# Patient Record
Sex: Female | Born: 1985
Health system: Southern US, Community
[De-identification: ages and names within clinical notes are randomized; demographics above are authoritative.]

## PROBLEM LIST (undated history)

## (undated) DIAGNOSIS — R000 Tachycardia, unspecified: Secondary | ICD-10-CM

## (undated) DIAGNOSIS — K219 Gastro-esophageal reflux disease without esophagitis: Secondary | ICD-10-CM

## (undated) DIAGNOSIS — L309 Dermatitis, unspecified: Secondary | ICD-10-CM

## (undated) DIAGNOSIS — D649 Anemia, unspecified: Secondary | ICD-10-CM

## (undated) DIAGNOSIS — I471 Supraventricular tachycardia, unspecified: Secondary | ICD-10-CM

## (undated) DIAGNOSIS — F32A Depression, unspecified: Secondary | ICD-10-CM

## (undated) DIAGNOSIS — N390 Urinary tract infection, site not specified: Secondary | ICD-10-CM

## (undated) DIAGNOSIS — K760 Fatty (change of) liver, not elsewhere classified: Secondary | ICD-10-CM

## (undated) DIAGNOSIS — O210 Mild hyperemesis gravidarum: Secondary | ICD-10-CM

## (undated) DIAGNOSIS — R5383 Other fatigue: Secondary | ICD-10-CM

## (undated) DIAGNOSIS — Z8781 Personal history of (healed) traumatic fracture: Secondary | ICD-10-CM

## (undated) DIAGNOSIS — F419 Anxiety disorder, unspecified: Secondary | ICD-10-CM

## (undated) DIAGNOSIS — F329 Major depressive disorder, single episode, unspecified: Secondary | ICD-10-CM

## (undated) HISTORY — PX: VEIN SURGERY: SHX48

## (undated) HISTORY — DX: Anxiety disorder, unspecified: F41.9

## (undated) HISTORY — PX: WISDOM TOOTH EXTRACTION: SHX21

## (undated) HISTORY — DX: Personal history of (healed) traumatic fracture: Z87.81

## (undated) HISTORY — PX: CHOLECYSTECTOMY: SHX55

## (undated) HISTORY — DX: Dermatitis, unspecified: L30.9

## (undated) HISTORY — DX: Mild hyperemesis gravidarum: O21.0

---

## 2006-07-19 ENCOUNTER — Ambulatory Visit (HOSPITAL_COMMUNITY): Admission: RE | Admit: 2006-07-19 | Discharge: 2006-07-19 | Payer: Self-pay | Admitting: Gynecology

## 2006-12-09 ENCOUNTER — Ambulatory Visit (HOSPITAL_COMMUNITY): Admission: RE | Admit: 2006-12-09 | Discharge: 2006-12-09 | Payer: Self-pay | Admitting: Family Medicine

## 2006-12-24 ENCOUNTER — Ambulatory Visit: Payer: Self-pay | Admitting: Obstetrics and Gynecology

## 2006-12-27 ENCOUNTER — Ambulatory Visit: Payer: Self-pay | Admitting: Physician Assistant

## 2006-12-27 ENCOUNTER — Inpatient Hospital Stay (HOSPITAL_COMMUNITY): Admission: AD | Admit: 2006-12-27 | Discharge: 2006-12-29 | Payer: Self-pay | Admitting: Obstetrics & Gynecology

## 2007-06-30 ENCOUNTER — Emergency Department (HOSPITAL_COMMUNITY): Admission: EM | Admit: 2007-06-30 | Discharge: 2007-06-30 | Payer: Self-pay | Admitting: Emergency Medicine

## 2007-07-29 ENCOUNTER — Emergency Department (HOSPITAL_COMMUNITY): Admission: EM | Admit: 2007-07-29 | Discharge: 2007-07-29 | Payer: Self-pay | Admitting: Emergency Medicine

## 2008-08-24 ENCOUNTER — Emergency Department (HOSPITAL_BASED_OUTPATIENT_CLINIC_OR_DEPARTMENT_OTHER): Admission: EM | Admit: 2008-08-24 | Discharge: 2008-08-24 | Payer: Self-pay | Admitting: Emergency Medicine

## 2010-06-25 ENCOUNTER — Inpatient Hospital Stay (INDEPENDENT_AMBULATORY_CARE_PROVIDER_SITE_OTHER)
Admission: RE | Admit: 2010-06-25 | Discharge: 2010-06-25 | Disposition: A | Discharge status: Home / Self Care | Payer: Medicaid Other | Source: Ambulatory Visit | Attending: Family Medicine | Admitting: Family Medicine

## 2010-06-25 DIAGNOSIS — B2790 Infectious mononucleosis, unspecified without complication: Secondary | ICD-10-CM

## 2010-08-13 LAB — GLUCOSE, CAPILLARY: Glucose-Capillary: 89 mg/dL (ref 70–99)

## 2010-09-08 ENCOUNTER — Other Ambulatory Visit: Payer: Self-pay | Admitting: Family Medicine

## 2010-09-08 ENCOUNTER — Ambulatory Visit
Admission: RE | Admit: 2010-09-08 | Discharge: 2010-09-08 | Disposition: A | Discharge status: Home / Self Care | Payer: Medicaid Other | Source: Ambulatory Visit | Attending: Family Medicine | Admitting: Family Medicine

## 2010-09-08 DIAGNOSIS — R52 Pain, unspecified: Secondary | ICD-10-CM

## 2010-10-03 DIAGNOSIS — Z8781 Personal history of (healed) traumatic fracture: Secondary | ICD-10-CM

## 2010-10-03 HISTORY — DX: Personal history of (healed) traumatic fracture: Z87.81

## 2011-01-23 LAB — BASIC METABOLIC PANEL
CO2: 27
Calcium: 10
Glucose, Bld: 88
Potassium: 4.5
Sodium: 140

## 2011-01-23 LAB — CBC
HCT: 40.9
Hemoglobin: 13.9
MCHC: 34
RDW: 13.2

## 2011-01-23 LAB — DIFFERENTIAL
Basophils Absolute: 0.1
Basophils Relative: 1
Eosinophils Absolute: 0.3
Eosinophils Relative: 4
Monocytes Absolute: 0.7

## 2011-01-26 LAB — D-DIMER, QUANTITATIVE

## 2011-02-06 LAB — ABO/RH

## 2011-02-06 LAB — GC/CHLAMYDIA PROBE AMP, GENITAL
Chlamydia: NEGATIVE
Gonorrhea: NEGATIVE

## 2011-02-06 LAB — HIV ANTIBODY (ROUTINE TESTING W REFLEX): HIV: NONREACTIVE

## 2011-02-13 LAB — RH IMMUNE GLOB WKUP(>/=20WKS)(NOT WOMEN'S HOSP): Fetal Screen: NEGATIVE

## 2011-02-13 LAB — CBC
MCHC: 34.1
Platelets: 250
RDW: 14.4 — ABNORMAL HIGH
RDW: 15 — ABNORMAL HIGH

## 2011-02-13 LAB — RPR: RPR Ser Ql: NONREACTIVE

## 2011-02-13 LAB — CCBB MATERNAL DONOR DRAW

## 2011-02-13 LAB — RAPID HIV SCREEN (WH-MAU): Rapid HIV Screen: NONREACTIVE

## 2011-05-05 NOTE — L&D Delivery Note (Signed)
Delivery Note At 2:45 PM a viable female was delivered via Vaginal, Spontaneous Delivery (Presentation: Right Occiput Anterior).  APGARs and weight pending.   Placenta status: Intact, Spontaneous.  Cord:  with the following complications: None.  Anesthesia: Epidural  Episiotomy: None Lacerations: 1st degree Suture Repair: 3.0 vicryl rapide Est. Blood Loss (mL): 400  Mom to postpartum.  Baby to stay with mom.  Gunnar Hereford D 08/31/2011, 3:05 PM

## 2011-08-10 LAB — STREP B DNA PROBE: GBS: NEGATIVE

## 2011-08-20 ENCOUNTER — Encounter (HOSPITAL_COMMUNITY): Payer: Self-pay | Admitting: *Deleted

## 2011-08-20 ENCOUNTER — Telehealth (HOSPITAL_COMMUNITY): Payer: Self-pay | Admitting: *Deleted

## 2011-08-20 NOTE — Telephone Encounter (Signed)
Preadmission screen  

## 2011-08-22 ENCOUNTER — Inpatient Hospital Stay (HOSPITAL_COMMUNITY)
Admission: AD | Admit: 2011-08-22 | Discharge: 2011-08-22 | Disposition: A | Discharge status: Home / Self Care | Payer: Self-pay | Source: Ambulatory Visit | Attending: Obstetrics and Gynecology | Admitting: Obstetrics and Gynecology

## 2011-08-22 ENCOUNTER — Encounter (HOSPITAL_COMMUNITY): Payer: Self-pay

## 2011-08-22 DIAGNOSIS — O479 False labor, unspecified: Secondary | ICD-10-CM | POA: Insufficient documentation

## 2011-08-22 NOTE — Progress Notes (Signed)
Dr. Senaida Ores notified of patient arrival for labor eval. Pt was seen in the office and cervical exam was 1.5cm. Pts cervix is unchanged 1.5 cm, 50, -3. Orders received to dc patient home.

## 2011-08-22 NOTE — MAU Note (Signed)
Irregular contractions since today, regular since 4:00 every 5 minutes, thinks lost mucus plug, no vaginal bleeding no leaking of fluid.  38 weeks. G2P1

## 2011-08-31 ENCOUNTER — Encounter (HOSPITAL_COMMUNITY): Payer: Self-pay

## 2011-08-31 ENCOUNTER — Inpatient Hospital Stay (HOSPITAL_COMMUNITY)
Admission: RE | Admit: 2011-08-31 | Discharge: 2011-09-02 | DRG: 775 | Disposition: A | Discharge status: Home / Self Care | Payer: Managed Care, Other (non HMO) | Source: Ambulatory Visit | Attending: Obstetrics and Gynecology | Admitting: Obstetrics and Gynecology

## 2011-08-31 ENCOUNTER — Encounter (HOSPITAL_COMMUNITY): Payer: Self-pay | Admitting: Anesthesiology

## 2011-08-31 ENCOUNTER — Inpatient Hospital Stay (HOSPITAL_COMMUNITY): Payer: Managed Care, Other (non HMO) | Admitting: Anesthesiology

## 2011-08-31 VITALS — BP 110/69 | HR 87 | Temp 98.2°F | Resp 18 | Ht 67.0 in | Wt 208.0 lb

## 2011-08-31 DIAGNOSIS — Z3490 Encounter for supervision of normal pregnancy, unspecified, unspecified trimester: Secondary | ICD-10-CM | POA: Diagnosis present

## 2011-08-31 DIAGNOSIS — F411 Generalized anxiety disorder: Secondary | ICD-10-CM | POA: Diagnosis present

## 2011-08-31 DIAGNOSIS — O99344 Other mental disorders complicating childbirth: Secondary | ICD-10-CM | POA: Diagnosis present

## 2011-08-31 LAB — CBC
HCT: 29.9 % — ABNORMAL LOW (ref 36.0–46.0)
Hemoglobin: 9.3 g/dL — ABNORMAL LOW (ref 12.0–15.0)
MCH: 26.6 pg (ref 26.0–34.0)
MCHC: 31.1 g/dL (ref 30.0–36.0)
MCV: 85.4 fL (ref 78.0–100.0)

## 2011-08-31 MED ORDER — WITCH HAZEL-GLYCERIN EX PADS: 1.0000 "application " | MEDICATED_PAD | CUTANEOUS | Status: DC | PRN

## 2011-08-31 MED ORDER — OXYTOCIN 20 UNITS IN LACTATED RINGERS INFUSION - SIMPLE: 125.0000 mL/h | INTRAVENOUS | Status: DC | PRN

## 2011-08-31 MED ORDER — ONDANSETRON HCL 4 MG/2ML IJ SOLN: 4.0000 mg | Freq: Four times a day (QID) | INTRAMUSCULAR | Status: DC | PRN

## 2011-08-31 MED ORDER — LACTATED RINGERS IV SOLN
INTRAVENOUS | Status: DC
  Administered 2011-08-31: 09:00:00 via INTRAVENOUS

## 2011-08-31 MED ORDER — TERBUTALINE SULFATE 1 MG/ML IJ SOLN: 0.2500 mg | Freq: Once | INTRAMUSCULAR | Status: DC | PRN

## 2011-08-31 MED ORDER — METHYLERGONOVINE MALEATE 0.2 MG PO TABS: 0.2000 mg | ORAL_TABLET | ORAL | Status: DC | PRN

## 2011-08-31 MED ORDER — FENTANYL 2.5 MCG/ML BUPIVACAINE 1/10 % EPIDURAL INFUSION (WH - ANES)
INTRAMUSCULAR | Status: DC | PRN
  Administered 2011-08-31: 14 mL/h via EPIDURAL

## 2011-08-31 MED ORDER — ONDANSETRON HCL 4 MG PO TABS: 4.0000 mg | ORAL_TABLET | ORAL | Status: DC | PRN

## 2011-08-31 MED ORDER — DIPHENHYDRAMINE HCL 25 MG PO CAPS: 25.0000 mg | ORAL_CAPSULE | Freq: Four times a day (QID) | ORAL | Status: DC | PRN

## 2011-08-31 MED ORDER — ONDANSETRON HCL 4 MG/2ML IJ SOLN: 4.0000 mg | INTRAMUSCULAR | Status: DC | PRN

## 2011-08-31 MED ORDER — PRENATAL MULTIVITAMIN CH
1.0000 | ORAL_TABLET | Freq: Every day | ORAL | Status: DC
  Administered 2011-09-01 – 2011-09-02 (×2): 1 via ORAL
  Filled 2011-08-31 (×2): qty 1

## 2011-08-31 MED ORDER — OXYTOCIN 20 UNITS IN LACTATED RINGERS INFUSION - SIMPLE
1.0000 m[IU]/min | INTRAVENOUS | Status: DC
  Administered 2011-08-31: 6 m[IU]/min via INTRAVENOUS
  Filled 2011-08-31: qty 1000

## 2011-08-31 MED ORDER — OXYTOCIN 20 UNITS IN LACTATED RINGERS INFUSION - SIMPLE: 125.0000 mL/h | Freq: Once | INTRAVENOUS | Status: DC

## 2011-08-31 MED ORDER — PHENYLEPHRINE 40 MCG/ML (10ML) SYRINGE FOR IV PUSH (FOR BLOOD PRESSURE SUPPORT)
80.0000 ug | PREFILLED_SYRINGE | INTRAVENOUS | Status: DC | PRN
  Filled 2011-08-31: qty 5

## 2011-08-31 MED ORDER — TETANUS-DIPHTH-ACELL PERTUSSIS 5-2.5-18.5 LF-MCG/0.5 IM SUSP: 0.5000 mL | Freq: Once | INTRAMUSCULAR | Status: DC

## 2011-08-31 MED ORDER — MEASLES, MUMPS & RUBELLA VAC ~~LOC~~ INJ: 0.5000 mL | INJECTION | Freq: Once | SUBCUTANEOUS | Status: DC

## 2011-08-31 MED ORDER — PHENYLEPHRINE 40 MCG/ML (10ML) SYRINGE FOR IV PUSH (FOR BLOOD PRESSURE SUPPORT): 80.0000 ug | PREFILLED_SYRINGE | INTRAVENOUS | Status: DC | PRN

## 2011-08-31 MED ORDER — BENZOCAINE-MENTHOL 20-0.5 % EX AERO
1.0000 "application " | INHALATION_SPRAY | CUTANEOUS | Status: DC | PRN
  Administered 2011-08-31 – 2011-09-01 (×2): 1 via TOPICAL
  Filled 2011-08-31 (×2): qty 56

## 2011-08-31 MED ORDER — EPHEDRINE 5 MG/ML INJ: 10.0000 mg | INTRAVENOUS | Status: DC | PRN

## 2011-08-31 MED ORDER — SIMETHICONE 80 MG PO CHEW: 80.0000 mg | CHEWABLE_TABLET | ORAL | Status: DC | PRN

## 2011-08-31 MED ORDER — LACTATED RINGERS IV SOLN: 500.0000 mL | INTRAVENOUS | Status: DC | PRN

## 2011-08-31 MED ORDER — LANOLIN HYDROUS EX OINT: TOPICAL_OINTMENT | CUTANEOUS | Status: DC | PRN

## 2011-08-31 MED ORDER — IBUPROFEN 600 MG PO TABS
600.0000 mg | ORAL_TABLET | Freq: Four times a day (QID) | ORAL | Status: DC
  Administered 2011-08-31 – 2011-09-02 (×6): 600 mg via ORAL
  Filled 2011-08-31 (×7): qty 1

## 2011-08-31 MED ORDER — LIDOCAINE HCL (PF) 1 % IJ SOLN
INTRAMUSCULAR | Status: DC | PRN
  Administered 2011-08-31 (×2): 4 mL

## 2011-08-31 MED ORDER — DIBUCAINE 1 % RE OINT: 1.0000 "application " | TOPICAL_OINTMENT | RECTAL | Status: DC | PRN

## 2011-08-31 MED ORDER — EPHEDRINE 5 MG/ML INJ
10.0000 mg | INTRAVENOUS | Status: DC | PRN
  Filled 2011-08-31: qty 4

## 2011-08-31 MED ORDER — OXYTOCIN BOLUS FROM INFUSION
500.0000 mL | Freq: Once | INTRAVENOUS | Status: DC
  Filled 2011-08-31: qty 500

## 2011-08-31 MED ORDER — CITRIC ACID-SODIUM CITRATE 334-500 MG/5ML PO SOLN: 30.0000 mL | ORAL | Status: DC | PRN

## 2011-08-31 MED ORDER — SERTRALINE HCL 100 MG PO TABS
100.0000 mg | ORAL_TABLET | Freq: Every day | ORAL | Status: DC
  Administered 2011-08-31: 100 mg via ORAL
  Filled 2011-08-31 (×2): qty 1

## 2011-08-31 MED ORDER — IBUPROFEN 600 MG PO TABS
600.0000 mg | ORAL_TABLET | Freq: Four times a day (QID) | ORAL | Status: DC | PRN
  Administered 2011-08-31: 600 mg via ORAL
  Filled 2011-08-31: qty 1

## 2011-08-31 MED ORDER — OXYCODONE-ACETAMINOPHEN 5-325 MG PO TABS
1.0000 | ORAL_TABLET | ORAL | Status: DC | PRN
  Administered 2011-08-31 – 2011-09-01 (×4): 1 via ORAL
  Administered 2011-09-02: 2 via ORAL
  Filled 2011-08-31 (×4): qty 1
  Filled 2011-08-31: qty 2

## 2011-08-31 MED ORDER — FENTANYL 2.5 MCG/ML BUPIVACAINE 1/10 % EPIDURAL INFUSION (WH - ANES)
14.0000 mL/h | INTRAMUSCULAR | Status: DC
  Administered 2011-08-31: 14 mL/h via EPIDURAL
  Filled 2011-08-31 (×2): qty 60

## 2011-08-31 MED ORDER — ACETAMINOPHEN 325 MG PO TABS: 650.0000 mg | ORAL_TABLET | ORAL | Status: DC | PRN

## 2011-08-31 MED ORDER — ZOLPIDEM TARTRATE 5 MG PO TABS
5.0000 mg | ORAL_TABLET | Freq: Every evening | ORAL | Status: DC | PRN
Start: 2011-08-31 — End: 2011-09-02

## 2011-08-31 MED ORDER — DIPHENHYDRAMINE HCL 50 MG/ML IJ SOLN: 12.5000 mg | INTRAMUSCULAR | Status: DC | PRN

## 2011-08-31 MED ORDER — SERTRALINE HCL 100 MG PO TABS
100.0000 mg | ORAL_TABLET | Freq: Every day | ORAL | Status: DC
  Administered 2011-09-01 – 2011-09-02 (×2): 100 mg via ORAL
  Filled 2011-08-31 (×3): qty 1

## 2011-08-31 MED ORDER — METHYLERGONOVINE MALEATE 0.2 MG/ML IJ SOLN: 0.2000 mg | INTRAMUSCULAR | Status: DC | PRN

## 2011-08-31 MED ORDER — OXYCODONE-ACETAMINOPHEN 5-325 MG PO TABS: 1.0000 | ORAL_TABLET | ORAL | Status: DC | PRN

## 2011-08-31 MED ORDER — MAGNESIUM HYDROXIDE 400 MG/5ML PO SUSP: 30.0000 mL | ORAL | Status: DC | PRN

## 2011-08-31 MED ORDER — LIDOCAINE HCL (PF) 1 % IJ SOLN: 30.0000 mL | INTRAMUSCULAR | Status: DC | PRN

## 2011-08-31 MED ORDER — LACTATED RINGERS IV SOLN: 500.0000 mL | Freq: Once | INTRAVENOUS | Status: DC

## 2011-08-31 MED ORDER — SENNOSIDES-DOCUSATE SODIUM 8.6-50 MG PO TABS
2.0000 | ORAL_TABLET | Freq: Every day | ORAL | Status: DC
  Administered 2011-08-31 – 2011-09-01 (×2): 2 via ORAL

## 2011-08-31 NOTE — Anesthesia Procedure Notes (Signed)
Epidural Patient location during procedure: OB Start time: 08/31/2011 10:12 AM  Staffing Anesthesiologist: Kamdyn Covel A. Performed by: anesthesiologist   Preanesthetic Checklist Completed: patient identified, site marked, surgical consent, pre-op evaluation, timeout performed, IV checked, risks and benefits discussed and monitors and equipment checked  Epidural Patient position: sitting Prep: site prepped and draped and DuraPrep Patient monitoring: continuous pulse ox and blood pressure Approach: midline Injection technique: LOR air  Needle:  Needle type: Tuohy  Needle gauge: 17 G Needle length: 9 cm Needle insertion depth: 5 cm cm Catheter type: closed end flexible Catheter size: 19 Gauge Catheter at skin depth: 10 cm Test dose: negative and Other  Assessment Events: blood not aspirated, injection not painful, no injection resistance, negative IV test and no paresthesia  Additional Notes Patient identified. Risks and benefits discussed including failed block, incomplete  Pain control, post dural puncture headache, nerve damage, paralysis, blood pressure Changes, nausea, vomiting, reactions to medications-both toxic and allergic and post Partum back pain. All questions were answered. Patient expressed understanding and wished to proceed. Sterile technique was used throughout procedure. Epidural site was Dressed with sterile barrier dressing. No paresthesias, signs of intravascular injection Or signs of intrathecal spread were encountered.  Patient was more comfortable after the epidural was dosed. Please see RN's note for documentation of vital signs and FHR which are stable.

## 2011-08-31 NOTE — Progress Notes (Signed)
Comfortable with epidural Afeb, VSS FHT- Cat I, ctx q 2-3 min VE- 6/80/-1, vtx Continue pitocin, anticipate SVD
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
## 2011-08-31 NOTE — Anesthesia Preprocedure Evaluation (Signed)

## 2011-09-01 NOTE — Progress Notes (Signed)
PPD #1 No problems Afeb, VSS Fundus firm, NT at U-0 Continue routine postpartum care, wants to go home later today if baby can go

## 2011-09-01 NOTE — Progress Notes (Signed)
UR chart review completed.  

## 2011-09-02 MED ORDER — IBUPROFEN 600 MG PO TABS: 600.0000 mg | ORAL_TABLET | Freq: Four times a day (QID) | ORAL | Status: AC

## 2011-09-02 MED ORDER — OXYCODONE-ACETAMINOPHEN 5-325 MG PO TABS: 1.0000 | ORAL_TABLET | ORAL | Status: AC | PRN

## 2011-09-02 NOTE — Discharge Summary (Signed)
Obstetric Discharge Summary Reason for Admission: induction of labor Prenatal Procedures: none Intrapartum Procedures: spontaneous vaginal delivery Postpartum Procedures: none Complications-Operative and Postpartum: 1st degree perineal laceration Hemoglobin  Date Value Range Status  08/31/2011 9.3* 12.0-15.0 (g/dL) Final     HCT  Date Value Range Status  08/31/2011 29.9* 36.0-46.0 (%) Final    Physical Exam:  General: alert Lochia: appropriate Uterine Fundus: firm  Discharge Diagnoses: Term Pregnancy-delivered  Discharge Information: Date: 09/02/2011 Activity: pelvic rest Diet: routine Medications: Ibuprofen and Percocet Condition: stable Instructions: refer to practice specific booklet Discharge to: home Follow-up Information    Follow up with Reeder Brisby D, MD. Schedule an appointment as soon as possible for a visit in 6 weeks.   Contact information:   [address] [place] [address] [phone]          Newborn Data: Live born female  Birth Weight: 7 lb 2 oz (3232 g) APGAR: 9, 9  Home with mother.  Raylynne Cubbage D 09/02/2011, 8:50 AM

## 2011-09-02 NOTE — Discharge Instructions (Signed)
As per discharge pamphlet °

## 2011-09-02 NOTE — Anesthesia Postprocedure Evaluation (Signed)
  Anesthesia Post-op Note  Patient: Christina [name]  Procedure(s) Performed: * Lumbar Epidural for L&D*  Patient Location: Mother/Baby  Anesthesia Type: Epidural  Level of Consciousness: awake, alert  and oriented  Airway and Oxygen Therapy: Patient Spontanous Breathing  Post-op Pain: none  Post-op Assessment: Post-op Vital signs reviewed, Patient's Cardiovascular Status Stable, Respiratory Function Stable, Patent Airway, No signs of Nausea or vomiting, Adequate PO intake, Pain level controlled, No headache, No backache, No residual numbness and No residual motor weakness  Post-op Vital Signs: Reviewed and stable  Complications: No apparent anesthesia complications

## 2011-09-02 NOTE — Progress Notes (Signed)
PPD #2 No problems Afeb, VSS Fundus firm, NT at U-1 D/c home 
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
## 2012-06-06 ENCOUNTER — Encounter (HOSPITAL_COMMUNITY): Payer: Self-pay | Admitting: Pharmacist

## 2012-06-14 ENCOUNTER — Encounter (HOSPITAL_COMMUNITY): Payer: Self-pay

## 2012-06-14 ENCOUNTER — Encounter (HOSPITAL_COMMUNITY)
Admission: RE | Admit: 2012-06-14 | Discharge: 2012-06-14 | Disposition: A | Discharge status: Home / Self Care | Payer: BC Managed Care – PPO | Source: Ambulatory Visit | Attending: Obstetrics and Gynecology | Admitting: Obstetrics and Gynecology

## 2012-06-14 HISTORY — DX: Gastro-esophageal reflux disease without esophagitis: K21.9

## 2012-06-14 LAB — CBC
HCT: 40.4 % (ref 36.0–46.0)
MCHC: 32.4 g/dL (ref 30.0–36.0)
MCV: 91 fL (ref 78.0–100.0)
RDW: 13.2 % (ref 11.5–15.5)

## 2012-06-14 NOTE — Patient Instructions (Addendum)
20 Christina [name]  06/14/2012   Your procedure is scheduled on:  06/20/12  Enter through the Main Entrance of [facility] at 6 AM.  Pick up the phone at the desk and dial [phone].   Call this number if you have problems the morning of surgery: [phone]   Remember:   Do not eat food:After Midnight.  Do not drink clear liquids: After Midnight.  Take these medicines the morning of surgery with A SIP OF WATER: NA   Do not wear jewelry, make-up or nail polish.  Do not wear lotions, powders, or perfumes. You may wear deodorant.  Do not shave 48 hours prior to surgery.  Do not bring valuables to the hospital.  Contacts, dentures or bridgework may not be worn into surgery.  Leave suitcase in the car. After surgery it may be brought to your room.  For patients admitted to the hospital, checkout time is 11:00 AM the day of discharge.   Patients discharged the day of surgery will not be allowed to drive home.  Name and phone number of your driver: husband   Christina [name]  Special Instructions: Shower using CHG 2 nights before surgery and the night before surgery.  If you shower the day of surgery use CHG.  Use special wash - you have one bottle of CHG for all showers.  You should use approximately 1/3 of the bottle for each shower.   Please read over the following fact sheets that you were given: MRSA Information

## 2012-06-14 NOTE — Pre-Procedure Instructions (Signed)
Patient has temporary crown on lower right.

## 2012-06-20 ENCOUNTER — Ambulatory Visit (HOSPITAL_COMMUNITY)
Admission: RE | Admit: 2012-06-20 | Discharge: 2012-06-20 | Disposition: A | Discharge status: Home / Self Care | Payer: BC Managed Care – PPO | Source: Ambulatory Visit | Attending: Obstetrics and Gynecology | Admitting: Obstetrics and Gynecology

## 2012-06-20 ENCOUNTER — Ambulatory Visit (HOSPITAL_COMMUNITY): Payer: BC Managed Care – PPO | Admitting: Anesthesiology

## 2012-06-20 ENCOUNTER — Encounter (HOSPITAL_COMMUNITY): Payer: Self-pay | Admitting: Anesthesiology

## 2012-06-20 ENCOUNTER — Encounter (HOSPITAL_COMMUNITY)
Admission: RE | Disposition: A | Discharge status: Home / Self Care | Payer: Self-pay | Source: Ambulatory Visit | Attending: Obstetrics and Gynecology

## 2012-06-20 DIAGNOSIS — N83209 Unspecified ovarian cyst, unspecified side: Secondary | ICD-10-CM | POA: Insufficient documentation

## 2012-06-20 DIAGNOSIS — N949 Unspecified condition associated with female genital organs and menstrual cycle: Secondary | ICD-10-CM | POA: Insufficient documentation

## 2012-06-20 HISTORY — PX: LAPAROSCOPY: SHX197

## 2012-06-20 LAB — HCG, SERUM, QUALITATIVE: Preg, Serum: NEGATIVE

## 2012-06-20 SURGERY — LAPAROSCOPY OPERATIVE
Anesthesia: General | Site: Abdomen | Laterality: Left | Wound class: Clean Contaminated

## 2012-06-20 MED ORDER — GLYCOPYRROLATE 0.2 MG/ML IJ SOLN
INTRAMUSCULAR | Status: AC
  Filled 2012-06-20: qty 4

## 2012-06-20 MED ORDER — FENTANYL CITRATE 0.05 MG/ML IJ SOLN
INTRAMUSCULAR | Status: AC
  Filled 2012-06-20: qty 2

## 2012-06-20 MED ORDER — BUPIVACAINE HCL (PF) 0.25 % IJ SOLN
INTRAMUSCULAR | Status: AC
  Filled 2012-06-20: qty 30

## 2012-06-20 MED ORDER — FENTANYL CITRATE 0.05 MG/ML IJ SOLN
INTRAMUSCULAR | Status: DC | PRN
  Administered 2012-06-20: 100 ug via INTRAVENOUS
  Administered 2012-06-20 (×2): 50 ug via INTRAVENOUS

## 2012-06-20 MED ORDER — LIDOCAINE HCL (CARDIAC) 20 MG/ML IV SOLN
INTRAVENOUS | Status: AC
  Filled 2012-06-20: qty 5

## 2012-06-20 MED ORDER — PROPOFOL 10 MG/ML IV EMUL
INTRAVENOUS | Status: AC
  Filled 2012-06-20: qty 20

## 2012-06-20 MED ORDER — GLYCOPYRROLATE 0.2 MG/ML IJ SOLN
INTRAMUSCULAR | Status: DC | PRN
  Administered 2012-06-20: .8 mg via INTRAVENOUS

## 2012-06-20 MED ORDER — PHENYLEPHRINE HCL 10 MG/ML IJ SOLN
INTRAMUSCULAR | Status: DC | PRN
  Administered 2012-06-20: 80 ug via INTRAVENOUS

## 2012-06-20 MED ORDER — MIDAZOLAM HCL 2 MG/2ML IJ SOLN
INTRAMUSCULAR | Status: AC
  Filled 2012-06-20: qty 2

## 2012-06-20 MED ORDER — NEOSTIGMINE METHYLSULFATE 1 MG/ML IJ SOLN
INTRAMUSCULAR | Status: DC | PRN
  Administered 2012-06-20: 4 mg via INTRAVENOUS

## 2012-06-20 MED ORDER — ROCURONIUM BROMIDE 100 MG/10ML IV SOLN
INTRAVENOUS | Status: DC | PRN
  Administered 2012-06-20: 10 mg via INTRAVENOUS
  Administered 2012-06-20: 40 mg via INTRAVENOUS

## 2012-06-20 MED ORDER — FENTANYL CITRATE 0.05 MG/ML IJ SOLN
25.0000 ug | INTRAMUSCULAR | Status: DC | PRN
  Administered 2012-06-20 (×3): 50 ug via INTRAVENOUS

## 2012-06-20 MED ORDER — LACTATED RINGERS IV SOLN
INTRAVENOUS | Status: DC
  Administered 2012-06-20: 07:00:00 via INTRAVENOUS

## 2012-06-20 MED ORDER — PHENYLEPHRINE 40 MCG/ML (10ML) SYRINGE FOR IV PUSH (FOR BLOOD PRESSURE SUPPORT)
PREFILLED_SYRINGE | INTRAVENOUS | Status: AC
  Filled 2012-06-20: qty 5

## 2012-06-20 MED ORDER — OXYCODONE-ACETAMINOPHEN 5-325 MG PO TABS: 1.0000 | ORAL_TABLET | ORAL | Status: DC | PRN

## 2012-06-20 MED ORDER — ONDANSETRON HCL 4 MG/2ML IJ SOLN
INTRAMUSCULAR | Status: DC | PRN
  Administered 2012-06-20: 4 mg via INTRAVENOUS

## 2012-06-20 MED ORDER — ONDANSETRON HCL 4 MG/2ML IJ SOLN
INTRAMUSCULAR | Status: AC
  Filled 2012-06-20: qty 2

## 2012-06-20 MED ORDER — BUPIVACAINE HCL (PF) 0.25 % IJ SOLN
INTRAMUSCULAR | Status: DC | PRN
  Administered 2012-06-20: 10 mL

## 2012-06-20 MED ORDER — INDIGOTINDISULFONATE SODIUM 8 MG/ML IJ SOLN
INTRAMUSCULAR | Status: AC
  Filled 2012-06-20: qty 5

## 2012-06-20 MED ORDER — FENTANYL CITRATE 0.05 MG/ML IJ SOLN
INTRAMUSCULAR | Status: AC
  Filled 2012-06-20: qty 5

## 2012-06-20 MED ORDER — DEXAMETHASONE SODIUM PHOSPHATE 10 MG/ML IJ SOLN
INTRAMUSCULAR | Status: AC
  Filled 2012-06-20: qty 1

## 2012-06-20 MED ORDER — DEXAMETHASONE SODIUM PHOSPHATE 4 MG/ML IJ SOLN
INTRAMUSCULAR | Status: DC | PRN
  Administered 2012-06-20: 10 mg via INTRAVENOUS

## 2012-06-20 MED ORDER — NEOSTIGMINE METHYLSULFATE 1 MG/ML IJ SOLN
INTRAMUSCULAR | Status: AC
  Filled 2012-06-20: qty 1

## 2012-06-20 MED ORDER — KETOROLAC TROMETHAMINE 30 MG/ML IJ SOLN
INTRAMUSCULAR | Status: AC
Start: 2012-06-20 — End: 2012-06-20
  Filled 2012-06-20: qty 1

## 2012-06-20 MED ORDER — LIDOCAINE HCL (CARDIAC) 20 MG/ML IV SOLN
INTRAVENOUS | Status: DC | PRN
  Administered 2012-06-20: 80 mg via INTRAVENOUS

## 2012-06-20 MED ORDER — PROPOFOL 10 MG/ML IV BOLUS
INTRAVENOUS | Status: DC | PRN
  Administered 2012-06-20: 150 mg via INTRAVENOUS

## 2012-06-20 MED ORDER — MIDAZOLAM HCL 5 MG/5ML IJ SOLN
INTRAMUSCULAR | Status: DC | PRN
  Administered 2012-06-20: 2 mg via INTRAVENOUS

## 2012-06-20 MED ORDER — LACTATED RINGERS IV SOLN
INTRAVENOUS | Status: DC
  Administered 2012-06-20 (×3): via INTRAVENOUS

## 2012-06-20 MED ORDER — KETOROLAC TROMETHAMINE 30 MG/ML IJ SOLN
INTRAMUSCULAR | Status: DC | PRN
  Administered 2012-06-20: 30 mg via INTRAVENOUS

## 2012-06-20 SURGICAL SUPPLY — 31 items
ADH SKN CLS APL DERMABOND .7 (GAUZE/BANDAGES/DRESSINGS) ×1
BAG SPEC RTRVL LRG 6X4 10 (ENDOMECHANICALS)
CABLE HIGH FREQUENCY MONO STRZ (ELECTRODE) ×1 IMPLANT
CATH ROBINSON RED A/P 16FR (CATHETERS) IMPLANT
CHLORAPREP W/TINT 26ML (MISCELLANEOUS) ×2 IMPLANT
CLOTH BEACON ORANGE TIMEOUT ST (SAFETY) ×2 IMPLANT
DECANTER SPIKE VIAL GLASS SM (MISCELLANEOUS) ×2 IMPLANT
DERMABOND ADVANCED (GAUZE/BANDAGES/DRESSINGS) ×1
DERMABOND ADVANCED .7 DNX12 (GAUZE/BANDAGES/DRESSINGS) ×1 IMPLANT
GLOVE BIO SURGEON STRL SZ8 (GLOVE) ×1 IMPLANT
GLOVE ORTHO TXT STRL SZ7.5 (GLOVE) ×1 IMPLANT
GOWN PREVENTION PLUS LG XLONG (DISPOSABLE) ×6 IMPLANT
NDL EPID 17G 5 ECHO TUOHY (NEEDLE) IMPLANT
NEEDLE EPID 17G 5 ECHO TUOHY (NEEDLE) IMPLANT
NEEDLE INSUFFLATION 120MM (ENDOMECHANICALS) ×2 IMPLANT
NS IRRIG 1000ML POUR BTL (IV SOLUTION) ×1 IMPLANT
PACK LAPAROSCOPY BASIN (CUSTOM PROCEDURE TRAY) ×2 IMPLANT
POUCH SPECIMEN RETRIEVAL 10MM (ENDOMECHANICALS) IMPLANT
PROTECTOR NERVE ULNAR (MISCELLANEOUS) ×2 IMPLANT
SCALPEL HARMONIC ACE (MISCELLANEOUS) IMPLANT
SET IRRIG TUBING LAPAROSCOPIC (IRRIGATION / IRRIGATOR) IMPLANT
SLEEVE Z-THREAD 5X100MM (TROCAR) ×1 IMPLANT
SOLUTION ELECTROLUBE (MISCELLANEOUS) IMPLANT
SUT VICRYL 0 UR6 27IN ABS (SUTURE) ×3 IMPLANT
SUT VICRYL 4-0 PS2 18IN ABS (SUTURE) ×2 IMPLANT
TOWEL OR 17X24 6PK STRL BLUE (TOWEL DISPOSABLE) ×4 IMPLANT
TRAY FOLEY CATH 14FR (SET/KITS/TRAYS/PACK) ×1 IMPLANT
TROCAR XCEL NON-BLD 11X100MML (ENDOMECHANICALS) IMPLANT
TROCAR XCEL NON-BLD 5MMX100MML (ENDOMECHANICALS) ×2 IMPLANT
WARMER LAPAROSCOPE (MISCELLANEOUS) ×2 IMPLANT
WATER STERILE IRR 1000ML POUR (IV SOLUTION) ×2 IMPLANT

## 2012-06-20 NOTE — Op Note (Signed)
Preoperative diagnosis: Pelvic pain and left ovarian cyst Postoperative diagnosis: Same Procedure: Open laparoscopy with removal of portion of the left ovarian cyst wall Surgeon: Lavina Hamman M.D. Anesthesia: Gen. Endotracheal tube Findings: She had a normal abdomen and pelvis. Uterus and right tube and ovary were normal. Left ovary was enlarged with what appeared to be a simple cyst. The cyst drained clear yellow fluid and the inner portion of the cyst looked normal. She had no other obvious source for pelvic pain. Estimated blood loss: Minimal Specimens: Portion of left ovarian cyst wall Complications: None  Procedure in detail:  The patient was taken to the operating room and placed in the dorsosupine position. General anesthesia was induced and left arm was tucked her side. Legs were placed in mobile stirrups. Abdomen, perineum and vagina were then prepped and draped in the usual sterile fashion, bladder drained with a latex free catheter, Hulka tenaculum applied to the cervix for uterine manipulation once her pregnancy test was found to be negative. Her IUD appeared to be in normal position. Infraumbilical skin was then infiltrated with quarter percent Marcaine and a 3 cm horizontal incision was made. The fascia was grasped and elevated and entered sharply with scissors. Peritoneal cavity was entered bluntly. A pursestring suture of 0 Vicryl was placed around the fascia and the Hassan cannula was inserted and secured. CO2 was insufflated. Inspection with the laparoscope revealed good placement. Inspection of the pelvis revealed the above-mentioned findings. Using monopolar scissors an incision was made into the left ovarian cyst wall. The cyst draining clear yellow fluid. Using the scissors a 2 cm portion of the ovarian cyst wall was removed to prevent the cyst from reforming. This was hemostatic and again no other source for her pain was identified. A 5 mm port had been placed on the left side  under direct visualization to aid in manipulation. This trocar was removed at the end of the case also under direct visualization. The piece of the ovarian cyst was removed through the umbilical trocar as it was removed. All gas allowed to deflate from the abdomen as the umbilical trocar was removed. The previously placed pursestring suture was tied. This achieved good fascial closure. Skin incisions were closed with interrupted subcuticular sutures of 4-0 Vicryl followed by Dermabond. At the end of the case the Hulka tenaculum was removed and some bleeding was controlled with pressure. The Foley catheter was also removed. She was awakened and taken to the recovery room in stable condition after tolerating the procedure well. Counts were correct and she had PAS hose on throughout the procedure.

## 2012-06-20 NOTE — Transfer of Care (Signed)
Immediate Anesthesia Transfer of Care Note  Patient: Christina [name]  Procedure(s) Performed: Procedure(s) with comments: LAPAROSCOPY OPERATIVE (Left) - OPEN LAPAROSCOPY, removal of portion of left ovarian cyst wall  Patient Location: PACU  Anesthesia Type:General  Level of Consciousness: awake, alert  and oriented  Airway & Oxygen Therapy: Patient Spontanous Breathing and Patient connected to nasal cannula oxygen  Post-op Assessment: Report given to PACU RN and Post -op Vital signs reviewed and stable  Post vital signs: Reviewed and stable  Complications: No apparent anesthesia complications 

## 2012-06-20 NOTE — H&P (Signed)
Christina [name] is an 27 y.o. female. She has been followed for a 4 cm left ovarian cyst which has remained stable in size over the past year or so.  However, she is having increasing pelvic pain, L>R, and wishes to have laparoscopy to evaluate her pain and remove this cyst.  Pertinent Gynecological History: Last pap: normal Date: 02-2011 OB History: G2, P2002 SVD at term x 2, no complications   Menstrual History: No LMP recorded.    Past Medical History  Diagnosis Date  . History of hip fracture 10/2010    still has pain, right hip  . Anxiety   . Eczema   . Hyperemesis arising during pregnancy   . Vaginitis and vulvovaginitis   . GERD (gastroesophageal reflux disease)     tums when needed    Past Surgical History  Procedure Laterality Date  . Wisdom tooth extraction      Family History  Problem Relation Age of Onset  . Alcohol abuse Mother   . Anxiety disorder Mother   . Asthma Mother   . Mental illness Mother     bipolar  . Depression Mother   . Hypothyroidism Mother   . Ovarian cysts Mother   . Hypertension Father   . Endometriosis Sister   . Ovarian cysts Sister   . Alcohol abuse Brother   . Crohn's disease Brother   . Alcohol abuse Maternal Aunt   . Cancer Maternal Aunt     breast  . Hypothyroidism Maternal Aunt   . Miscarriages / Stillbirths Maternal Aunt     stillbirth  . Alcohol abuse Maternal Uncle   . Asthma Maternal Grandmother   . Mental illness Maternal Grandmother     bipolar  . Asthma Maternal Grandfather   . Diabetes Maternal Grandfather   . Hypertension Maternal Grandfather   . Asthma Paternal Grandmother   . Hypertension Paternal Grandmother   . Asthma Paternal Grandfather   . Diabetes Paternal Grandfather   . Heart disease Paternal Grandfather   . Hypertension Paternal Grandfather     Social History:  reports that she has never smoked. She does not have any smokeless tobacco history on file. She reports that she does not drink alcohol  or use illicit drugs.  Allergies:  Allergies  Allergen Reactions  . Latex Other (See Comments)    Itchy skin in elementary school    Prescriptions prior to admission  Medication Sig Dispense Refill  . acetaminophen (TYLENOL) 325 MG tablet Take 650 mg by mouth every 6 (six) hours as needed. For headache/pain      . busPIRone (BUSPAR) 5 MG tablet Take 7.5 mg by mouth 2 (two) times daily.      Bertram Gala Glycol-Propyl Glycol (SYSTANE OP) Apply 1-2 drops to eye as needed. For dry eyes      . Prenatal Vit-Fe Fumarate-FA (PRENATAL MULTIVITAMIN) TABS Take 1 tablet by mouth at bedtime.      . sertraline (ZOLOFT) 100 MG tablet Take 200 mg by mouth daily.         Review of Systems  Respiratory: Negative.   Cardiovascular: Negative.   Gastrointestinal: Negative.   Genitourinary: Negative.     Blood pressure 121/71, pulse 93, temperature 98.2 F (36.8 C), temperature source Oral, resp. rate 18, SpO2 98.00%, currently breastfeeding. Physical Exam  Constitutional: She appears well-developed and well-nourished.  Neck: Neck supple. No thyromegaly present.  Cardiovascular: Normal rate, regular rhythm and normal heart sounds.   No murmur heard. Respiratory: Breath sounds  normal. She is in respiratory distress. She has no wheezes.  GI: Soft. She exhibits no distension and no mass. There is no tenderness.  Genitourinary: Vagina normal.  Uterus midplanar, nontender, normal size IUD strings at 2-3 cm Mild bilateral adnexal tenderness, 4 cm cyst palpable on left    No results found for this or any previous visit (from the past 24 hour(s)).  No results found.  Assessment/Plan: Persistent 4 cm left ovarian cyst with increasing pelvic pain.  Medical and surgical options have been discussed, including continued observation, she wishes to proceed with laparoscopy.  The procedure, risks, alternatives and chances of relieving her pain have all been discussed.  Will proceed with open laparoscopy with  probable left ovarian cystectomy and evaluation/treatment for pain.  Christina [name] 06/20/2012, 7:02 AM

## 2012-06-20 NOTE — Anesthesia Procedure Notes (Signed)
Procedure Name: Intubation Date/Time: 06/20/2012 7:37 AM Performed by: Louanna Vanliew, Jannet Askew Pre-anesthesia Checklist: Patient identified, Patient being monitored, Emergency Drugs available, Timeout performed and Suction available Patient Re-evaluated:Patient Re-evaluated prior to inductionOxygen Delivery Method: Circle system utilized Preoxygenation: Pre-oxygenation with 100% oxygen Intubation Type: IV induction Ventilation: Mask ventilation without difficulty Laryngoscope Size: Mac and 3 Grade View: Grade I Tube type: Oral Tube size: 7.0 mm Number of attempts: 1 Secured at: 21 cm Dental Injury: Teeth and Oropharynx as per pre-operative assessment

## 2012-06-20 NOTE — Anesthesia Postprocedure Evaluation (Signed)
  Anesthesia Post Note  Patient: Christina [name]  Procedure(s) Performed: Procedure(s) (LRB): LAPAROSCOPY OPERATIVE (Left)  Anesthesia type: GA  Patient location: PACU  Post pain: Pain level controlled  Post assessment: Post-op Vital signs reviewed  Last Vitals:  Filed Vitals:   06/20/12 0930  BP: 95/79  Pulse: 82  Temp: 36.7 C  Resp: 12    Post vital signs: Reviewed  Level of consciousness: sedated  Complications: No apparent anesthesia complications

## 2012-06-20 NOTE — Anesthesia Preprocedure Evaluation (Addendum)
Anesthesia Evaluation  Patient identified by MRN, date of birth, ID band Patient awake    Reviewed: Allergy & Precautions, H&P , Patient's Chart, lab work & pertinent test results, reviewed documented beta blocker date and time   Airway Mallampati: II TM Distance: >3 FB Neck ROM: full    Dental no notable dental hx.    Pulmonary  breath sounds clear to auscultation  Pulmonary exam normal       Cardiovascular Rhythm:regular Rate:Normal     Neuro/Psych    GI/Hepatic GERD-  Controlled,  Endo/Other    Renal/GU      Musculoskeletal   Abdominal   Peds  Hematology   Anesthesia Other Findings   Reproductive/Obstetrics                          Anesthesia Physical Anesthesia Plan  ASA: II  Anesthesia Plan: General   Post-op Pain Management:    Induction: Intravenous  Airway Management Planned: Oral ETT  Additional Equipment:   Intra-op Plan:   Post-operative Plan:   Informed Consent: I have reviewed the patients History and Physical, chart, labs and discussed the procedure including the risks, benefits and alternatives for the proposed anesthesia with the patient or authorized representative who has indicated his/her understanding and acceptance.   Dental Advisory Given and Dental advisory given  Plan Discussed with: CRNA and Surgeon  Anesthesia Plan Comments: (  Discussed  general anesthesia, including possible nausea, instrumentation of airway, sore throat,pulmonary aspiration, etc. I asked if the were any outstanding questions, or  concerns before we proceeded. )        Anesthesia Quick Evaluation

## 2012-06-20 NOTE — Transfer of Care (Signed)
Immediate Anesthesia Transfer of Care Note  Patient: Christina [name]  Procedure(s) Performed: Procedure(s) with comments: LAPAROSCOPY OPERATIVE (Left) - OPEN LAPAROSCOPY, removal of portion of left ovarian cyst wall  Patient Location: PACU  Anesthesia Type:General  Level of Consciousness: awake, alert  and oriented  Airway & Oxygen Therapy: Patient Spontanous Breathing and Patient connected to nasal cannula oxygen  Post-op Assessment: Report given to PACU RN and Post -op Vital signs reviewed and stable  Post vital signs: Reviewed and stable  Complications: No apparent anesthesia complications

## 2012-06-20 NOTE — Interval H&P Note (Signed)
History and Physical Interval Note:  06/20/2012 7:13 AM  Christina [name]  has presented today for surgery, with the diagnosis of L-ovarian cyst [id number]  The various methods of treatment have been discussed with the patient and family. After consideration of risks, benefits and other options for treatment, the patient has consented to  Procedure(s) with comments: LAPAROSCOPY OPERATIVE (N/A) - OPEN LAPAROSCOPY, POSSIBLE L-OVARIAN CYSTECTOMY as a surgical intervention .  The patient's history has been reviewed, patient examined, no change in status, stable for surgery.  I have reviewed the patient's chart and labs.  Questions were answered to the patient's satisfaction.     Teresa Lemmerman D

## 2012-06-21 ENCOUNTER — Encounter (HOSPITAL_COMMUNITY): Payer: Self-pay | Admitting: Obstetrics and Gynecology

## 2012-06-21 MED FILL — Heparin Sodium (Porcine) Inj 5000 Unit/ML: INTRAMUSCULAR | Qty: 1 | Status: AC

## 2012-08-12 ENCOUNTER — Ambulatory Visit: Payer: BC Managed Care – PPO | Admitting: Dietician

## 2012-08-31 ENCOUNTER — Encounter: Payer: BC Managed Care – PPO | Attending: Family Medicine | Admitting: *Deleted

## 2012-08-31 ENCOUNTER — Encounter: Payer: Self-pay | Admitting: *Deleted

## 2012-08-31 VITALS — Ht 67.0 in | Wt 210.0 lb

## 2012-08-31 DIAGNOSIS — E669 Obesity, unspecified: Secondary | ICD-10-CM | POA: Insufficient documentation

## 2012-08-31 DIAGNOSIS — Z713 Dietary counseling and surveillance: Secondary | ICD-10-CM | POA: Insufficient documentation

## 2012-08-31 NOTE — Patient Instructions (Addendum)
Aim to always eat at a table: either at the restaurant or at hone.  Do not eat while driving, riding, walking, standing, or in the bed. Aim to make meals last 20 minutes. Take smaller bites, chew your food, taste it and enjoy.  Evaluate fullness as you eat and aim to stop eating at pleasantly full, not over-full.  Do not feel like you have to clean your plate.  Save the rest.  Set a timer for 20 minutes    Try to stay hydrated.  Aim to drink water, flavored water, fruit water throughout the day.  So that way, if you have a "hunger sensations" you know that it actually is hunger.  Evaluate hunger sensations throughout the day.  Ask yourself if you're hungry before you reach for something  Try B vitamin.  Drink plenty of water.  Add fruits and vegetables wherever possible for energy.   Ask pediatrician about referral for [facility]

## 2012-08-31 NOTE — Progress Notes (Signed)
Medical Nutrition Therapy:  Appt start time: 1000 end time:  1100.  Assessment:  Primary concerns today: Jaziah is here for nutrition counseling for obesity.  She can't keep up with her 2  Young children.  She's tried Karie Mainland but not other diet pills.  Her son has multiple food allergies so the foods served at home are limited.  She struggles with portions and admits to overeating frequently.  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  Just stopped nursing and isn't as hungry as before Prior to pregnancy weighed 185 and delivered weighing 210.  Has not lost any weight since delivery.  Used to eat out a lot.  Doesn't use a lot of oil, butter, etc.  Loves sweets.  [place]uda requires 350 calorie snack prior to consumption.  Growing up was an average size. Moved out at age 20 and eating habits changed and gained a lot of weight.  Son is allergic to Eggs, fish, peanuts  MEDICATIONS: see list   DIETARY INTAKE:  Usual eating pattern includes 3 meals and 1-2 snacks per day.  24-hr recall:  B ( AM): usually cooks hot breakfast: 4 eggs, with ham, bacon, or sausage with cheese to split with husband.  But might have large bowl cereal with skim milk  Snk ( AM): maybe yogurt  L ( PM): leftovers or sandwich with chips and sugar-free koolaid or herbal tea with sugar Snk ( PM): yogurt or cheese or chips.  Maybe leftovers.  Maybe fast food D ( PM): spaghetti with meatballs.  Loves [place] food. Around 6-630 pm and goes to bed around 8:30-9 Snk ( PM): CIB and cheese immediately before bed Beverages: water with sugar-free mixer or herbal tea.  Sometimes juice, milk  Usual physical activity: none outside of ADLs.  Has exercise equipment at home, but doesn't use it  Estimated energy needs: 1600 calories 180 g carbohydrates 120 g protein 44 g fat  Progress Towards Goal(s):  In progress.   Nutritional Diagnosis:  [place]-3.3 Overweight/obesity As related to limited physical activity combined with limited  adherance to internal hunger and fullness cues.  As evidenced by BMI >30.    Intervention:  Nutrition counseling provided.  Encouraged patient to reject traditional diet mentality of "good" vs "bad" foods.  There are no good and bad foods, but rather food is fuel that we needs for our bodies.  When we don't get enough fuel, our bodies suffer the metabolic consequences.  Encouraged patient to eat whatever foods will satisfy them, regardless of their nutritional value.  We will discuss nutritional values of foods at a subsequent appointment.  Encouraged patient to honor their body's internal hunger and fullness cues.  Throughout the day, check in mentally and rate hunger.  Try not to eat when ravenous, but instead when slightly hungry.  Then choose food(s) that will be satisfying regardless of nutritional content.  Sit down to enjoy those foods.  Minimize distractions: turn off tv, put away books, work, Programmer, applications.  Make the meal last at least 20 minutes in order to give time to experience and register satiety.  Stop eating when full regardless of how much food is left on the plate.  Get more if still hungry.  The key is to honor fullness so throughout the meal, rate fullness factor and stop when comfortably full, but not stuffed.  Reminded patient that they can have any food they want, whenever they want, and however much they want.  Eventually the novelty  will wear out and each food will be equal in terms of its emotional appeal.  This will be a learning process and some days more food will be eaten, some days less.  The key is to honor hunger and fullness without any feelings of guilt.  Pay attention to what the internal cues are, rather than any external factors.   Monitoring/Evaluation:  Dietary intake, exercise, and body weight in 4-6 week(s).

## 2012-10-12 ENCOUNTER — Ambulatory Visit: Payer: BC Managed Care – PPO | Admitting: *Deleted

## 2013-04-20 ENCOUNTER — Encounter (HOSPITAL_COMMUNITY): Payer: Self-pay | Admitting: Emergency Medicine

## 2013-04-20 ENCOUNTER — Emergency Department (HOSPITAL_COMMUNITY)
Admission: EM | Admit: 2013-04-20 | Discharge: 2013-04-20 | Disposition: A | Discharge status: Home / Self Care | Payer: BC Managed Care – PPO | Attending: Emergency Medicine | Admitting: Emergency Medicine

## 2013-04-20 DIAGNOSIS — Z872 Personal history of diseases of the skin and subcutaneous tissue: Secondary | ICD-10-CM | POA: Insufficient documentation

## 2013-04-20 DIAGNOSIS — Z79899 Other long term (current) drug therapy: Secondary | ICD-10-CM | POA: Insufficient documentation

## 2013-04-20 DIAGNOSIS — Z3202 Encounter for pregnancy test, result negative: Secondary | ICD-10-CM | POA: Insufficient documentation

## 2013-04-20 DIAGNOSIS — R110 Nausea: Secondary | ICD-10-CM | POA: Insufficient documentation

## 2013-04-20 DIAGNOSIS — Z9889 Other specified postprocedural states: Secondary | ICD-10-CM | POA: Insufficient documentation

## 2013-04-20 DIAGNOSIS — Z8781 Personal history of (healed) traumatic fracture: Secondary | ICD-10-CM | POA: Insufficient documentation

## 2013-04-20 DIAGNOSIS — Z8742 Personal history of other diseases of the female genital tract: Secondary | ICD-10-CM | POA: Insufficient documentation

## 2013-04-20 DIAGNOSIS — Z91040 Latex allergy status: Secondary | ICD-10-CM | POA: Insufficient documentation

## 2013-04-20 DIAGNOSIS — N3090 Cystitis, unspecified without hematuria: Secondary | ICD-10-CM | POA: Insufficient documentation

## 2013-04-20 DIAGNOSIS — F411 Generalized anxiety disorder: Secondary | ICD-10-CM | POA: Insufficient documentation

## 2013-04-20 LAB — URINE MICROSCOPIC-ADD ON

## 2013-04-20 LAB — COMPREHENSIVE METABOLIC PANEL
ALT: 28 U/L (ref 0–35)
AST: 22 U/L (ref 0–37)
Alkaline Phosphatase: 71 U/L (ref 39–117)
BUN: 15 mg/dL (ref 6–23)
CO2: 26 mEq/L (ref 19–32)
Calcium: 10.2 mg/dL (ref 8.4–10.5)
Chloride: 98 mEq/L (ref 96–112)
GFR calc Af Amer: >90 mL/min (ref >90)
GFR calc non Af Amer: >90 mL/min (ref >90)
Glucose, Bld: 95 mg/dL (ref 70–99)
Sodium: 135 mEq/L (ref 135–145)
Total Bilirubin: 0.2 mg/dL — ABNORMAL LOW (ref 0.3–1.2)

## 2013-04-20 LAB — CBC WITH DIFFERENTIAL/PLATELET
Basophils Absolute: 0 10*3/uL (ref 0.0–0.1)
Eosinophils Relative: 1 % (ref 0–5)
HCT: 39.6 % (ref 36.0–46.0)
Hemoglobin: 13.3 g/dL (ref 12.0–15.0)
Lymphocytes Relative: 12 % (ref 12–46)
Lymphs Abs: 1.5 10*3/uL (ref 0.7–4.0)
MCH: 30.5 pg (ref 26.0–34.0)
MCV: 90.8 fL (ref 78.0–100.0)
Monocytes Relative: 8 % (ref 3–12)
Neutro Abs: 10.2 10*3/uL — ABNORMAL HIGH (ref 1.7–7.7)
Platelets: 314 10*3/uL (ref 150–400)
RBC: 4.36 MIL/uL (ref 3.87–5.11)
WBC: 12.9 10*3/uL — ABNORMAL HIGH (ref 4.0–10.5)

## 2013-04-20 LAB — URINALYSIS, ROUTINE W REFLEX MICROSCOPIC
Bilirubin Urine: NEGATIVE
Ketones, ur: NEGATIVE mg/dL
Nitrite: POSITIVE — AB
Protein, ur: >300 mg/dL — AB
Specific Gravity, Urine: 1.019 (ref 1.005–1.030)
Urobilinogen, UA: 0.2 mg/dL (ref 0.0–1.0)

## 2013-04-20 LAB — POCT PREGNANCY, URINE: Preg Test, Ur: NEGATIVE

## 2013-04-20 MED ORDER — HYDROCODONE-ACETAMINOPHEN 5-325 MG PO TABS
1.0000 | ORAL_TABLET | Freq: Once | ORAL | Status: AC
  Administered 2013-04-20: 1 via ORAL
  Filled 2013-04-20: qty 1

## 2013-04-20 MED ORDER — PHENAZOPYRIDINE HCL 200 MG PO TABS: 200.0000 mg | ORAL_TABLET | Freq: Three times a day (TID) | ORAL | Status: DC

## 2013-04-20 MED ORDER — ONDANSETRON 4 MG PO TBDP: 4.0000 mg | ORAL_TABLET | Freq: Three times a day (TID) | ORAL | Status: DC | PRN

## 2013-04-20 MED ORDER — HYDROCODONE-ACETAMINOPHEN 5-325 MG PO TABS: ORAL_TABLET | ORAL | Status: DC

## 2013-04-20 MED ORDER — NITROFURANTOIN MONOHYD MACRO 100 MG PO CAPS
100.0000 mg | ORAL_CAPSULE | Freq: Once | ORAL | Status: AC
  Administered 2013-04-20: 100 mg via ORAL
  Filled 2013-04-20: qty 1

## 2013-04-20 MED ORDER — ONDANSETRON 4 MG PO TBDP
4.0000 mg | ORAL_TABLET | Freq: Once | ORAL | Status: AC
  Administered 2013-04-20: 4 mg via ORAL
  Filled 2013-04-20: qty 1

## 2013-04-20 MED ORDER — NITROFURANTOIN MONOHYD MACRO 100 MG PO CAPS: 100.0000 mg | ORAL_CAPSULE | Freq: Two times a day (BID) | ORAL | Status: DC

## 2013-04-20 NOTE — ED Provider Notes (Signed)
CSN: [id number]     Arrival date & time 04/20/13  0347 History   First MD Initiated Contact with Patient 04/20/13 0600     Chief Complaint  Patient presents with  . Abdominal Pain   (Consider location/radiation/quality/duration/timing/severity/associated sxs/prior Treatment) HPI Comments: Patient presents with two-day history of dysuria, right lower abdominal pain, nausea. Patient describes the pain as constant however it is worse feels like "tearing" when she urinates. She noticed blood in urine this morning. She denies vaginal bleeding or discharge. She denies fever, vomiting, or back pain. History of exploratory laparotomy for left ovarian cyst, otherwise no surgeries. Patient has been taking over-the-counter medications without relief of pain. The onset of this condition was acute. The course is constant. Alleviating factors: none.  The history is provided by the patient.    Past Medical History  Diagnosis Date  . History of hip fracture 10/2010    still has pain, right hip  . Anxiety   . Eczema   . Hyperemesis arising during pregnancy   . Vaginitis and vulvovaginitis    Past Surgical History  Procedure Laterality Date  . Wisdom tooth extraction    . Laparoscopy Left 06/20/2012    Procedure: LAPAROSCOPY OPERATIVE;  Surgeon: Lavina Hamman, MD;  Location: [facility];  Service: Gynecology;  Laterality: Left;  OPEN LAPAROSCOPY, removal of portion of left ovarian cyst wall   Family History  Problem Relation Age of Onset  . Alcohol abuse Mother   . Anxiety disorder Mother   . Asthma Mother   . Mental illness Mother     bipolar  . Depression Mother   . Hypothyroidism Mother   . Ovarian cysts Mother   . Hypertension Father   . Endometriosis Sister   . Ovarian cysts Sister   . Alcohol abuse Brother   . Crohn's disease Brother   . Alcohol abuse Maternal Aunt   . Cancer Maternal Aunt     breast  . Hypothyroidism Maternal Aunt   . Miscarriages / Stillbirths Maternal Aunt    stillbirth  . Alcohol abuse Maternal Uncle   . Asthma Maternal Grandmother   . Mental illness Maternal Grandmother     bipolar  . Asthma Maternal Grandfather   . Diabetes Maternal Grandfather   . Hypertension Maternal Grandfather   . Asthma Paternal Grandmother   . Hypertension Paternal Grandmother   . Asthma Paternal Grandfather   . Diabetes Paternal Grandfather   . Heart disease Paternal Grandfather   . Hypertension Paternal Grandfather    History  Substance Use Topics  . Smoking status: Never Smoker   . Smokeless tobacco: Not on file  . Alcohol Use: No   OB History   Grav Para Term Preterm Abortions TAB SAB Ect Mult Living   2 2 2  0 0 0 0 0 0 2     Review of Systems  Constitutional: Negative for fever.  HENT: Negative for rhinorrhea and sore throat.   Eyes: Negative for redness.  Respiratory: Negative for cough.   Cardiovascular: Negative for chest pain.  Gastrointestinal: Positive for nausea and abdominal pain. Negative for vomiting and diarrhea.  Genitourinary: Positive for dysuria and hematuria. Negative for frequency, flank pain, vaginal bleeding and vaginal discharge.  Musculoskeletal: Negative for myalgias.  Skin: Negative for rash.  Neurological: Negative for headaches.    Allergies  Latex  Home Medications   Current Outpatient Rx  Name  Route  Sig  Dispense  Refill  . busPIRone (BUSPAR) 10 MG tablet   Oral  Take 10 mg by mouth 3 (three) times daily.         Marland Kitchen lurasidone (LATUDA) 80 MG TABS tablet   Oral   Take 80 mg by mouth daily with breakfast.         . Multiple Vitamin (MULTIVITAMIN WITH MINERALS) TABS tablet   Oral   Take 1 tablet by mouth daily.         . Vortioxetine HBr (BRINTELLIX) 10 MG TABS   Oral   Take 1 tablet by mouth daily.          BP 122/51  Pulse 102  Temp(Src) 98.8 F (37.1 C) (Oral)  Resp 18  SpO2 98%  LMP 04/12/2013 Physical Exam  Nursing note and vitals reviewed. Constitutional: She appears  well-developed and well-nourished.  HENT:  Head: Normocephalic and atraumatic.  Eyes: Conjunctivae are normal. Right eye exhibits no discharge. Left eye exhibits no discharge.  Neck: Normal range of motion. Neck supple.  Cardiovascular: Normal rate, regular rhythm and normal heart sounds.   Pulmonary/Chest: Effort normal and breath sounds normal.  Abdominal: Soft. There is tenderness (Pain across lower abd, worse over suprapubic area, less LLQ/RLQ (McBurney's)) in the suprapubic area. There is no rebound, no guarding and no CVA tenderness.  Neurological: She is alert.  Skin: Skin is warm and dry.  Psychiatric: She has a normal mood and affect.    ED Course  Procedures (including critical care time) Labs Review Labs Reviewed  CBC WITH DIFFERENTIAL - Abnormal; Notable for the following:    WBC 12.9 (*)    Neutrophils Relative % 79 (*)    Neutro Abs 10.2 (*)    All other components within normal limits  COMPREHENSIVE METABOLIC PANEL - Abnormal; Notable for the following:    Total Bilirubin 0.2 (*)    All other components within normal limits  URINALYSIS, ROUTINE W REFLEX MICROSCOPIC - Abnormal; Notable for the following:    APPearance CLOUDY (*)    Hgb urine dipstick LARGE (*)    Protein, ur >300 (*)    Nitrite POSITIVE (*)    Leukocytes, UA LARGE (*)    All other components within normal limits  URINE MICROSCOPIC-ADD ON - Abnormal; Notable for the following:    Bacteria, UA MANY (*)    All other components within normal limits  URINE CULTURE  POCT PREGNANCY, URINE   Imaging Review No results found.  EKG Interpretation   None      6:11 AM Patient seen and examined. Work-up initiated. Medications ordered. Informed of results. Abx, pain medicine, nausea medicine ordered. Pt requests discharge.   Vital signs reviewed and are as follows: Filed Vitals:   04/20/13 0355  BP: 122/51  Pulse: 102  Temp: 98.8 F (37.1 C)  Resp: 18   We discussed that an intra-abdominal  etiology such as appendicitis cannot be entirely ruled out. However, exam and labs strongly suggest uncomplicated UTI. Pt understands need to return with worsening.   The patient was urged to return to the Emergency Department immediately with worsening of current symptoms, worsening/changing abdominal pain, persistent vomiting, blood noted in stools, fever, or any other concerns. The patient verbalized understanding.   Patient counseled on use of narcotic pain medications. Counseled not to combine these medications with others containing tylenol. Urged not to drink alcohol, drive, or perform any other activities that requires focus while taking these medications. The patient verbalizes understanding and agrees with the plan.  6:53 AM Questions answers and instructions given. Patient continues  to do well. Will d/c.    MDM   1. Cystitis    Patient with labs and exam clinically suggestive of cystitis. No pyelo symptoms such as fever, vomiting, flank pain. Appendicitis cannot be completely ruled out but patient is not very tender in RLQ, does not have rebound or guarding and does not have any signs of peritonitis. Regardless she is counseled on return with progression or worsening of pain, fever. She appears well at time of discharge. Exam unchanged during ED stay.     Renne Crigler, PA-C 04/20/13 [phone]

## 2013-04-20 NOTE — ED Notes (Signed)
Unable to urinate in triage 

## 2013-04-20 NOTE — ED Notes (Signed)
Pt c/o RLQ abdominal pain that radiates to suprapubic and groin. Pt states it feels like a tearing sensation when she voids, also nausea. Denies vomiting.

## 2013-04-20 NOTE — ED Provider Notes (Signed)
  Medical screening examination/treatment/procedure(s) were performed by non-physician practitioner and as supervising physician I was immediately available for consultation/collaboration.  EKG Interpretation   None          Gerhard Munch, MD 04/20/13 [phone]

## 2013-04-20 NOTE — ED Notes (Signed)
Pt given d/c instructions and verbalized understanding. NAD at this time. VS are stable.  

## 2013-04-22 LAB — URINE CULTURE: Colony Count: >=100000

## 2014-03-05 ENCOUNTER — Encounter (HOSPITAL_COMMUNITY): Payer: Self-pay | Admitting: Emergency Medicine

## 2014-10-20 ENCOUNTER — Encounter (HOSPITAL_COMMUNITY): Payer: Self-pay | Admitting: Emergency Medicine

## 2014-10-20 ENCOUNTER — Emergency Department (HOSPITAL_COMMUNITY)
Admission: EM | Admit: 2014-10-20 | Discharge: 2014-10-21 | Disposition: A | Discharge status: Home / Self Care | Payer: BLUE CROSS/BLUE SHIELD | Attending: Emergency Medicine | Admitting: Emergency Medicine

## 2014-10-20 ENCOUNTER — Emergency Department (HOSPITAL_COMMUNITY): Payer: BLUE CROSS/BLUE SHIELD

## 2014-10-20 DIAGNOSIS — R05 Cough: Secondary | ICD-10-CM | POA: Insufficient documentation

## 2014-10-20 DIAGNOSIS — R000 Tachycardia, unspecified: Secondary | ICD-10-CM | POA: Diagnosis not present

## 2014-10-20 DIAGNOSIS — Z91040 Latex allergy status: Secondary | ICD-10-CM | POA: Diagnosis not present

## 2014-10-20 DIAGNOSIS — Z8742 Personal history of other diseases of the female genital tract: Secondary | ICD-10-CM | POA: Diagnosis not present

## 2014-10-20 DIAGNOSIS — R61 Generalized hyperhidrosis: Secondary | ICD-10-CM | POA: Insufficient documentation

## 2014-10-20 DIAGNOSIS — M79661 Pain in right lower leg: Secondary | ICD-10-CM | POA: Insufficient documentation

## 2014-10-20 DIAGNOSIS — Z872 Personal history of diseases of the skin and subcutaneous tissue: Secondary | ICD-10-CM | POA: Insufficient documentation

## 2014-10-20 DIAGNOSIS — R0602 Shortness of breath: Secondary | ICD-10-CM | POA: Insufficient documentation

## 2014-10-20 DIAGNOSIS — J36 Peritonsillar abscess: Secondary | ICD-10-CM | POA: Diagnosis not present

## 2014-10-20 DIAGNOSIS — Z79899 Other long term (current) drug therapy: Secondary | ICD-10-CM | POA: Diagnosis not present

## 2014-10-20 DIAGNOSIS — F419 Anxiety disorder, unspecified: Secondary | ICD-10-CM | POA: Insufficient documentation

## 2014-10-20 DIAGNOSIS — M79662 Pain in left lower leg: Secondary | ICD-10-CM | POA: Insufficient documentation

## 2014-10-20 DIAGNOSIS — Z8781 Personal history of (healed) traumatic fracture: Secondary | ICD-10-CM | POA: Diagnosis not present

## 2014-10-20 DIAGNOSIS — R002 Palpitations: Secondary | ICD-10-CM | POA: Insufficient documentation

## 2014-10-20 DIAGNOSIS — R079 Chest pain, unspecified: Secondary | ICD-10-CM | POA: Diagnosis not present

## 2014-10-20 LAB — BASIC METABOLIC PANEL
Anion gap: 10 (ref 5–15)
BUN: 10 mg/dL (ref 6–20)
CO2: 22 mmol/L (ref 22–32)
Calcium: 9.1 mg/dL (ref 8.9–10.3)
Chloride: 103 mmol/L (ref 101–111)
Creatinine, Ser: 0.63 mg/dL (ref 0.44–1.00)
GFR calc Af Amer: >60 mL/min (ref >60)
Glucose, Bld: 112 mg/dL — ABNORMAL HIGH (ref 65–99)
POTASSIUM: 3.6 mmol/L (ref 3.5–5.1)
SODIUM: 135 mmol/L (ref 135–145)

## 2014-10-20 LAB — I-STAT TROPONIN, ED: Troponin i, poc: 0 ng/mL (ref 0.00–0.08)

## 2014-10-20 LAB — CBC
HCT: 39.5 % (ref 36.0–46.0)
HEMOGLOBIN: 13.3 g/dL (ref 12.0–15.0)
MCH: 30.9 pg (ref 26.0–34.0)
MCHC: 33.7 g/dL (ref 30.0–36.0)
MCV: 91.6 fL (ref 78.0–100.0)
PLATELETS: 279 10*3/uL (ref 150–400)
RBC: 4.31 MIL/uL (ref 3.87–5.11)
RDW: 12.1 % (ref 11.5–15.5)
WBC: 12.9 10*3/uL — ABNORMAL HIGH (ref 4.0–10.5)

## 2014-10-20 MED ORDER — SODIUM CHLORIDE 0.9 % IV BOLUS (SEPSIS)
1000.0000 mL | Freq: Once | INTRAVENOUS | Status: AC
Start: 2014-10-20 — End: 2014-10-21
  Administered 2014-10-20: 1000 mL via INTRAVENOUS

## 2014-10-20 MED ORDER — LORAZEPAM 2 MG/ML IJ SOLN
1.0000 mg | Freq: Once | INTRAMUSCULAR | Status: AC
  Administered 2014-10-20: 1 mg via INTRAVENOUS
  Filled 2014-10-20: qty 1

## 2014-10-20 NOTE — ED Provider Notes (Signed)
CSN: [id number]     Arrival date & time 10/20/14  2305 History  This chart was scribed for Christina Rhine, MD by Freida Busman, ED Scribe. This patient was seen in room [facility] and the patient's care was started 11:17 PM.    Chief Complaint  Patient presents with  . Chest Pain    Patient is a 29 y.o. female presenting with chest pain. The history is provided by the patient. No language interpreter was used.  Chest Pain Pain location:  Substernal area Pain radiates to:  Does not radiate Pain radiates to the back: no   Pain severity:  Mild Timing:  Constant Progression:  Unchanged Chronicity:  New Context: at rest   Context: no drug use   Relieved by:  None tried Ineffective treatments:  None tried Associated symptoms: cough, diaphoresis, palpitations and shortness of breath   Associated symptoms: no abdominal pain, no fever and no syncope   Cough:    Cough characteristics:  Dry   Timing:  Sporadic Shortness of breath:    Severity:  Moderate   Timing:  Intermittent    HPI Comments:  Christina [name] is a 29 y.o. female with a PMHx of anxiety who presents to the Emergency Department complaining of intermittent episodes of SOB for ~3 weeks. She notes this evening she was watching TV  when her breathing became more erratic and she began to experience chest pressure and palpitations. At this time she rates her pain a 2/10.  She also reports associated diaphoresis and intermittent dry cough. She denies fever, vomiting, abdominal pain, back pain and BLE edema. She notes mild pain to her calves secondary to walking around [place] an amusement park ~6 days ago. Pt has an IUD in place which she has had for ~3 years. She denies recent long periods of immobilization, smoking and illicit drug use. No alleviating factors noted. Pt notes her last dose of xanax was about 1600 this afternoon.    Past Medical History  Diagnosis Date  . History of hip fracture 10/2010    still has pain, right hip   . Anxiety   . Eczema   . Hyperemesis arising during pregnancy   . Vaginitis and vulvovaginitis    Past Surgical History  Procedure Laterality Date  . Wisdom tooth extraction    . Laparoscopy Left 06/20/2012    Procedure: LAPAROSCOPY OPERATIVE;  Surgeon: Lavina Hamman, MD;  Location: [facility];  Service: Gynecology;  Laterality: Left;  OPEN LAPAROSCOPY, removal of portion of left ovarian cyst wall   Family History  Problem Relation Age of Onset  . Alcohol abuse Mother   . Anxiety disorder Mother   . Asthma Mother   . Mental illness Mother     bipolar  . Depression Mother   . Hypothyroidism Mother   . Ovarian cysts Mother   . Hypertension Father   . Endometriosis Sister   . Ovarian cysts Sister   . Alcohol abuse Brother   . Crohn's disease Brother   . Alcohol abuse Maternal Aunt   . Cancer Maternal Aunt     breast  . Hypothyroidism Maternal Aunt   . Miscarriages / Stillbirths Maternal Aunt     stillbirth  . Alcohol abuse Maternal Uncle   . Asthma Maternal Grandmother   . Mental illness Maternal Grandmother     bipolar  . Asthma Maternal Grandfather   . Diabetes Maternal Grandfather   . Hypertension Maternal Grandfather   . Asthma Paternal Grandmother   .  Hypertension Paternal Grandmother   . Asthma Paternal Grandfather   . Diabetes Paternal Grandfather   . Heart disease Paternal Grandfather   . Hypertension Paternal Grandfather    History  Substance Use Topics  . Smoking status: Never Smoker   . Smokeless tobacco: Not on file  . Alcohol Use: No   OB History    Gravida Para Term Preterm AB TAB SAB Ectopic Multiple Living   0 0 0 0 0 0 2     Review of Systems  Constitutional: Positive for diaphoresis. Negative for fever.  Respiratory: Positive for cough and shortness of breath.   Cardiovascular: Positive for chest pain and palpitations. Negative for syncope.  Gastrointestinal: Negative for abdominal pain.  Musculoskeletal: Positive for myalgias (Calf  pain).  All other systems reviewed and are negative.     Allergies  Latex  Home Medications   Prior to Admission medications   Medication Sig Start Date End Date Taking? Authorizing Provider  busPIRone (BUSPAR) 10 MG tablet Take 10 mg by mouth 3 (three) times daily.    Historical Provider, MD  HYDROcodone-acetaminophen (NORCO/VICODIN) 5-325 MG per tablet Take 1-2 tablets every 6 hours as needed for severe pain 04/20/13   Renne Crigler, PA-C  lurasidone (LATUDA) 80 MG TABS tablet Take 80 mg by mouth daily with breakfast.    Historical Provider, MD  Multiple Vitamin (MULTIVITAMIN WITH MINERALS) TABS tablet Take 1 tablet by mouth daily.    Historical Provider, MD  nitrofurantoin, macrocrystal-monohydrate, (MACROBID) 100 MG capsule Take 1 capsule (100 mg total) by mouth 2 (two) times daily. 04/20/13   Renne Crigler, PA-C  ondansetron (ZOFRAN ODT) 4 MG disintegrating tablet Take 1 tablet (4 mg total) by mouth every 8 (eight) hours as needed for nausea or vomiting. 04/20/13   Renne Crigler, PA-C  phenazopyridine (PYRIDIUM) 200 MG tablet Take 1 tablet (200 mg total) by mouth 3 (three) times daily. 04/20/13   Renne Crigler, PA-C  Vortioxetine HBr (BRINTELLIX) 10 MG TABS Take 1 tablet by mouth daily.    Historical Provider, MD   BP 136/83 mmHg  Pulse 123  Temp(Src) 99.5 F (37.5 C)  Resp 21  Ht  (1.702 m)  Wt 180 lb (81.647 kg)  BMI 28.19 kg/m2  SpO2 100% Physical Exam   CONSTITUTIONAL: Well developed/well nourished HEAD: Normocephalic/atraumatic EYES: EOMI/PERRL ENMT: Mucous membranes moist NECK: supple no meningeal signs SPINE/BACK:entire spine nontender CV: Tachycardic S1/S2 noted, no murmurs/rubs/gallops noted LUNGS: Lungs are clear to auscultation bilaterally, no apparent distress ABDOMEN: soft, nontender, no rebound or guarding, bowel sounds noted throughout abdomen GU:no cva tenderness NEURO: Pt is awake/alert/appropriate, moves all extremitiesx4.  No facial droop.    EXTREMITIES: pulses normal/equal, full ROM. N calf tenderness or edema  SKIN: warm, color normal PSYCH: mildly anxious   ED Course  Procedures   DIAGNOSTIC STUDIES:  Oxygen Saturation is 98% on RA, normal by my interpretation.    COORDINATION OF CARE:  11:25 PM Will order IV fluids, CXR and lab work Discussed treatment plan with pt at bedside and pt agreed to plan. 1:37 AM HR improving Workup negative thus far  Pt improved HR improved from 115-120 after IV fluids and ativan She ambulated without difficulty No dysrhythmia on EKG No hypoxia No evidence of PE I feel she is safe for d/c home Referred to cardiology Pt is agreeable with plan  Labs Review Labs Reviewed  CBC - Abnormal; Notable for the following:    WBC 12.9 (*)  All other components within normal limits  BASIC METABOLIC PANEL - Abnormal; Notable for the following:    Glucose, Bld 112 (*)    All other components within normal limits  BRAIN NATRIURETIC PEPTIDE  D-DIMER, QUANTITATIVE (NOT AT [facility])  Rosezena Sensor, ED    Imaging Review Dg Chest Port 1 View  10/20/2014   CLINICAL DATA:  29 year old female with severe shortness of Breath chest pain and palpitations. Initial encounter.  EXAM: PORTABLE CHEST - 1 VIEW  COMPARISON:  07/29/2007.  FINDINGS: Portable AP semi upright view at 2321 hrs. Normal lung volumes. Normal cardiac size and mediastinal contours. Visualized tracheal air column is within normal limits. Allowing for portable technique, the lungs are clear. No pneumothorax.  IMPRESSION: Negative, no acute cardiopulmonary abnormality.   Electronically Signed   By: Odessa Fleming M.D.   On: 10/20/2014 23:39     EKG Interpretation   Date/Time:  Sunday October 21 2014 01:02:30 EDT Ventricular Rate:  128 PR Interval:  126 QRS Duration: 84 QT Interval:  310 QTC Calculation: 452 R Axis:   67 Text Interpretation:  Sinus tachycardia Confirmed by Bebe Shaggy  MD, Ashwini Jago  [phone]) on 10/21/2014 1:09:04 AM      Medications  LORazepam (ATIVAN) injection 1 mg (1 mg Intravenous Given 10/20/14 2330)  sodium chloride 0.9 % bolus 1,000 mL (0 mLs Intravenous Stopped 10/21/14 0003)  sodium chloride 0.9 % bolus 1,000 mL (0 mLs Intravenous Stopped 10/21/14 0114)  LORazepam (ATIVAN) injection 1 mg (1 mg Intravenous Given 10/21/14 0111)  sodium chloride 0.9 % bolus 1,000 mL (0 mLs Intravenous Stopped 10/21/14 0323)  ibuprofen (ADVIL,MOTRIN) tablet 600 mg (600 mg Oral Given 10/21/14 0206)    MDM   Final diagnoses:  Chest pain, unspecified chest pain type  Palpitations    Nursing notes including past medical history and social history reviewed and considered in documentation xrays/imaging reviewed by myself and considered during evaluation Labs/vital reviewed myself and considered during evaluation    I personally performed the services described in this documentation, which was scribed in my presence. The recorded information has been reviewed and is accurate.      Christina Rhine, MD 10/21/14 [phone]

## 2014-10-20 NOTE — ED Notes (Signed)
Pt sts that for the past 3 weeks she has had sob and when she would take a deep breath it would make her cough. Tonight cp and sob became more severe and she felt like she couldn't catch her breath. Pt sts her heart is racing and feeling hot and flushed.

## 2014-10-21 ENCOUNTER — Emergency Department (HOSPITAL_COMMUNITY)
Admission: EM | Admit: 2014-10-21 | Discharge: 2014-10-21 | Disposition: A | Discharge status: Home / Self Care | Payer: BLUE CROSS/BLUE SHIELD | Attending: Emergency Medicine | Admitting: Emergency Medicine

## 2014-10-21 ENCOUNTER — Encounter (HOSPITAL_COMMUNITY): Payer: Self-pay | Admitting: *Deleted

## 2014-10-21 DIAGNOSIS — Z8781 Personal history of (healed) traumatic fracture: Secondary | ICD-10-CM | POA: Insufficient documentation

## 2014-10-21 DIAGNOSIS — R61 Generalized hyperhidrosis: Secondary | ICD-10-CM | POA: Insufficient documentation

## 2014-10-21 DIAGNOSIS — Z872 Personal history of diseases of the skin and subcutaneous tissue: Secondary | ICD-10-CM | POA: Diagnosis not present

## 2014-10-21 DIAGNOSIS — R079 Chest pain, unspecified: Secondary | ICD-10-CM | POA: Diagnosis present

## 2014-10-21 DIAGNOSIS — Z8742 Personal history of other diseases of the female genital tract: Secondary | ICD-10-CM | POA: Diagnosis not present

## 2014-10-21 DIAGNOSIS — Z91040 Latex allergy status: Secondary | ICD-10-CM | POA: Diagnosis not present

## 2014-10-21 DIAGNOSIS — Z3202 Encounter for pregnancy test, result negative: Secondary | ICD-10-CM | POA: Insufficient documentation

## 2014-10-21 DIAGNOSIS — F419 Anxiety disorder, unspecified: Secondary | ICD-10-CM | POA: Insufficient documentation

## 2014-10-21 DIAGNOSIS — R5383 Other fatigue: Secondary | ICD-10-CM | POA: Diagnosis not present

## 2014-10-21 DIAGNOSIS — Z79899 Other long term (current) drug therapy: Secondary | ICD-10-CM | POA: Diagnosis not present

## 2014-10-21 DIAGNOSIS — J36 Peritonsillar abscess: Secondary | ICD-10-CM | POA: Insufficient documentation

## 2014-10-21 LAB — URINALYSIS, ROUTINE W REFLEX MICROSCOPIC
Bilirubin Urine: NEGATIVE
Glucose, UA: NEGATIVE mg/dL
Hgb urine dipstick: NEGATIVE
Ketones, ur: NEGATIVE mg/dL
Leukocytes, UA: NEGATIVE
Nitrite: NEGATIVE
Protein, ur: NEGATIVE mg/dL
Specific Gravity, Urine: 1.008 (ref 1.005–1.030)
Urobilinogen, UA: 0.2 mg/dL (ref 0.0–1.0)
pH: 7 (ref 5.0–8.0)

## 2014-10-21 LAB — RAPID URINE DRUG SCREEN, HOSP PERFORMED
Amphetamines: POSITIVE — AB
Barbiturates: NOT DETECTED
Benzodiazepines: POSITIVE — AB
Cocaine: NOT DETECTED
Opiates: NOT DETECTED
Tetrahydrocannabinol: NOT DETECTED

## 2014-10-21 LAB — HCG, QUANTITATIVE, PREGNANCY: hCG, Beta Chain, Quant, S: <1 m[IU]/mL (ref <5)

## 2014-10-21 LAB — RAPID STREP SCREEN (MED CTR MEBANE ONLY): Streptococcus, Group A Screen (Direct): NEGATIVE

## 2014-10-21 LAB — BRAIN NATRIURETIC PEPTIDE: B Natriuretic Peptide: 14 pg/mL (ref 0.0–100.0)

## 2014-10-21 LAB — D-DIMER, QUANTITATIVE: D-Dimer, Quant: <0.27 ug/mL-FEU (ref 0.00–0.48)

## 2014-10-21 LAB — I-STAT CG4 LACTIC ACID, ED: Lactic Acid, Venous: 0.51 mmol/L (ref 0.5–2.0)

## 2014-10-21 MED ORDER — SODIUM CHLORIDE 0.9 % IV BOLUS (SEPSIS)
1000.0000 mL | Freq: Once | INTRAVENOUS | Status: AC
  Administered 2014-10-21: 1000 mL via INTRAVENOUS

## 2014-10-21 MED ORDER — LORAZEPAM 1 MG PO TABS
1.0000 mg | ORAL_TABLET | Freq: Once | ORAL | Status: AC
  Administered 2014-10-21: 1 mg via ORAL
  Filled 2014-10-21: qty 1

## 2014-10-21 MED ORDER — IBUPROFEN 200 MG PO TABS
600.0000 mg | ORAL_TABLET | Freq: Once | ORAL | Status: AC
  Administered 2014-10-21: 600 mg via ORAL
  Filled 2014-10-21 (×2): qty 1

## 2014-10-21 MED ORDER — CLINDAMYCIN PHOSPHATE 600 MG/50ML IV SOLN
600.0000 mg | Freq: Once | INTRAVENOUS | Status: AC
  Administered 2014-10-21: 600 mg via INTRAVENOUS
  Filled 2014-10-21: qty 50

## 2014-10-21 MED ORDER — ACETAMINOPHEN 325 MG PO TABS: 650.0000 mg | ORAL_TABLET | Freq: Once | ORAL | Status: DC

## 2014-10-21 MED ORDER — ACETAMINOPHEN 325 MG PO TABS
650.0000 mg | ORAL_TABLET | Freq: Once | ORAL | Status: AC
  Administered 2014-10-21: 650 mg via ORAL
  Filled 2014-10-21: qty 2

## 2014-10-21 MED ORDER — LORAZEPAM 2 MG/ML IJ SOLN
1.0000 mg | Freq: Once | INTRAMUSCULAR | Status: AC
  Administered 2014-10-21: 1 mg via INTRAVENOUS
  Filled 2014-10-21: qty 1

## 2014-10-21 MED ORDER — CLINDAMYCIN HCL 150 MG PO CAPS: 300.0000 mg | ORAL_CAPSULE | Freq: Three times a day (TID) | ORAL | Status: DC

## 2014-10-21 NOTE — Discharge Instructions (Signed)

## 2014-10-21 NOTE — Discharge Instructions (Signed)
Please use antibiotics as directed, follow-up with ENT specialist first thing tomorrow morning. Please return to emergency room immediately if new or worsening signs or symptoms present.

## 2014-10-21 NOTE — ED Provider Notes (Signed)
Patient presented to the ER with generalized body aches and fever. Patient was in the ED yesterday with complaints of tightness in her chest. Her cardiac workup was negative. Today she developed a fever to 102 at home and has had generalized body aches, cough, sore throat.  Face to face Exam: HEENT - PERRLA, right sided tonsillar enlargement with slight shift to the right concerning for small peritonsillar abscess, tonsillar exudate present Lungs - CTAB Heart - RRR, no M/R/G Abd - S/NT/ND Neuro - alert, oriented x3  Plan: Discussed with ENT. Patient likely has a small peritonsillar abscess, not amenable to drainage at this time. Will initiate IV antibiotic, follow up in office tomorrow for recheck. Patient is tachycardic which is likely secondary to fever, infection, pain. She does not appear to be septic, other vital signs have been stable.  Gilda Crease, MD 10/21/14 [phone]

## 2014-10-21 NOTE — ED Notes (Signed)
The pt has had body aches allergie symptoms chest heaviness with sl temp  And a racing pounding heart since yesterday    No distress.  lmp none iud

## 2014-10-21 NOTE — ED Notes (Signed)
This RN was alerted by a coworker that the pt was experiencing SOB. This RN went into the room to find the pt in no acute distress, but describing a worsening tightness in her chest.

## 2014-10-21 NOTE — ED Provider Notes (Signed)
CSN: [id number]     Arrival date & time 10/21/14  1512 History   First MD Initiated Contact with Patient 10/21/14 1549     Chief Complaint  Patient presents with  . Generalized Body Aches    HPI   29 year old female presents today with chief complaints of chest tightness, fatigue, body aches. Patient reports that she was seen in the ED last night for complaints of chest pain in her substernal area. Patient reports she's had intermittent shortness of breath for approximately 3 weeks, when yesterday she had acute episode of chest pressure and heart palpitations. She noted at that time she had diaphoresis and anxiety. Patient reports a significant past medical history of anxiety, denies history of cardiac or pulmonary problems, does not smoke, drink, use illicit drugs. No history of oral contraceptives use, prolonged immobilization, recent pregnancy, surgery, trauma, leg swelling, or any other concerning signs or symptoms for pulmonary embolism or cardiac dysfunction. Patient reports that she was feeling slightly better when she left the ED last night, but was instructed to return immediately if new or worsening signs or symptoms present. Patient reports that this morning she got up she was feeling fatigued checked her temperature with a fever of 102; has not tried antipyretics at home. She denies any change in her chest tightness or pain today, denies any vomiting, reports some nausea, denies abdominal pain, diarrhea. Patient notes a significant past medical history of allergies for which she takes Claritin daily, notes that she's been outside last 2 days exposed to grass, experiencing rhinorrhea, and a dry cough, and minor sore throat, no neck pain or stiffness. She denies any abnormal skin manifestations, changes in urine color clarity characteristics, or abnormal bowel movements. No one around here experiencing similar symptoms. Patient does note last month she had ear infections and required multiple  course of antibiotics, minor discomfort in her right ear today.     Past Medical History  Diagnosis Date  . History of hip fracture 10/2010    still has pain, right hip  . Anxiety   . Eczema   . Hyperemesis arising during pregnancy   . Vaginitis and vulvovaginitis    Past Surgical History  Procedure Laterality Date  . Wisdom tooth extraction    . Laparoscopy Left 06/20/2012    Procedure: LAPAROSCOPY OPERATIVE;  Surgeon: Lavina Hamman, MD;  Location: [facility];  Service: Gynecology;  Laterality: Left;  OPEN LAPAROSCOPY, removal of portion of left ovarian cyst wall   Family History  Problem Relation Age of Onset  . Alcohol abuse Mother   . Anxiety disorder Mother   . Asthma Mother   . Mental illness Mother     bipolar  . Depression Mother   . Hypothyroidism Mother   . Ovarian cysts Mother   . Hypertension Father   . Endometriosis Sister   . Ovarian cysts Sister   . Alcohol abuse Brother   . Crohn's disease Brother   . Alcohol abuse Maternal Aunt   . Cancer Maternal Aunt     breast  . Hypothyroidism Maternal Aunt   . Miscarriages / Stillbirths Maternal Aunt     stillbirth  . Alcohol abuse Maternal Uncle   . Asthma Maternal Grandmother   . Mental illness Maternal Grandmother     bipolar  . Asthma Maternal Grandfather   . Diabetes Maternal Grandfather   . Hypertension Maternal Grandfather   . Asthma Paternal Grandmother   . Hypertension Paternal Grandmother   . Asthma Paternal Grandfather   .  Diabetes Paternal Grandfather   . Heart disease Paternal Grandfather   . Hypertension Paternal Grandfather    History  Substance Use Topics  . Smoking status: Never Smoker   . Smokeless tobacco: Not on file  . Alcohol Use: No   OB History    Gravida Para Term Preterm AB TAB SAB Ectopic Multiple Living   0 0 0 0 0 0 2     Review of Systems  All other systems reviewed and are negative.   Allergies  Sulfa antibiotics and Latex  Home Medications   Prior to  Admission medications   Medication Sig Start Date End Date Taking? Authorizing Provider  ALPRAZolam (XANAX XR) 1 MG 24 hr tablet Take 1 mg by mouth daily at 12 noon. At noon   Yes Historical Provider, MD  ALPRAZolam Prudy Feeler) 0.5 MG tablet Take 0.5 mg by mouth daily. At 4 pm   Yes Historical Provider, MD  amphetamine-dextroamphetamine (ADDERALL) 20 MG tablet Take 20 mg by mouth 3 (three) times daily.   Yes Historical Provider, MD  diphenhydrAMINE (BENADRYL) 25 MG tablet Take 25 mg by mouth at bedtime as needed for allergies.   Yes Historical Provider, MD  loratadine (CLARITIN) 10 MG tablet Take 10 mg by mouth daily as needed for allergies.   Yes Historical Provider, MD  Multiple Vitamin (MULTIVITAMIN WITH MINERALS) TABS tablet Take 1 tablet by mouth daily.   Yes Historical Provider, MD  clindamycin (CLEOCIN) 150 MG capsule Take 2 capsules (300 mg total) by mouth 3 (three) times daily. 10/21/14   Roxanne Orner, PA-C   BP 107/62 mmHg  Pulse 115  Temp(Src) 100.6 F (38.1 C) (Oral)  Resp 23  SpO2 100% Physical Exam  Constitutional: She is oriented to person, place, and time. She appears well-developed and well-nourished.  HENT:  Head: Normocephalic and atraumatic.  Right Ear: External ear normal.  Left Ear: External ear normal.  Mouth/Throat: Oropharyngeal exudate and tonsillar abscesses present. No posterior oropharyngeal edema.  Right tympanic membrane opaque, signs of dried blood. Bilateral enlarged tonsils with asymmetry, greater on the right does not cross midline   Eyes: Pupils are equal, round, and reactive to light.  Neck: Normal range of motion. Neck supple. No JVD present. No tracheal deviation present. No thyromegaly present.  Anterior cervical node tenderness, tenderness to palpation of neck soft tissue inferior to right angle of the jaw. Full active ROM neck.   Cardiovascular: Normal rate, regular rhythm, normal heart sounds and intact distal pulses.  Exam reveals no gallop and no  friction rub.   No murmur heard. Pulmonary/Chest: Effort normal and breath sounds normal. No stridor. No respiratory distress. She has no wheezes. She has no rales. She exhibits no tenderness.  Musculoskeletal: Normal range of motion.  Lymphadenopathy:    She has no cervical adenopathy.  Neurological: She is alert and oriented to person, place, and time. Coordination normal.  Skin: Skin is warm and dry.  Psychiatric: She has a normal mood and affect. Her behavior is normal. Judgment and thought content normal.  Nursing note and vitals reviewed.   ED Course  Procedures (including critical care time) Labs Review Labs Reviewed  URINE RAPID DRUG SCREEN, HOSP PERFORMED - Abnormal; Notable for the following:    Benzodiazepines POSITIVE (*)    Amphetamines POSITIVE (*)    All other components within normal limits  RAPID STREP SCREEN (NOT AT [facility])  CULTURE, GROUP A STREP  HCG, QUANTITATIVE, PREGNANCY  URINALYSIS, ROUTINE W REFLEX MICROSCOPIC (  NOT AT [facility])  I-STAT CG4 LACTIC ACID, ED    Imaging Review Dg Chest Port 1 View  10/20/2014   CLINICAL DATA:  29 year old female with severe shortness of Breath chest pain and palpitations. Initial encounter.  EXAM: PORTABLE CHEST - 1 VIEW  COMPARISON:  07/29/2007.  FINDINGS: Portable AP semi upright view at 2321 hrs. Normal lung volumes. Normal cardiac size and mediastinal contours. Visualized tracheal air column is within normal limits. Allowing for portable technique, the lungs are clear. No pneumothorax.  IMPRESSION: Negative, no acute cardiopulmonary abnormality.   Electronically Signed   By: Odessa Fleming M.D.   On: 10/20/2014 23:39     EKG Interpretation   Date/Time:  Sunday October 21 2014 15:16:55 EDT Ventricular Rate:  137 PR Interval:  130 QRS Duration: 74 QT Interval:  294 QTC Calculation: 443 R Axis:   67 Text Interpretation:  Sinus tachycardia Nonspecific ST and T wave  abnormality Abnormal ECG Confirmed by POLLINA  MD, CHRISTOPHER  [phone]) on  10/21/2014 4:35:10 PM      MDM   Final diagnoses:  Peritonsillar abscess    Labs: Rapid strep, urine drug screen, urinalysis, hCG- up at strep negative, urinalysis, hCG, d-dimer, troponin, CBC significant for leukocytosis at 12  Imaging: None indicated  Consults: ENT Dr. Jearld Fenton  Therapeutics: Clindamycin 600 mg  Assessment: PTA right  Plan:  Patient presents with peritonsillar abscess on the right, tachycardia, fever. Patient was given Tylenol here in the ED which resolved the fever, she was given 2 L of normal saline which did not improve her tachycardia. Tachycardia likely results from infection. Patient does not appear toxic,  is not at high risk with Qsofa. Dr. Jearld Fenton instructed patient to follow-up first thing tomorrow morning for an office drainage, oral antibiotics. Patient was counseled. Tonsillar abscess, and at her request discharged home with strict return precautions, instructed to use Tylenol or ibuprofen as needed for the fever, oral antibiotics, and follow-up first thing tomorrow morning with ENT. Patient verbalized understanding and agreement for today's plan, assured her follow-up evaluation and had no further questions or concerns at the time of discharge.       Eyvonne Mechanic, PA-C 10/22/14 0016  Gilda Crease, MD 10/22/14 Perlie Mayo

## 2014-10-21 NOTE — ED Notes (Signed)
C/o fatigue and weakness also

## 2014-10-21 NOTE — ED Notes (Signed)
Pt ambulated to restroom with steady gait, NAD 

## 2014-10-23 LAB — CULTURE, GROUP A STREP

## 2014-11-20 ENCOUNTER — Encounter: Payer: BLUE CROSS/BLUE SHIELD | Admitting: Cardiovascular Disease

## 2014-11-28 ENCOUNTER — Encounter: Payer: BLUE CROSS/BLUE SHIELD | Admitting: Cardiology

## 2014-12-11 ENCOUNTER — Emergency Department (HOSPITAL_COMMUNITY)
Admission: EM | Admit: 2014-12-11 | Discharge: 2014-12-11 | Disposition: A | Discharge status: Home / Self Care | Payer: BLUE CROSS/BLUE SHIELD | Attending: Emergency Medicine | Admitting: Emergency Medicine

## 2014-12-11 ENCOUNTER — Encounter (HOSPITAL_COMMUNITY): Payer: Self-pay | Admitting: Emergency Medicine

## 2014-12-11 DIAGNOSIS — Z8619 Personal history of other infectious and parasitic diseases: Secondary | ICD-10-CM | POA: Diagnosis not present

## 2014-12-11 DIAGNOSIS — F419 Anxiety disorder, unspecified: Secondary | ICD-10-CM | POA: Diagnosis not present

## 2014-12-11 DIAGNOSIS — Z91040 Latex allergy status: Secondary | ICD-10-CM | POA: Diagnosis not present

## 2014-12-11 DIAGNOSIS — Z79899 Other long term (current) drug therapy: Secondary | ICD-10-CM | POA: Diagnosis not present

## 2014-12-11 DIAGNOSIS — Z792 Long term (current) use of antibiotics: Secondary | ICD-10-CM | POA: Diagnosis not present

## 2014-12-11 DIAGNOSIS — N390 Urinary tract infection, site not specified: Secondary | ICD-10-CM

## 2014-12-11 DIAGNOSIS — Z8781 Personal history of (healed) traumatic fracture: Secondary | ICD-10-CM | POA: Insufficient documentation

## 2014-12-11 DIAGNOSIS — Z3202 Encounter for pregnancy test, result negative: Secondary | ICD-10-CM | POA: Diagnosis not present

## 2014-12-11 DIAGNOSIS — Z872 Personal history of diseases of the skin and subcutaneous tissue: Secondary | ICD-10-CM | POA: Insufficient documentation

## 2014-12-11 DIAGNOSIS — R300 Dysuria: Secondary | ICD-10-CM | POA: Diagnosis present

## 2014-12-11 HISTORY — DX: Urinary tract infection, site not specified: N39.0

## 2014-12-11 LAB — URINE MICROSCOPIC-ADD ON

## 2014-12-11 LAB — URINALYSIS, ROUTINE W REFLEX MICROSCOPIC
BILIRUBIN URINE: NEGATIVE
GLUCOSE, UA: NEGATIVE mg/dL
Ketones, ur: NEGATIVE mg/dL
NITRITE: POSITIVE — AB
PH: 5.5 (ref 5.0–8.0)
Protein, ur: NEGATIVE mg/dL
Specific Gravity, Urine: 1.027 (ref 1.005–1.030)
Urobilinogen, UA: 0.2 mg/dL (ref 0.0–1.0)

## 2014-12-11 LAB — POC URINE PREG, ED: Preg Test, Ur: NEGATIVE

## 2014-12-11 MED ORDER — PHENAZOPYRIDINE HCL 200 MG PO TABS: 200.0000 mg | ORAL_TABLET | Freq: Three times a day (TID) | ORAL | Status: DC

## 2014-12-11 MED ORDER — PHENAZOPYRIDINE HCL 100 MG PO TABS
200.0000 mg | ORAL_TABLET | Freq: Once | ORAL | Status: AC
  Administered 2014-12-11: 200 mg via ORAL
  Filled 2014-12-11: qty 2

## 2014-12-11 MED ORDER — CEPHALEXIN 500 MG PO CAPS: 500.0000 mg | ORAL_CAPSULE | Freq: Four times a day (QID) | ORAL | Status: DC

## 2014-12-11 MED ORDER — CEPHALEXIN 250 MG PO CAPS
500.0000 mg | ORAL_CAPSULE | Freq: Once | ORAL | Status: AC
  Administered 2014-12-11: 500 mg via ORAL
  Filled 2014-12-11: qty 2

## 2014-12-11 NOTE — ED Notes (Signed)
Pt. reports dysuria with concentrated malodorous urine onset 2 days ago . Denies fever or chills.

## 2014-12-11 NOTE — Discharge Instructions (Signed)
We are starting you on antibiotics and medication for bladder spasm. We have sent the urine for culture. If we need to change your medication someone will call you.

## 2014-12-11 NOTE — ED Provider Notes (Signed)
CSN: [id number]     Arrival date & time 12/11/14  2204 History  This chart was scribed for non-physician practitioner, Kerrie Buffalo, NP working with Bethann Berkshire, MD by Gwenyth Ober, ED scribe. This patient was seen in room TR10C/TR10C and the patient's care was started at 11:08 PM   Chief Complaint  Patient presents with  . Urinary Tract Infection   Patient is a 29 y.o. female presenting with dysuria. The history is provided by the patient. No language interpreter was used.  Dysuria Pain quality:  Sharp Pain severity:  Moderate Onset quality:  Gradual Duration:  2 days Timing:  Constant Progression:  Unchanged Chronicity:  New Recent urinary tract infections: no   Relieved by:  None tried Worsened by:  Nothing tried Ineffective treatments:  None tried Urinary symptoms: discolored urine, foul-smelling urine and frequent urination   Urinary symptoms: no hematuria   Associated symptoms: no abdominal pain and no vaginal discharge     HPI Comments: Christina [name] is a 28 y.o. female who presents to the Emergency Department complaining of intermittent, moderate dysuria that started 2 days ago. She states urinary frequency and dark, malodorous urine as associated symptoms. Pt has a history of UTI and states that current symptoms are consistent with prior episodes. She has 1 sexual partner and has been with them for a long time. Pt does not suspect STI. She denies vaginal discharge, hematuria and abdominal pain as associated symptoms.  Past Medical History  Diagnosis Date  . History of hip fracture 10/2010    still has pain, right hip  . Anxiety   . Eczema   . Hyperemesis arising during pregnancy   . Vaginitis and vulvovaginitis   . UTI (lower urinary tract infection)    Past Surgical History  Procedure Laterality Date  . Wisdom tooth extraction    . Laparoscopy Left 06/20/2012    Procedure: LAPAROSCOPY OPERATIVE;  Surgeon: Lavina Hamman, MD;  Location: [facility];  Service:  Gynecology;  Laterality: Left;  OPEN LAPAROSCOPY, removal of portion of left ovarian cyst wall   Family History  Problem Relation Age of Onset  . Alcohol abuse Mother   . Anxiety disorder Mother   . Asthma Mother   . Mental illness Mother     bipolar  . Depression Mother   . Hypothyroidism Mother   . Ovarian cysts Mother   . Hypertension Father   . Endometriosis Sister   . Ovarian cysts Sister   . Alcohol abuse Brother   . Crohn's disease Brother   . Alcohol abuse Maternal Aunt   . Cancer Maternal Aunt     breast  . Hypothyroidism Maternal Aunt   . Miscarriages / Stillbirths Maternal Aunt     stillbirth  . Alcohol abuse Maternal Uncle   . Asthma Maternal Grandmother   . Mental illness Maternal Grandmother     bipolar  . Asthma Maternal Grandfather   . Diabetes Maternal Grandfather   . Hypertension Maternal Grandfather   . Asthma Paternal Grandmother   . Hypertension Paternal Grandmother   . Asthma Paternal Grandfather   . Diabetes Paternal Grandfather   . Heart disease Paternal Grandfather   . Hypertension Paternal Grandfather    Social History  Substance Use Topics  . Smoking status: Never Smoker   . Smokeless tobacco: None  . Alcohol Use: No   OB History    Gravida Para Term Preterm AB TAB SAB Ectopic Multiple Living   2 2 2  0 0 0 0  0 0 2     Review of Systems  Gastrointestinal: Negative for abdominal pain.  Genitourinary: Positive for dysuria. Negative for vaginal discharge.  All other systems reviewed and are negative.   Allergies  Sulfa antibiotics and Latex  Home Medications   Prior to Admission medications   Medication Sig Start Date End Date Taking? Authorizing Provider  ALPRAZolam (XANAX XR) 1 MG 24 hr tablet Take 1 mg by mouth daily at 12 noon. At noon    Historical Provider, MD  ALPRAZolam Prudy Feeler) 0.5 MG tablet Take 0.5 mg by mouth daily. At 4 pm    Historical Provider, MD  amphetamine-dextroamphetamine (ADDERALL) 20 MG tablet Take 20 mg by  mouth 3 (three) times daily.    Historical Provider, MD  cephALEXin (KEFLEX) 500 MG capsule Take 1 capsule (500 mg total) by mouth 4 (four) times daily. 12/11/14   Hope Orlene Och, NP  clindamycin (CLEOCIN) 150 MG capsule Take 2 capsules (300 mg total) by mouth 3 (three) times daily. 10/21/14   Eyvonne Mechanic, PA-C  diphenhydrAMINE (BENADRYL) 25 MG tablet Take 25 mg by mouth at bedtime as needed for allergies.    Historical Provider, MD  loratadine (CLARITIN) 10 MG tablet Take 10 mg by mouth daily as needed for allergies.    Historical Provider, MD  Multiple Vitamin (MULTIVITAMIN WITH MINERALS) TABS tablet Take 1 tablet by mouth daily.    Historical Provider, MD  phenazopyridine (PYRIDIUM) 200 MG tablet Take 1 tablet (200 mg total) by mouth 3 (three) times daily. 12/11/14   Hope Orlene Och, NP   BP 120/62 mmHg  Pulse 99  Temp(Src) 98.6 F (37 C) (Oral)  Resp 16  SpO2 98% Physical Exam  Constitutional: She appears well-developed and well-nourished. No distress.  HENT:  Head: Normocephalic and atraumatic.  Eyes: Conjunctivae and EOM are normal.  Neck: Neck supple. No tracheal deviation present.  Cardiovascular: Normal rate.   Pulmonary/Chest: Effort normal. No respiratory distress.  Abdominal: Soft. There is no tenderness. There is no CVA tenderness.  Genitourinary:  Patient declined pelvic exam stating she has no vaginal d/c, no hx of STI's and no new sex partners.   Skin: Skin is warm and dry.  Psychiatric: She has a normal mood and affect. Her behavior is normal.  Nursing note and vitals reviewed.   ED Course  Procedures   DIAGNOSTIC STUDIES: Oxygen Saturation is 98% on RA, normal by my interpretation.    COORDINATION OF CARE: 11:11 PM Discussed treatment plan with pt which includes UA. Pt declined pelvic exam due to no history and no suspicion for STI. Pt agreed to plan.  Labs Review Results for orders placed or performed during the hospital encounter of 12/11/14 (from the past 48  hour(s))  Urinalysis, Routine w reflex microscopic (not at [facility])     Status: Abnormal   Collection Time: 12/11/14 10:23 PM  Result Value Ref Range   Color, Urine YELLOW YELLOW   APPearance HAZY (A) CLEAR   Specific Gravity, Urine 1.027 1.005 - 1.030   pH 5.5 5.0 - 8.0   Glucose, UA NEGATIVE NEGATIVE mg/dL   Hgb urine dipstick LARGE (A) NEGATIVE   Bilirubin Urine NEGATIVE NEGATIVE   Ketones, ur NEGATIVE NEGATIVE mg/dL   Protein, ur NEGATIVE NEGATIVE mg/dL   Urobilinogen, UA 0.2 0.0 - 1.0 mg/dL   Nitrite POSITIVE (A) NEGATIVE   Leukocytes, UA MODERATE (A) NEGATIVE  Urine microscopic-add on     Status: Abnormal   Collection Time: 12/11/14 10:23 PM  Result Value Ref Range   Squamous Epithelial / LPF MANY (A) RARE   WBC, UA 11-20 <3 WBC/hpf   RBC / HPF 21-50 <3 RBC/hpf   Bacteria, UA MANY (A) RARE  POC Urine Pregnancy, ED (do NOT order at [facility])     Status: None   Collection Time: 12/11/14 10:30 PM  Result Value Ref Range   Preg Test, Ur NEGATIVE NEGATIVE    Comment:        THE SENSITIVITY OF THIS METHODOLOGY IS >24 mIU/mL     MDM  29 y.o. female with dysuria, frequency and malodorous urine x 2 days. Stable for d/c without fever or signs of pyelo. Will treat UTI with antibiotics and Pyridium. Discussed with the patient and all questioned fully answered. She will follow up with her PCP or return here if any problems arise.   Final diagnoses:  UTI (lower urinary tract infection)   I personally performed the services described in this documentation, which was scribed in my presence. The recorded information has been reviewed and is accurate.     [address] [place], [place] 12/12/14 2332  Bethann Berkshire, MD 12/12/14 [phone]

## 2014-12-11 NOTE — ED Notes (Signed)
Pt. Left with all belongings and refused wheelchair. Discharge instructions were reviewed and all questions were answered.  

## 2014-12-14 LAB — URINE CULTURE: Culture: >=100000

## 2014-12-16 ENCOUNTER — Telehealth (HOSPITAL_COMMUNITY): Payer: Self-pay

## 2014-12-16 NOTE — Telephone Encounter (Signed)
Post ED Visit - Positive Culture Follow-up  Culture report reviewed by antimicrobial stewardship pharmacist:  Wes Dulaney, Pharm.D., BCPS  Celedonio Miyamoto, Pharm.D., BCPS  Georgina Pillion, Pharm.D., BCPS  [place], [address].D., BCPS, AAHIVP  Estella Husk, Pharm.D., BCPS, AAHIVP  Elder Cyphers, [address].D., BCPS  Positive Urine culture, >/= 100,0000 colonies -> E Coli Treated with Cephalexin, organism sensitive to the same and no further patient follow-up is required at this time.  Arvid Right 12/16/2014, 5:03 AM

## 2014-12-18 ENCOUNTER — Ambulatory Visit: Payer: BLUE CROSS/BLUE SHIELD | Admitting: Cardiovascular Disease

## 2014-12-19 ENCOUNTER — Encounter (HOSPITAL_COMMUNITY): Payer: Self-pay | Admitting: Emergency Medicine

## 2014-12-19 ENCOUNTER — Emergency Department (HOSPITAL_COMMUNITY)
Admission: EM | Admit: 2014-12-19 | Discharge: 2014-12-20 | Disposition: A | Discharge status: Home / Self Care | Payer: BLUE CROSS/BLUE SHIELD | Attending: Emergency Medicine | Admitting: Emergency Medicine

## 2014-12-19 DIAGNOSIS — Y9389 Activity, other specified: Secondary | ICD-10-CM | POA: Diagnosis not present

## 2014-12-19 DIAGNOSIS — Z792 Long term (current) use of antibiotics: Secondary | ICD-10-CM | POA: Diagnosis not present

## 2014-12-19 DIAGNOSIS — Z91040 Latex allergy status: Secondary | ICD-10-CM | POA: Insufficient documentation

## 2014-12-19 DIAGNOSIS — IMO0002 Reserved for concepts with insufficient information to code with codable children: Secondary | ICD-10-CM

## 2014-12-19 DIAGNOSIS — Z87440 Personal history of urinary (tract) infections: Secondary | ICD-10-CM | POA: Diagnosis not present

## 2014-12-19 DIAGNOSIS — Z8781 Personal history of (healed) traumatic fracture: Secondary | ICD-10-CM | POA: Insufficient documentation

## 2014-12-19 DIAGNOSIS — Z872 Personal history of diseases of the skin and subcutaneous tissue: Secondary | ICD-10-CM | POA: Insufficient documentation

## 2014-12-19 DIAGNOSIS — Z8742 Personal history of other diseases of the female genital tract: Secondary | ICD-10-CM | POA: Insufficient documentation

## 2014-12-19 DIAGNOSIS — Y9289 Other specified places as the place of occurrence of the external cause: Secondary | ICD-10-CM | POA: Diagnosis not present

## 2014-12-19 DIAGNOSIS — Z79899 Other long term (current) drug therapy: Secondary | ICD-10-CM | POA: Diagnosis not present

## 2014-12-19 DIAGNOSIS — F419 Anxiety disorder, unspecified: Secondary | ICD-10-CM | POA: Insufficient documentation

## 2014-12-19 DIAGNOSIS — Y990 Civilian activity done for income or pay: Secondary | ICD-10-CM | POA: Diagnosis not present

## 2014-12-19 DIAGNOSIS — Z23 Encounter for immunization: Secondary | ICD-10-CM | POA: Insufficient documentation

## 2014-12-19 DIAGNOSIS — W460XXA Contact with hypodermic needle, initial encounter: Secondary | ICD-10-CM | POA: Diagnosis not present

## 2014-12-19 DIAGNOSIS — S61232A Puncture wound without foreign body of right middle finger without damage to nail, initial encounter: Secondary | ICD-10-CM | POA: Insufficient documentation

## 2014-12-19 NOTE — ED Provider Notes (Signed)
History  This chart was scribed for non-physician practitioner, Mayme Genta, PA-C,working with Rochele Raring, MD, by Karle Plumber, ED Scribe. This patient was seen in room TR09C/TR09C and the patient's care was started at 11:24 PM.  Chief Complaint  Patient presents with  . Body Fluid Exposure   The history is provided by the patient and medical records. No language interpreter was used.    HPI Comments:  Christina [name] is a 29 y.o. female who presents to the Emergency Department complaining of a needle stick from an insulin pen needle that was left on a table at her job about three hours ago. She states she was cleaning a table off at her place of work when her third right digit was punctured by the needle. 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 She tried to get information from the customer such as name and PMHx but was unsuccessful. She reports mild associated bleeding from the site which has since subsided. She denies modifying factors. She denies redness or warmth of the area, nausea, vomiting, fever or chills.  Past Medical History  Diagnosis Date  . History of hip fracture 10/2010    still has pain, right hip  . Anxiety   . Eczema   . Hyperemesis arising during pregnancy   . Vaginitis and vulvovaginitis   . UTI (lower urinary tract infection)    Past Surgical History  Procedure Laterality Date  . Wisdom tooth extraction    . Laparoscopy Left 06/20/2012    Procedure: LAPAROSCOPY OPERATIVE;  Surgeon: Lavina Hamman, MD;  Location: [facility];  Service: Gynecology;  Laterality: Left;  OPEN LAPAROSCOPY, removal of portion of left ovarian cyst wall   Family History  Problem Relation Age of Onset  . Alcohol abuse Mother   . Anxiety disorder Mother   . Asthma Mother   . Mental illness Mother     bipolar  . Depression Mother   . Hypothyroidism Mother   . Ovarian cysts Mother   .  Hypertension Father   . Endometriosis Sister   . Ovarian cysts Sister   . Alcohol abuse Brother   . Crohn's disease Brother   . Alcohol abuse Maternal Aunt   . Cancer Maternal Aunt     breast  . Hypothyroidism Maternal Aunt   . Miscarriages / Stillbirths Maternal Aunt     stillbirth  . Alcohol abuse Maternal Uncle   . Asthma Maternal Grandmother   . Mental illness Maternal Grandmother     bipolar  . Asthma Maternal Grandfather   . Diabetes Maternal Grandfather   . Hypertension Maternal Grandfather   . Asthma Paternal Grandmother   . Hypertension Paternal Grandmother   . Asthma Paternal Grandfather   . Diabetes Paternal Grandfather   . Heart disease Paternal Grandfather   . Hypertension Paternal Grandfather    Social History  Substance Use Topics  . Smoking status: Never Smoker   . Smokeless tobacco: None  . Alcohol Use: No   OB History    Gravida Para Term Preterm AB TAB SAB Ectopic Multiple Living   2 2 2  0 0 0 0 0 0 2     Review of Systems  Constitutional: Negative for fever and chills.  Gastrointestinal: Negative for nausea and vomiting.  Skin: Positive for wound. Negative for color change.  All other systems reviewed and are negative.   Allergies  Sulfa  antibiotics and Latex  Home Medications   Prior to Admission medications   Medication Sig Start Date End Date Taking? Authorizing Provider  ALPRAZolam (XANAX XR) 1 MG 24 hr tablet Take 1 mg by mouth daily at 12 noon. At noon    Historical Provider, MD  ALPRAZolam Prudy Feeler) 0.5 MG tablet Take 0.5 mg by mouth daily. At 4 pm    Historical Provider, MD  amphetamine-dextroamphetamine (ADDERALL) 20 MG tablet Take 20 mg by mouth 3 (three) times daily.    Historical Provider, MD  cephALEXin (KEFLEX) 500 MG capsule Take 1 capsule (500 mg total) by mouth 4 (four) times daily. 12/11/14   Hope Orlene Och, NP  clindamycin (CLEOCIN) 150 MG capsule Take 2 capsules (300 mg total) by mouth 3 (three) times daily. 10/21/14   Eyvonne Mechanic, PA-C  diphenhydrAMINE (BENADRYL) 25 MG tablet Take 25 mg by mouth at bedtime as needed for allergies.    Historical Provider, MD  dolutegravir (TIVICAY) 50 MG tablet Take 1 tablet (50 mg total) by mouth daily. 12/20/14   Joycie Peek, PA-C  emtricitabine-tenofovir (TRUVADA) 200-300 MG per tablet Take 1 tablet by mouth daily. 12/20/14   Joycie Peek, PA-C  loratadine (CLARITIN) 10 MG tablet Take 10 mg by mouth daily as needed for allergies.    Historical Provider, MD  Multiple Vitamin (MULTIVITAMIN WITH MINERALS) TABS tablet Take 1 tablet by mouth daily.    Historical Provider, MD  ondansetron (ZOFRAN) 4 MG tablet Take 1 tablet (4 mg total) by mouth every 6 (six) hours. 12/20/14   Joycie Peek, PA-C  phenazopyridine (PYRIDIUM) 200 MG tablet Take 1 tablet (200 mg total) by mouth 3 (three) times daily. 12/11/14   Hope Orlene Och, NP   Triage Vitals: BP 134/96 mmHg  Pulse 115  Temp(Src) 98.9 F (37.2 C) (Oral)  Resp 20  SpO2 100% Physical Exam  Constitutional: She is oriented to person, place, and time. She appears well-developed and well-nourished.  HENT:  Head: Normocephalic and atraumatic.  Mouth/Throat: Uvula is midline, oropharynx is clear and moist and mucous membranes are normal.  Eyes: EOM are normal.  Neck: Normal range of motion.  Cardiovascular: Normal rate, regular rhythm and normal heart sounds.   No murmur heard. Pulmonary/Chest: Effort normal and breath sounds normal. No respiratory distress.  Musculoskeletal: Normal range of motion.  Neurological: She is alert and oriented to person, place, and time.  Skin: Skin is warm and dry.  Small puncture wound right third digit.  Psychiatric: She has a normal mood and affect. Her behavior is normal.  Nursing note and vitals reviewed.   ED Course  Procedures (including critical care time) DIAGNOSTIC STUDIES: Oxygen Saturation is 100% on RA, normal by my interpretation.   COORDINATION OF CARE: 11:40 PM- Will order  HIV/Hepatitis panel and start patient on ARV medications. Pt verbalizes understanding and agrees to plan.  Medications  Tdap (BOOSTRIX) injection 0.5 mL (0.5 mLs Intramuscular Given 12/20/14 0050)    Labs Review Labs Reviewed  RAPID HIV SCREEN (HIV 1/2 AB+AG)  HEPATITIS B SURFACE ANTIGEN  HEPATITIS C ANTIBODY (REFLEX)  HIV ANTIBODY (ROUTINE TESTING)    Imaging Review No results found. I have personally reviewed and evaluated these images and lab results as part of my medical decision-making.   EKG Interpretation None     Meds given in ED:  Medications  Tdap (BOOSTRIX) injection 0.5 mL (0.5 mLs Intramuscular Given 12/20/14 0050)    Discharge Medication List as of 12/20/2014 12:41 AM  START taking these medications   Details  dolutegravir (TIVICAY) 50 MG tablet Take 1 tablet (50 mg total) by mouth daily., Starting 12/20/2014, Until Discontinued, Print    emtricitabine-tenofovir (TRUVADA) 200-300 MG per tablet Take 1 tablet by mouth daily., Starting 12/20/2014, Until Discontinued, Print    ondansetron (ZOFRAN) 4 MG tablet Take 1 tablet (4 mg total) by mouth every 6 (six) hours., Starting 12/20/2014, Until Discontinued, Print       Filed Vitals:   12/19/14 2157 12/20/14 0059  BP: 134/96 133/80  Pulse: 115 106  Temp: 98.9 F (37.2 C) 98.2 F (36.8 C)  TempSrc: Oral Oral  Resp: 20 18  SpO2: 100% 100%    MDM  Vitals stable - no tachycardia on my exam, heart rate mid 80s. -afebrile Pt resting comfortably in ED. Labwork--HIV antibody and hepatitis panel ordered. Tetanus updated in ED. Due to inability to test source and questionable HIV status, will initiate ARV therapy. Discussed the patient she will need to follow-up with her primary care for repeat testing. She will need to establish follow-up within 1 week. Patient verbalizes understanding. Care management consult to facilitate further outpatient care  I discussed all relevant lab findings and imaging results with  pt and they verbalized understanding. Discussed f/u with PCP within 48 hrs and return precautions, pt very amenable to plan. Prior to patient discharge, I discussed and reviewed this case with Dr. Elesa Massed   Final diagnoses:  Needle stick injury      I personally performed the services described in this documentation, which was scribed in my presence. The recorded information has been reviewed and is accurate.    Joycie Peek, PA-C 12/20/14 0141  Layla Maw Ward, DO 12/20/14 1610

## 2014-12-19 NOTE — ED Notes (Signed)
Pt states she got stuck with a needle at work.  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  States she let it bleed for a few seconds and then washed it off.

## 2014-12-19 NOTE — ED Notes (Signed)
Pt stuck with insulin pen needle, brought with her

## 2014-12-20 LAB — RAPID HIV SCREEN (HIV 1/2 AB+AG)
HIV 1/2 Antibodies: NONREACTIVE
HIV-1 P24 Antigen - HIV24: NONREACTIVE

## 2014-12-20 LAB — HIV ANTIBODY (ROUTINE TESTING W REFLEX): HIV Screen 4th Generation wRfx: NONREACTIVE

## 2014-12-20 MED ORDER — DOLUTEGRAVIR SODIUM 50 MG PO TABS: 50.0000 mg | ORAL_TABLET | Freq: Every day | ORAL | Status: DC

## 2014-12-20 MED ORDER — TETANUS-DIPHTH-ACELL PERTUSSIS 5-2.5-18.5 LF-MCG/0.5 IM SUSP
0.5000 mL | Freq: Once | INTRAMUSCULAR | Status: AC
  Administered 2014-12-20: 0.5 mL via INTRAMUSCULAR
  Filled 2014-12-20: qty 0.5

## 2014-12-20 MED ORDER — ONDANSETRON HCL 4 MG PO TABS: 4.0000 mg | ORAL_TABLET | Freq: Four times a day (QID) | ORAL | Status: DC

## 2014-12-20 MED ORDER — EMTRICITABINE-TENOFOVIR DF 200-300 MG PO TABS: 1.0000 | ORAL_TABLET | Freq: Every day | ORAL | Status: DC

## 2014-12-20 NOTE — Discharge Instructions (Signed)
Is important for you to follow-up with your primary care for further evaluation and management of your symptoms. Please take your medications as prescribed. You will need to follow-up with your primary care within 1 week so they may establish blood draw times. Your tetanus shot was updated in the ED today.  Needle Stick Injury A needle stick injury occurs when you are stuck by a needle that may have the blood from another person on it. Most of the time these injuries heal without any problem, but several diseases can be transmitted this way. You should be aware of the risks. A needle stick injury can cause risk for getting:  Hepatitis B.  Hepatitis C.  HIV infection (the virus that causes AIDS). The chance of getting one of these infections from a needle stick injury is small. However, it is important to take proper precautions to prevent such an injury. It is also important to understand and follow some health care recommendations when such an injury occurs.  RISK FACTORS In general, the risk of infection after a needle stick injury appears to be higher with:  Exposure to a needle that is visibly contaminated with blood.  Exposure to the blood of a patient with an advanced disease or with a high viral load.  A deep injury.  Needle placement in a vein or artery. PREVENTION  All health care workers should:  Wash their hands often, including before and after caring for each patient.  Receive the hepatitis B vaccine before any possible exposure to blood or bloody body fluids.  Use personal protective equipment (PPE) when appropriate. This includes:  Gloves.  Gowns.  Boots.  Shoe covers.  Eyewear.  Masks.  Wear gloves when any kind of venous or arterial access is being done.  Use safety devices when available.  Use sharp edges and needles with caution.  Dispose of used needles and other sharps in puncture proof receptacles.  Never recap needles. TREATMENT   After a  needle stick injury, immediate cleansing with soap and water or an alcohol-based hand hygiene agent is needed.  If you did not have a tetanus booster within the past 10 years, a booster shot should be given.  If the puncture site becomes red, swollen, more painful, or drains yellowish-white fluid (pus), medicine for a bacterial infection may be needed.  If the blood on the needle is known or thought to be high risk for hepatitis B or HIV, additional treatment is needed.  For needle stick exposures to the HIV virus, drug treatment is advised. This treatment is called post-exposure prophylaxis (PEP) and should be started as soon as possible following the injury. The recommended period of treatment with medicines is usually 4 weeks with 2 or more different drugs. You should have follow-up counseling and a medical evaluation, including HIV blood tests, right away. The tests should be repeated at 6 weeks, 3 months, and 6 months. Blood tests to monitor for drug toxicity effects of the PEP medicines are usually recommended immediately before treatment starts and again at 2 weeks and 4 weeks after the start of PEP. Additional recommendations during the first 6 to 12 weeks after exposure include:  Practicing sexual abstinence or using condoms to prevent sexual transmission and to avoid pregnancy.  Refraining from donating blood, plasma, semen, organs, or other tissue.  For breastfeeding women, considering temporary discontinuation of breastfeeding while on PEP.  For needle stick exposures to hepatitis B, blood testing and PEP is also needed. If you have  not been vaccinated against hepatitis B, this vaccine series should be started and hepatitis B immune globulin should also be given. If you have been previously vaccinated, your status of immunity to infection with hepatitis B can be tested by a blood antibody test. Before or after those test results are available, repeat vaccination with or without hepatitis  B immune globulin will be considered.  Unfortunately, no helpful treatment following hepatitis C exposure has been identified or is recommended. Follow-up blood testing is advised over a period of 4 weeks to 6 months to determine if the needle stick led to an infection. Ask your caregiver for advice about this follow-up testing. HOME CARE INSTRUCTIONS  Take medicine exactly as told by your caregiver.  Keep all follow-up appointments.  Do not share personal hygiene items. SEEK IMMEDIATE MEDICAL CARE IF:  You have concerns about your injury, treatment, or follow-up.  The injury site becomes red, swollen, or painful.  The injury site drains pus. MAKE SURE YOU:  Understand these instructions.  Will watch your condition.  Will get help right away if you are not doing well or get worse. Document Released: 04/20/2005 Document Revised: 09/04/2013 Document Reviewed: 09/23/2010 [facility] Patient Information 2015 [place], [place]. This information is not intended to replace advice given to you by your health care provider. Make sure you discuss any questions you have with your health care provider.

## 2014-12-21 LAB — HCV COMMENT:

## 2014-12-21 LAB — HEPATITIS C ANTIBODY (REFLEX)

## 2014-12-21 LAB — HEPATITIS B SURFACE ANTIGEN: Hepatitis B Surface Ag: NEGATIVE

## 2015-01-30 ENCOUNTER — Ambulatory Visit: Payer: BLUE CROSS/BLUE SHIELD | Admitting: Cardiovascular Disease

## 2015-02-02 ENCOUNTER — Emergency Department (HOSPITAL_COMMUNITY): Payer: BLUE CROSS/BLUE SHIELD

## 2015-02-02 ENCOUNTER — Emergency Department (HOSPITAL_COMMUNITY)
Admission: EM | Admit: 2015-02-02 | Discharge: 2015-02-02 | Disposition: A | Discharge status: Home / Self Care | Payer: BLUE CROSS/BLUE SHIELD | Attending: Emergency Medicine | Admitting: Emergency Medicine

## 2015-02-02 ENCOUNTER — Encounter (HOSPITAL_COMMUNITY): Payer: Self-pay | Admitting: Emergency Medicine

## 2015-02-02 DIAGNOSIS — F419 Anxiety disorder, unspecified: Secondary | ICD-10-CM | POA: Insufficient documentation

## 2015-02-02 DIAGNOSIS — Z91040 Latex allergy status: Secondary | ICD-10-CM | POA: Diagnosis not present

## 2015-02-02 DIAGNOSIS — Z8742 Personal history of other diseases of the female genital tract: Secondary | ICD-10-CM | POA: Insufficient documentation

## 2015-02-02 DIAGNOSIS — Z872 Personal history of diseases of the skin and subcutaneous tissue: Secondary | ICD-10-CM | POA: Diagnosis not present

## 2015-02-02 DIAGNOSIS — R200 Anesthesia of skin: Secondary | ICD-10-CM | POA: Diagnosis not present

## 2015-02-02 DIAGNOSIS — Z79899 Other long term (current) drug therapy: Secondary | ICD-10-CM | POA: Diagnosis not present

## 2015-02-02 DIAGNOSIS — Z792 Long term (current) use of antibiotics: Secondary | ICD-10-CM | POA: Diagnosis not present

## 2015-02-02 DIAGNOSIS — Z8781 Personal history of (healed) traumatic fracture: Secondary | ICD-10-CM | POA: Insufficient documentation

## 2015-02-02 DIAGNOSIS — Z87440 Personal history of urinary (tract) infections: Secondary | ICD-10-CM | POA: Insufficient documentation

## 2015-02-02 DIAGNOSIS — R42 Dizziness and giddiness: Secondary | ICD-10-CM | POA: Diagnosis present

## 2015-02-02 LAB — COMPREHENSIVE METABOLIC PANEL
ALBUMIN: 4.4 g/dL (ref 3.5–5.0)
ALT: 21 U/L (ref 14–54)
ANION GAP: 9 (ref 5–15)
AST: 20 U/L (ref 15–41)
Alkaline Phosphatase: 66 U/L (ref 38–126)
BUN: 12 mg/dL (ref 6–20)
CALCIUM: 9.4 mg/dL (ref 8.9–10.3)
CO2: 26 mmol/L (ref 22–32)
Chloride: 102 mmol/L (ref 101–111)
Creatinine, Ser: 0.67 mg/dL (ref 0.44–1.00)
GFR calc Af Amer: >60 mL/min (ref >60)
GFR calc non Af Amer: >60 mL/min (ref >60)
GLUCOSE: 100 mg/dL — AB (ref 65–99)
Potassium: 3.9 mmol/L (ref 3.5–5.1)
SODIUM: 137 mmol/L (ref 135–145)
Total Bilirubin: 0.3 mg/dL (ref 0.3–1.2)
Total Protein: 7.3 g/dL (ref 6.5–8.1)

## 2015-02-02 LAB — I-STAT BETA HCG BLOOD, ED (MC, WL, AP ONLY): I-stat hCG, quantitative: <5 m[IU]/mL (ref <5)

## 2015-02-02 LAB — CBC
HCT: 41.3 % (ref 36.0–46.0)
Hemoglobin: 13.5 g/dL (ref 12.0–15.0)
MCH: 30.2 pg (ref 26.0–34.0)
MCHC: 32.7 g/dL (ref 30.0–36.0)
MCV: 92.4 fL (ref 78.0–100.0)
Platelets: 300 10*3/uL (ref 150–400)
RBC: 4.47 MIL/uL (ref 3.87–5.11)
RDW: 13.1 % (ref 11.5–15.5)
WBC: 5.9 10*3/uL (ref 4.0–10.5)

## 2015-02-02 MED ORDER — SODIUM CHLORIDE 0.9 % IV BOLUS (SEPSIS)
1000.0000 mL | Freq: Once | INTRAVENOUS | Status: AC
  Administered 2015-02-02: 1000 mL via INTRAVENOUS

## 2015-02-02 NOTE — ED Provider Notes (Signed)
Please see previous physician's note regarding patient's presenting history and physical, initial ED course, and associated MDM. In short, this is a 29 year oldf emale who presents with facial numbness in setting of what was described as likely panic attack. Despite resolved anxiety, had persistent perioral numbness. Pending MRI at time of sign out. The visualized and reviewed by radiology. No evidence of acute intracranial processes to explain her symptoms today. On reevaluation, she is well-appearing. Symptoms have been improving here in the emergency department and she reports mild perioral paresthesias. She will follow up closely with her primary care doctor regarding any further workup. Strict return and follow-up instructions are reviewed. She expressed understanding of all discharge instructions and felt comfortable to plan of care.  Lavera Guise, MD 02/02/15 [phone]

## 2015-02-02 NOTE — ED Notes (Signed)
Pt transporting to MRI.  

## 2015-02-02 NOTE — ED Notes (Signed)
Per EMS, pt reports that she was at work when she started to feel dizzy and reports that she had several episodes of blacking out. Pt reports that she feels like when she reads things it looks like they are going up and down. Pt alert x4.

## 2015-02-02 NOTE — Discharge Instructions (Signed)

## 2015-02-02 NOTE — ED Provider Notes (Signed)
CSN: [id number]     Arrival date & time 02/02/15  1353 History   First MD Initiated Contact with Patient 02/02/15 1357     Chief Complaint  Patient presents with  . Dizziness     (Consider location/radiation/quality/duration/timing/severity/associated sxs/prior Treatment) HPI Comments: The patient is a 29 year old female, she has a history of anxiety, history of frequent allergic reactions, history of urinary tract infection. She presents to the hospital by paramedic transport from her job site where she works at the [employer]. She reports that in the middle of her shift she started to develop some very strange symptoms including numbness of her chin, strange feeling in the back of her head, visual changes feeling like her vision is moving up and down, ringing in her ears, balance is off feeling like she is tilted to the side. She was unable to carry a food tray, felt as though she was passing out for short periods of time intermittently. She denies any other stressors, no recent changes in medications, has been able to eat today without difficulty including chocolate chip cookies for breakfast as well as eating lunch.  She has no chest pain or difficulty breathing, she felt like her heart was racing and states that she has been diagnosed with a tachycardia of unknown origin. This has been improving and she states her baseline heart rate is 120. Paramedics report a heart rate of 110 she denies diarrhea, dysuria, pregnancy, cough or shortness of breath  Patient is a 29 y.o. female presenting with dizziness. The history is provided by the patient and the EMS personnel.  Dizziness   Past Medical History  Diagnosis Date  . History of hip fracture 10/2010    still has pain, right hip  . Anxiety   . Eczema   . Hyperemesis arising during pregnancy   . Vaginitis and vulvovaginitis   . UTI (lower urinary tract infection)    Past Surgical History  Procedure Laterality Date  . Wisdom tooth  extraction    . Laparoscopy Left 06/20/2012    Procedure: LAPAROSCOPY OPERATIVE;  Surgeon: Lavina Hamman, MD;  Location: [facility];  Service: Gynecology;  Laterality: Left;  OPEN LAPAROSCOPY, removal of portion of left ovarian cyst wall   Family History  Problem Relation Age of Onset  . Alcohol abuse Mother   . Anxiety disorder Mother   . Asthma Mother   . Mental illness Mother     bipolar  . Depression Mother   . Hypothyroidism Mother   . Ovarian cysts Mother   . Hypertension Father   . Endometriosis Sister   . Ovarian cysts Sister   . Alcohol abuse Brother   . Crohn's disease Brother   . Alcohol abuse Maternal Aunt   . Cancer Maternal Aunt     breast  . Hypothyroidism Maternal Aunt   . Miscarriages / Stillbirths Maternal Aunt     stillbirth  . Alcohol abuse Maternal Uncle   . Asthma Maternal Grandmother   . Mental illness Maternal Grandmother     bipolar  . Asthma Maternal Grandfather   . Diabetes Maternal Grandfather   . Hypertension Maternal Grandfather   . Asthma Paternal Grandmother   . Hypertension Paternal Grandmother   . Asthma Paternal Grandfather   . Diabetes Paternal Grandfather   . Heart disease Paternal Grandfather   . Hypertension Paternal Grandfather    Social History  Substance Use Topics  . Smoking status: Never Smoker   . Smokeless tobacco: None  . Alcohol  Use: No   OB History    Gravida Para Term Preterm AB TAB SAB Ectopic Multiple Living   0 0 0 0 0 0 2     Review of Systems  Neurological: Positive for dizziness.  All other systems reviewed and are negative.     Allergies  Sulfa antibiotics and Latex  Home Medications   Prior to Admission medications   Medication Sig Start Date End Date Taking? Authorizing Provider  adapalene (DIFFERIN) 0.1 % cream Apply 1 application topically 2 (two) times daily.   Yes Historical Provider, MD  ALPRAZolam (XANAX XR) 1 MG 24 hr tablet Take 1 mg by mouth daily at 12 noon. At noon   Yes Historical  Provider, MD  ALPRAZolam Prudy Feeler) 0.5 MG tablet Take 0.5 mg by mouth daily. At 4 pm   Yes Historical Provider, MD  amphetamine-dextroamphetamine (ADDERALL) 20 MG tablet Take 10 mg by mouth 3 (three) times daily.    Yes Historical Provider, MD  diphenhydrAMINE (BENADRYL) 25 MG tablet Take 25 mg by mouth at bedtime as needed for allergies.   Yes Historical Provider, MD  levonorgestrel (MIRENA) 20 MCG/24HR IUD 1 each by Intrauterine route once.   Yes Historical Provider, MD  loratadine (CLARITIN) 10 MG tablet Take 10 mg by mouth daily as needed for allergies.   Yes Historical Provider, MD  Multiple Vitamin (MULTIVITAMIN WITH MINERALS) TABS tablet Take 1 tablet by mouth daily.   Yes Historical Provider, MD  oxcarbazepine (TRILEPTAL) 600 MG tablet Take 900 mg by mouth 2 (two) times daily.   Yes Historical Provider, MD  Probiotic Product (PROBIOTIC PO) Take 1 capsule by mouth daily.   Yes Historical Provider, MD  clindamycin (CLEOCIN) 150 MG capsule Take 2 capsules (300 mg total) by mouth 3 (three) times daily. Patient not taking: Reported on 02/02/2015 10/21/14   Eyvonne Mechanic, PA-C  dolutegravir (TIVICAY) 50 MG tablet Take 1 tablet (50 mg total) by mouth daily. Patient not taking: Reported on 02/02/2015 12/20/14   Joycie Peek, PA-C  emtricitabine-tenofovir (TRUVADA) 200-300 MG per tablet Take 1 tablet by mouth daily. Patient not taking: Reported on 02/02/2015 12/20/14   Joycie Peek, PA-C  ondansetron (ZOFRAN) 4 MG tablet Take 1 tablet (4 mg total) by mouth every 6 (six) hours. Patient not taking: Reported on 02/02/2015 12/20/14   Joycie Peek, PA-C  phenazopyridine (PYRIDIUM) 200 MG tablet Take 1 tablet (200 mg total) by mouth 3 (three) times daily. Patient not taking: Reported on 02/02/2015 12/11/14   Hope Orlene Och, NP   BP 125/79 mmHg  Pulse 93  Temp(Src) 98.7 F (37.1 C) (Oral)  Resp 15  SpO2 100% Physical Exam  Constitutional: She appears well-developed and well-nourished.  Anxious  appearing, mildly tearful  HENT:  Head: Normocephalic and atraumatic.  Mouth/Throat: Oropharynx is clear and moist. No oropharyngeal exudate.  Tympanic membranes visualized and clear bilaterally, no effusions, erythema  Eyes: Conjunctivae and EOM are normal. Pupils are equal, round, and reactive to light. Right eye exhibits no discharge. Left eye exhibits no discharge. No scleral icterus.  Neck: Normal range of motion. Neck supple. No JVD present. No thyromegaly present.  Cardiovascular: Regular rhythm, normal heart sounds and intact distal pulses.  Exam reveals no gallop and no friction rub.   No murmur heard. Heart rate of 105, normal pulses  Pulmonary/Chest: Effort normal and breath sounds normal. No respiratory distress. She has no wheezes. She has no rales.  Abdominal: Soft. Bowel sounds are normal. She exhibits no  distension and no mass. There is no tenderness.  Musculoskeletal: Normal range of motion. She exhibits no edema or tenderness.  Lymphadenopathy:    She has no cervical adenopathy.  Neurological: She is alert. Coordination normal.  Normal speech, normal coordination, normal finger-nose-finger, normal strength in all 4 extremities, cranial nerves III through XII appear intact, visual acuity is grossly normal, pupillary exam is normal, no nystagmus  Skin: Skin is warm and dry. No rash noted. No erythema.  Psychiatric: She has a normal mood and affect. Her behavior is normal.  Nursing note and vitals reviewed.   ED Course  Procedures (including critical care time) Labs Review Labs Reviewed  COMPREHENSIVE METABOLIC PANEL - Abnormal; Notable for the following:    Glucose, Bld 100 (*)    All other components within normal limits  CBC  URINALYSIS, ROUTINE W REFLEX MICROSCOPIC (NOT AT [facility])  I-STAT BETA HCG BLOOD, ED ([facility], [facility], [facility] ONLY)    Imaging Review No results found. I have personally reviewed and evaluated these images and lab results as part of my medical  decision-making.   EKG Interpretation   Date/Time:  Saturday February 02 2015 13:56:45 EDT Ventricular Rate:  104 PR Interval:  147 QRS Duration: 87 QT Interval:  347 QTC Calculation: 456 R Axis:   66 Text Interpretation:  Sinus tachycardia Since last tracing rate slower  Confirmed by Gregori Abril  MD, Deerica Waszak ([id number]) on 02/02/2015 2:19:34 PM      MDM   Final diagnoses:  Numbness around mouth    Overall the patient appears anxious, she has no focal neurologic deficits despite feeling that she has facial numbness in her bilateral lower face, she has visual abnormalities that she reports despite having no nystagmus, her balance is off but has no finger-nose-finger difficulties. This could be anxiety state, this could also be related to the brain or serum abnormality with electrolytes, check labs, MRI, anticipate discharge if all normal.  Discussed the results with the patient, she is now well-appearing, opening her eyes, stating that her symptoms are all essentially gone except for some numbness around the mouth and on the tip of her nose, MRI report is pending at the time of change of shift, Dr. Verdie Mosher to f/u results and dispo accordingly.  Eber Hong, MD 02/02/15 [phone]

## 2015-05-05 NOTE — L&D Delivery Note (Signed)
Delivery Note Pt with strong urge to push, Pushed for 3 contractions for delivery.  At 1:07 [name] a viable and healthy female was delivered via Vaginal, Spontaneous Delivery (Presentation: OA, LOT ).  APGAR: 9, 10; weight P.   Placenta status: delivered, intact.  Cord: 3 vessel  with the following complications: none.  Anesthesia:  epidural Episiotomy: None Lacerations: 2nd degree Suture Repair: 3.0 vicryl rapide Est. Blood Loss (mL):  150  Mom to postpartum.  Baby to Couplet care / Skin to Skin.  Bovard-Stuckert, Christina [name] Christina [name], Christina [name]  Br/A neg/RNI/Tdap in PNC/PP BTL - interval  D/W pt tubal ligation, desires interval - not with this admission  D/W r/b/a of circumcision for female infant.

## 2015-06-18 LAB — OB RESULTS CONSOLE GC/CHLAMYDIA
Chlamydia: NEGATIVE
Gonorrhea: NEGATIVE

## 2015-06-18 LAB — OB RESULTS CONSOLE ABO/RH: RH TYPE: NEGATIVE

## 2015-06-18 LAB — OB RESULTS CONSOLE HEPATITIS B SURFACE ANTIGEN: HEP B S AG: NEGATIVE

## 2015-06-18 LAB — OB RESULTS CONSOLE HIV ANTIBODY (ROUTINE TESTING): HIV: NONREACTIVE

## 2015-06-18 LAB — OB RESULTS CONSOLE ANTIBODY SCREEN: ANTIBODY SCREEN: NEGATIVE

## 2015-06-18 LAB — OB RESULTS CONSOLE RPR: RPR: NONREACTIVE

## 2015-06-18 LAB — OB RESULTS CONSOLE RUBELLA ANTIBODY, IGM: Rubella: NON-IMMUNE/NOT IMMUNE

## 2015-06-20 ENCOUNTER — Ambulatory Visit: Payer: BLUE CROSS/BLUE SHIELD | Admitting: *Deleted

## 2015-08-20 ENCOUNTER — Emergency Department (HOSPITAL_COMMUNITY)
Admission: EM | Admit: 2015-08-20 | Discharge: 2015-08-20 | Disposition: A | Discharge status: Home / Self Care | Payer: BLUE CROSS/BLUE SHIELD | Attending: Emergency Medicine | Admitting: Emergency Medicine

## 2015-08-20 ENCOUNTER — Encounter (HOSPITAL_COMMUNITY): Payer: Self-pay | Admitting: Emergency Medicine

## 2015-08-20 DIAGNOSIS — Z91040 Latex allergy status: Secondary | ICD-10-CM | POA: Diagnosis not present

## 2015-08-20 DIAGNOSIS — Z8781 Personal history of (healed) traumatic fracture: Secondary | ICD-10-CM | POA: Insufficient documentation

## 2015-08-20 DIAGNOSIS — R000 Tachycardia, unspecified: Secondary | ICD-10-CM

## 2015-08-20 DIAGNOSIS — I479 Paroxysmal tachycardia, unspecified: Secondary | ICD-10-CM | POA: Diagnosis not present

## 2015-08-20 DIAGNOSIS — I471 Supraventricular tachycardia: Secondary | ICD-10-CM

## 2015-08-20 DIAGNOSIS — Z872 Personal history of diseases of the skin and subcutaneous tissue: Secondary | ICD-10-CM | POA: Insufficient documentation

## 2015-08-20 DIAGNOSIS — Z87440 Personal history of urinary (tract) infections: Secondary | ICD-10-CM | POA: Diagnosis not present

## 2015-08-20 DIAGNOSIS — F419 Anxiety disorder, unspecified: Secondary | ICD-10-CM | POA: Diagnosis not present

## 2015-08-20 DIAGNOSIS — O99342 Other mental disorders complicating pregnancy, second trimester: Secondary | ICD-10-CM | POA: Insufficient documentation

## 2015-08-20 DIAGNOSIS — O99412 Diseases of the circulatory system complicating pregnancy, second trimester: Secondary | ICD-10-CM | POA: Insufficient documentation

## 2015-08-20 DIAGNOSIS — Z79899 Other long term (current) drug therapy: Secondary | ICD-10-CM | POA: Diagnosis not present

## 2015-08-20 DIAGNOSIS — Z3A00 Weeks of gestation of pregnancy not specified: Secondary | ICD-10-CM | POA: Insufficient documentation

## 2015-08-20 DIAGNOSIS — O99413 Diseases of the circulatory system complicating pregnancy, third trimester: Secondary | ICD-10-CM | POA: Diagnosis not present

## 2015-08-20 LAB — BASIC METABOLIC PANEL
Anion gap: 11 (ref 5–15)
BUN: 6 mg/dL (ref 6–20)
CHLORIDE: 106 mmol/L (ref 101–111)
CO2: 23 mmol/L (ref 22–32)
CREATININE: 0.46 mg/dL (ref 0.44–1.00)
Calcium: 9.2 mg/dL (ref 8.9–10.3)
GFR calc Af Amer: >60 mL/min (ref >60)
GFR calc non Af Amer: >60 mL/min (ref >60)
Glucose, Bld: 99 mg/dL (ref 65–99)
Potassium: 4 mmol/L (ref 3.5–5.1)
Sodium: 140 mmol/L (ref 135–145)

## 2015-08-20 LAB — CBC WITH DIFFERENTIAL/PLATELET
Basophils Absolute: 0 10*3/uL (ref 0.0–0.1)
Basophils Relative: 0 %
EOS ABS: 0.2 10*3/uL (ref 0.0–0.7)
Eosinophils Relative: 2 %
HCT: 32.7 % — ABNORMAL LOW (ref 36.0–46.0)
HEMOGLOBIN: 10.5 g/dL — AB (ref 12.0–15.0)
LYMPHS ABS: 1.9 10*3/uL (ref 0.7–4.0)
Lymphocytes Relative: 18 %
MCH: 29.5 pg (ref 26.0–34.0)
MCHC: 32.1 g/dL (ref 30.0–36.0)
MCV: 91.9 fL (ref 78.0–100.0)
MONO ABS: 0.9 10*3/uL (ref 0.1–1.0)
MONOS PCT: 8 %
Neutro Abs: 7.9 10*3/uL — ABNORMAL HIGH (ref 1.7–7.7)
Neutrophils Relative %: 72 %
PLATELETS: 275 10*3/uL (ref 150–400)
RBC: 3.56 MIL/uL — ABNORMAL LOW (ref 3.87–5.11)
RDW: 13.1 % (ref 11.5–15.5)
WBC: 10.9 10*3/uL — ABNORMAL HIGH (ref 4.0–10.5)

## 2015-08-20 LAB — MAGNESIUM: MAGNESIUM: 1.8 mg/dL (ref 1.7–2.4)

## 2015-08-20 NOTE — ED Notes (Signed)
Charge RN at bedside attempting to get [facility]

## 2015-08-20 NOTE — ED Notes (Signed)
Upon entering room to DC pt, pt found to not be in room. EDP aware. EDP also requesting that EKG be exported and printed, however no EKG had been exported due to no order from [facility]. Charge RN aware. Strips were printed and placed in pt chart.

## 2015-08-20 NOTE — ED Notes (Signed)
EDP at bedside  

## 2015-08-20 NOTE — ED Notes (Signed)
Pt in EMS from home, reported feeling of "heart racing" HR initially 170's, vagal maneuver HR now maintaining 120's. 5 months pregnant. No other complaints.

## 2015-08-20 NOTE — ED Notes (Signed)
Charge RN able to pick up pt HR on ultrasound, just [facility]

## 2015-08-20 NOTE — ED Provider Notes (Signed)
CSN: [id number]     Arrival date & time 08/20/15  0217 History   First MD Initiated Contact with Patient 08/20/15 [phone]     Chief Complaint  Patient presents with  . Tachycardia  . Pregnant      (Consider location/radiation/quality/duration/timing/severity/associated sxs/prior Treatment) HPI Comments: 30 year old female who is currently 5 months pregnant who presents with heart racing. The patient woke up from sleep tonight feeling her heart racing. She tried to breathing calm herself down but could not improve her symptoms. She called EMS and they noted an initial heart rate in the 170s. They performed vagal maneuvers twice and she states she immediately felt a change in her heart rate. EMS reported an improvement to the 120s. She denies any associated chest pain, shortness of breath, abdominal pain, vaginal bleeding or passage of fluid. She reports of long-standing history of tachycardia at baseline and states that it is very normal for her heart rate to run near 110. She states that she currently feels well and denies any complaints. She has been eating and drinking normally.  The history is provided by the patient.    Past Medical History  Diagnosis Date  . History of hip fracture 10/2010    still has pain, right hip  . Anxiety   . Eczema   . Hyperemesis arising during pregnancy   . Vaginitis and vulvovaginitis   . UTI (lower urinary tract infection)    Past Surgical History  Procedure Laterality Date  . Wisdom tooth extraction    . Laparoscopy Left 06/20/2012    Procedure: LAPAROSCOPY OPERATIVE;  Surgeon: Lavina Hammanodd Meisinger, MD;  Location: [facility];  Service: Gynecology;  Laterality: Left;  OPEN LAPAROSCOPY, removal of portion of left ovarian cyst wall   Family History  Problem Relation Age of Onset  . Alcohol abuse Mother   . Anxiety disorder Mother   . Asthma Mother   . Mental illness Mother     bipolar  . Depression Mother   . Hypothyroidism Mother   . Ovarian cysts Mother    . Hypertension Father   . Endometriosis Sister   . Ovarian cysts Sister   . Alcohol abuse Brother   . Crohn's disease Brother   . Alcohol abuse Maternal Aunt   . Cancer Maternal Aunt     breast  . Hypothyroidism Maternal Aunt   . Miscarriages / Stillbirths Maternal Aunt     stillbirth  . Alcohol abuse Maternal Uncle   . Asthma Maternal Grandmother   . Mental illness Maternal Grandmother     bipolar  . Asthma Maternal Grandfather   . Diabetes Maternal Grandfather   . Hypertension Maternal Grandfather   . Asthma Paternal Grandmother   . Hypertension Paternal Grandmother   . Asthma Paternal Grandfather   . Diabetes Paternal Grandfather   . Heart disease Paternal Grandfather   . Hypertension Paternal Grandfather    Social History  Substance Use Topics  . Smoking status: Never Smoker   . Smokeless tobacco: None  . Alcohol Use: No   OB History    Gravida Para Term Preterm AB TAB SAB Ectopic Multiple Living   3 2 2  0 0 0 0 0 0 2     Review of Systems    Allergies  Sulfa antibiotics and Latex  Home Medications   Prior to Admission medications   Medication Sig Start Date End Date Taking? Authorizing Provider  Prenatal Vit-Fe Fumarate-FA (PRENATAL MULTIVITAMIN) TABS tablet Take 2 tablets by mouth daily at 12  noon. gummy   Yes Historical Provider, MD  adapalene (DIFFERIN) 0.1 % cream Apply 1 application topically 2 (two) times daily.    Historical Provider, MD  ALPRAZolam (XANAX XR) 1 MG 24 hr tablet Take 1 mg by mouth daily at 12 noon. At noon    Historical Provider, MD  ALPRAZolam Prudy Feeler) 0.5 MG tablet Take 0.5 mg by mouth daily. At 4 pm    Historical Provider, MD  amphetamine-dextroamphetamine (ADDERALL) 20 MG tablet Take 10 mg by mouth 3 (three) times daily.     Historical Provider, MD  clindamycin (CLEOCIN) 150 MG capsule Take 2 capsules (300 mg total) by mouth 3 (three) times daily. Patient not taking: Reported on 02/02/2015 10/21/14   Eyvonne Mechanic, PA-C   diphenhydrAMINE (BENADRYL) 25 MG tablet Take 25 mg by mouth at bedtime as needed for allergies.    Historical Provider, MD  dolutegravir (TIVICAY) 50 MG tablet Take 1 tablet (50 mg total) by mouth daily. Patient not taking: Reported on 02/02/2015 12/20/14   Joycie Peek, PA-C  emtricitabine-tenofovir (TRUVADA) 200-300 MG per tablet Take 1 tablet by mouth daily. Patient not taking: Reported on 02/02/2015 12/20/14   Joycie Peek, PA-C  levonorgestrel (MIRENA) 20 MCG/24HR IUD 1 each by Intrauterine route once.    Historical Provider, MD  loratadine (CLARITIN) 10 MG tablet Take 10 mg by mouth daily as needed for allergies.    Historical Provider, MD  Multiple Vitamin (MULTIVITAMIN WITH MINERALS) TABS tablet Take 1 tablet by mouth daily.    Historical Provider, MD  ondansetron (ZOFRAN) 4 MG tablet Take 1 tablet (4 mg total) by mouth every 6 (six) hours. Patient not taking: Reported on 02/02/2015 12/20/14   Joycie Peek, PA-C  oxcarbazepine (TRILEPTAL) 600 MG tablet Take 900 mg by mouth 2 (two) times daily.    Historical Provider, MD  phenazopyridine (PYRIDIUM) 200 MG tablet Take 1 tablet (200 mg total) by mouth 3 (three) times daily. Patient not taking: Reported on 02/02/2015 12/11/14   Janne Napoleon, NP  Probiotic Product (PROBIOTIC PO) Take 1 capsule by mouth daily.    Historical Provider, MD   BP 110/71 mmHg  Pulse 112  Temp(Src) 98.4 F (36.9 C) (Oral)  Resp 16  Ht  (1.702 m)  Wt 200 lb (90.719 kg)  BMI 31.32 kg/m2  SpO2 98% Physical Exam  Constitutional: She is oriented to person, place, and time. She appears well-developed and well-nourished. No distress.  HENT:  Head: Normocephalic and atraumatic.  Moist mucous membranes  Eyes: Conjunctivae are normal. Pupils are equal, round, and reactive to light.  Neck: Neck supple.  Cardiovascular: Regular rhythm and normal heart sounds.  Tachycardia present.   No murmur heard. Pulmonary/Chest: Effort normal and breath sounds normal.   Abdominal: Soft. Bowel sounds are normal. There is no tenderness.  Uterus palpable near umbilicus  Musculoskeletal: She exhibits no edema.  Neurological: She is alert and oriented to person, place, and time.  Fluent speech  Skin: Skin is warm and dry.  Psychiatric: She has a normal mood and affect. Judgment normal.  Nursing note and vitals reviewed.   ED Course  Procedures (including critical care time)  EMERGENCY DEPARTMENT [place] PREGNANCY "Study: Limited Ultrasound of the Pelvis for Pregnancy"  INDICATIONS:Tachycardia Multiple views of the uterus and pelvic cavity were obtained in real-time with a multi-frequency probe.  APPROACH:Transabdominal   PERFORMED BY: Myself  IMAGES ARCHIVED?: Yes  LIMITATIONS: None  PREGNANCY FREE FLUID: None  ADNEXAL FINDINGS: not assessed  PREGNANCY FINDINGS: Fetal  heart activity seen  INTERPRETATION: Viable intrauterine pregnancy  GESTATIONAL AGE, ESTIMATE:  Not assessed  FETAL HEART RATE: 158    Labs Review Labs Reviewed  CBC WITH DIFFERENTIAL/PLATELET - Abnormal; Notable for the following:    WBC 10.9 (*)    RBC 3.56 (*)    Hemoglobin 10.5 (*)    HCT 32.7 (*)    Neutro Abs 7.9 (*)    All other components within normal limits  BASIC METABOLIC PANEL  MAGNESIUM    Imaging Review No results found. I have personally reviewed and evaluated these lab results as part of my medical decision-making.   EKG Interpretation None      MDM   Final diagnoses:  Paroxysmal SVT (supraventricular tachycardia) (HCC)  Sinus tachycardia (HCC)   Patient who is approximately 5 months pregnant presents with feeling of heart racing sensation that woke her from sleep tonight. She was initially in the 170s by EMS and improved to the 120s after vagal maneuvers in route. On arrival, patient was awake and alert, comfortable. She was in sinus rhythm on the monitor, rate 110-115. No complaints of chest pain or SOB. Obtained above labs which showed  anemia w/ Hgb 10.5; pt's previous labs were prior to pregnancy therefore I suspect that her current anemia is anemia of pregnancy. I have informed her of this finding and instructed to begin iron supplementation and discuss further with her OBGYN. On multiple examinations, the patient has been resting comfortably. We had an extensive discussion regarding her tachycardia. She states that she has a long-standing history of elevated heart rate for which she has followed with her PCP and her OB/GYN is aware of. She denies any complaints and has been eating and drinking normally, therefore I doubt dehydration. I offered a fluid bolus but the patient preferred PO fluids. I reviewed multiple previous ED visits which have all documented some degree of tachycardia. The patient strongly requested no extensive evaluation for the tachycardia as she is adamant that this heart rate is normal for her. I do suspect she may have had an episode of SVT at home as her HR was 170s per EMS and responded to vagal maneuvers; the patient also reports an immediate improvement in her symptoms after the vagal maneuvers. I discussed SVT management at home and instructed to seek immediate medical attention if she is unable to resolve an episode with 2-3 attempts at vagal maneuvers at home.   I reviewed pt's labwork and chart prior to discharging and requested printed EKG prior to discharge. The nurse went to obtain EKG and the patient had eloped.   Laurence Spates, MD 08/21/15 [phone]

## 2015-08-20 NOTE — ED Notes (Signed)
EDP at bedside, looking at fetus with ultrasound

## 2015-09-02 ENCOUNTER — Ambulatory Visit (INDEPENDENT_AMBULATORY_CARE_PROVIDER_SITE_OTHER): Payer: BLUE CROSS/BLUE SHIELD | Admitting: Internal Medicine

## 2015-09-02 ENCOUNTER — Encounter: Payer: Self-pay | Admitting: Internal Medicine

## 2015-09-02 VITALS — BP 108/62 | HR 94 | Ht 67.0 in | Wt 227.6 lb

## 2015-09-02 DIAGNOSIS — R002 Palpitations: Secondary | ICD-10-CM

## 2015-09-02 DIAGNOSIS — R000 Tachycardia, unspecified: Secondary | ICD-10-CM | POA: Diagnosis not present

## 2015-09-02 DIAGNOSIS — R0602 Shortness of breath: Secondary | ICD-10-CM

## 2015-09-02 NOTE — Progress Notes (Signed)
Cardiology Office Note   Date:  09/02/2015   ID:  Christina [name], DOB April 08, 1986, MRN [id number]  PCP:   Christina Lope, MD  Cardiologist:   Christina Pates, MD   Pt referred by Christina [name] for eval of probable SVT     History of Present Illness: Christina [name] is a 30 y.o. female with a history of probable  SVT   On 4/18,  Pt woke up for sleep with heart racing  Called EMS  HR in 170s Vagal maneuvers x 2  Felt change in HR to120s   Hx of tachycardia  HR can be in 110 range    Has had very fast HR in the past Had one spell about every other month for the past couple years  One time almost passed out  HR was  Up to 220  Now pregnant   During this preg has had 2 spell  One wehre EMS came because didn'Christina stop Lasted about 30 min  Pt blew through a syringe  3 time stopped but then came back Last spell was this past saruday  Was sitting  Lasted 5 to 10  Didn'Christina get dizzy    This pregnanyc is very different from other 2  Did not have racing HR with others  Worked up to 40 years  Now SOB with activity Wt up 40 to 50 lbs   Other  Outpatient Prescriptions Prior to Visit  Medication Sig Dispense Refill  . Prenatal Vit-Fe Fumarate-FA (PRENATAL MULTIVITAMIN) TABS tablet Take 2 tablets by mouth daily at 12 noon. Reported on 09/02/2015    . adapalene (DIFFERIN) 0.1 % cream Apply 1 application topically 2 (two) times daily. Reported on 09/02/2015    . ALPRAZolam (XANAX XR) 1 MG 24 hr tablet Take 1 mg by mouth daily at 12 noon. Reported on 09/02/2015    . ALPRAZolam (XANAX) 0.5 MG tablet Take 0.5 mg by mouth daily. Reported on 09/02/2015    . amphetamine-dextroamphetamine (ADDERALL) 20 MG tablet Take 10 mg by mouth 3 (three) times daily. Reported on 09/02/2015    . clindamycin (CLEOCIN) 150 MG capsule Take 2 capsules (300 mg total) by mouth 3 (three) times daily. (Patient not taking: Reported on 02/02/2015) 60 capsule 0  . diphenhydrAMINE (BENADRYL) 25 MG tablet Take 25 mg by mouth at bedtime as needed for  allergies. Reported on 09/02/2015    . dolutegravir (TIVICAY) 50 MG tablet Take 1 tablet (50 mg total) by mouth daily. (Patient not taking: Reported on 02/02/2015) 28 tablet 0  . emtricitabine-tenofovir (TRUVADA) 200-300 MG per tablet Take 1 tablet by mouth daily. (Patient not taking: Reported on 02/02/2015) 28 tablet 0  . levonorgestrel (MIRENA) 20 MCG/24HR IUD 1 each by Intrauterine route once. Reported on 09/02/2015    . loratadine (CLARITIN) 10 MG tablet Take 10 mg by mouth daily as needed for allergies. Reported on 09/02/2015    . Multiple Vitamin (MULTIVITAMIN WITH MINERALS) TABS tablet Take 1 tablet by mouth daily. Reported on 09/02/2015    . ondansetron (ZOFRAN) 4 MG tablet Take 1 tablet (4 mg total) by mouth every 6 (six) hours. (Patient not taking: Reported on 02/02/2015) 20 tablet 0  . oxcarbazepine (TRILEPTAL) 600 MG tablet Take 900 mg by mouth 2 (two) times daily. Reported on 09/02/2015    . phenazopyridine (PYRIDIUM) 200 MG tablet Take 1 tablet (200 mg total) by mouth 3 (three) times daily. (Patient not taking: Reported on 02/02/2015) 6 tablet 0  . Probiotic Product (  PROBIOTIC PO) Take 1 capsule by mouth daily. Reported on 09/02/2015     No facility-administered medications prior to visit.     Allergies:   Sulfa antibiotics and Latex   Past Medical History  Diagnosis Date  . History of hip fracture 10/2010    still has pain, right hip  . Anxiety   . Eczema   . Hyperemesis arising during pregnancy   . Vaginitis and vulvovaginitis   . UTI (lower urinary tract infection)     Past Surgical History  Procedure Laterality Date  . Wisdom tooth extraction    . Laparoscopy Left 06/20/2012    Procedure: LAPAROSCOPY OPERATIVE;  Surgeon: Christina Hamman, MD;  Location: [facility];  Service: Gynecology;  Laterality: Left;  OPEN LAPAROSCOPY, removal of portion of left ovarian cyst wall     Social History:  The patient  reports that she has never smoked. She does not have any smokeless tobacco history on  file. She reports that she uses illicit drugs (Marijuana). She reports that she does not drink alcohol.   Family History:  The patient's family history includes Alcohol abuse in her brother, maternal aunt, maternal uncle, and mother; Anxiety disorder in her mother; Asthma in her maternal grandfather, maternal grandmother, mother, paternal grandfather, and paternal grandmother; Cancer in her maternal aunt; Crohn's disease in her brother; Depression in her mother; Diabetes in her maternal grandfather and paternal grandfather; Endometriosis in her sister; Heart disease in her paternal grandfather; Hypertension in her father, maternal grandfather, paternal grandfather, and paternal grandmother; Hypothyroidism in her maternal aunt and mother; Mental illness in her maternal grandmother and mother; Miscarriages / [place] in her maternal aunt; Ovarian cysts in her mother and sister.    ROS:  Please see the history of present illness. All other systems are reviewed and  Negative to the above problem except as noted.    PHYSICAL EXAM: VS:  Ht 5\' 7"  (1.702 m)  Wt 227 lb 9.6 oz (103.239 kg)  BMI 35.64 kg/m2  GEN: Pt is in n no acute distress HEENT: normal Neck: no JVD, carotid bruits, or masses Cardiac: RRR; no murmurs, rubs, or gallops,no edema  Respiratory:  clear to auscultation bilaterally, normal work of breathing GI: distended  nontender    MS: no deformity Moving all extremities   Skin: warm and dry, no rash Neuro:  Strength and sensation are intact Psych: euthymic mood, full affect   EKG:  EKG is ordered today.  SR  94 bpm       Lipid Panel No results found for: CHOL, TRIG, HDL, CHOLHDL, VLDL, LDLCALC, LDLDIRECT    Wt Readings from Last 3 Encounters:  09/02/15 227 lb 9.6 oz (103.239 kg)  08/20/15 200 lb (90.719 kg)  10/20/14 180 lb (81.647 kg)      ASSESSMENT AND PLAN: 1  Tachycardia  Probable SVT  Converted with vagal maneuvers. Has had intermitt in past  No syncope, close  one time  Not clear how many spells she will have before delivery  Biggest risk is syncope with injury to patient , not fetus. Reviewed mechanisms with pt  For now would recomm conservative measures  Vagal maneuvers  Watch for dizziness  May need [place] to ER if antoer spell happens   After delivery should be seen by EP  The spells have never been documented  I would set up for event mnitor I would also set up for echo  Cardiac exam appears normal but with SOB would confirm  F/U will  be based on test results and further spells        Signed, Christina PatesPaula Ross, MD  09/02/2015 12:20 PM    [facility] [address], [place], [place]  [id number] Phone: [phone]) [phone]; Fax: [phone]) [phone]

## 2015-09-02 NOTE — Patient Instructions (Addendum)
Your physician recommends that you continue on your current medications as directed. Please refer to the Current Medication list given to you today. Your physician has requested that you have an echocardiogram. Echocardiography is a painless test that uses sound waves to create images of your heart. It provides your doctor with information about the size and shape of your heart and how well your heart's chambers and valves are working. This procedure takes approximately one hour. There are no restrictions for this procedure.  Your physician has recommended that you wear an event monitor. Event monitors are medical devices that record the heart's electrical activity. Doctors most often us these monitors to diagnose arrhythmias. Arrhythmias are problems with the speed or rhythm of the heartbeat. The monitor is a small, portable device. You can wear one while you do your normal daily activities. This is usually used to diagnose what is causing palpitations/syncope (passing out).  

## 2015-09-13 ENCOUNTER — Ambulatory Visit (HOSPITAL_COMMUNITY): Payer: BLUE CROSS/BLUE SHIELD | Attending: Cardiovascular Disease

## 2015-09-13 ENCOUNTER — Other Ambulatory Visit: Payer: Self-pay

## 2015-09-13 ENCOUNTER — Ambulatory Visit (INDEPENDENT_AMBULATORY_CARE_PROVIDER_SITE_OTHER): Payer: BLUE CROSS/BLUE SHIELD

## 2015-09-13 DIAGNOSIS — R000 Tachycardia, unspecified: Secondary | ICD-10-CM | POA: Insufficient documentation

## 2015-09-13 DIAGNOSIS — R0602 Shortness of breath: Secondary | ICD-10-CM

## 2015-09-13 DIAGNOSIS — R002 Palpitations: Secondary | ICD-10-CM

## 2015-09-13 DIAGNOSIS — Z331 Pregnant state, incidental: Secondary | ICD-10-CM | POA: Insufficient documentation

## 2015-09-13 DIAGNOSIS — R0600 Dyspnea, unspecified: Secondary | ICD-10-CM | POA: Diagnosis present

## 2015-10-28 ENCOUNTER — Telehealth: Payer: Self-pay | Admitting: Internal Medicine

## 2015-10-28 NOTE — Telephone Encounter (Signed)
F/u Message  Pt states she received a call. I could not find a note. Pt states a barbara called. But no detailed message.. Please call back to discuss

## 2015-10-29 NOTE — Telephone Encounter (Signed)
Spoke w/ pt and informed her that I didn't call her. Pt verrbalized understanding and stated that if it was important the person would call her back.

## 2015-11-07 ENCOUNTER — Encounter: Payer: Self-pay | Admitting: Internal Medicine

## 2015-11-28 LAB — OB RESULTS CONSOLE GBS: STREP GROUP B AG: NEGATIVE

## 2015-11-30 ENCOUNTER — Encounter (HOSPITAL_COMMUNITY): Payer: Self-pay

## 2015-11-30 ENCOUNTER — Inpatient Hospital Stay (HOSPITAL_COMMUNITY)
Admission: AD | Admit: 2015-11-30 | Discharge: 2015-11-30 | Disposition: A | Discharge status: Home / Self Care | Payer: BLUE CROSS/BLUE SHIELD | Source: Ambulatory Visit | Attending: Obstetrics and Gynecology | Admitting: Obstetrics and Gynecology

## 2015-11-30 DIAGNOSIS — O471 False labor at or after 37 completed weeks of gestation: Secondary | ICD-10-CM | POA: Insufficient documentation

## 2015-11-30 MED ORDER — TERBUTALINE SULFATE 1 MG/ML IJ SOLN
0.2500 mg | Freq: Once | INTRAMUSCULAR | Status: DC
  Filled 2015-11-30: qty 1

## 2015-11-30 MED ORDER — LACTATED RINGERS IV BOLUS (SEPSIS)
500.0000 mL | Freq: Once | INTRAVENOUS | Status: AC
  Administered 2015-11-30: 500 mL via INTRAVENOUS

## 2015-11-30 NOTE — MAU Note (Signed)
Been having contractions all morning and has gotten worse since 2pm.

## 2015-11-30 NOTE — MAU Note (Signed)
Pt decided she didn't want the terb. She would rather go home and try to rest and will come back if contractions get worse.

## 2015-12-07 ENCOUNTER — Inpatient Hospital Stay (HOSPITAL_COMMUNITY)
Admission: AD | Admit: 2015-12-07 | Discharge: 2015-12-07 | Disposition: A | Discharge status: Home / Self Care | Payer: BLUE CROSS/BLUE SHIELD | Source: Ambulatory Visit | Attending: Obstetrics and Gynecology | Admitting: Obstetrics and Gynecology

## 2015-12-07 ENCOUNTER — Encounter (HOSPITAL_COMMUNITY): Payer: Self-pay | Admitting: *Deleted

## 2015-12-07 DIAGNOSIS — Z825 Family history of asthma and other chronic lower respiratory diseases: Secondary | ICD-10-CM | POA: Diagnosis not present

## 2015-12-07 DIAGNOSIS — Z91040 Latex allergy status: Secondary | ICD-10-CM | POA: Insufficient documentation

## 2015-12-07 DIAGNOSIS — O368130 Decreased fetal movements, third trimester, not applicable or unspecified: Secondary | ICD-10-CM | POA: Diagnosis not present

## 2015-12-07 DIAGNOSIS — Z8249 Family history of ischemic heart disease and other diseases of the circulatory system: Secondary | ICD-10-CM | POA: Insufficient documentation

## 2015-12-07 DIAGNOSIS — Z3A37 37 weeks gestation of pregnancy: Secondary | ICD-10-CM | POA: Diagnosis not present

## 2015-12-07 DIAGNOSIS — Z882 Allergy status to sulfonamides status: Secondary | ICD-10-CM | POA: Diagnosis not present

## 2015-12-07 DIAGNOSIS — Z8349 Family history of other endocrine, nutritional and metabolic diseases: Secondary | ICD-10-CM | POA: Insufficient documentation

## 2015-12-07 DIAGNOSIS — O368131 Decreased fetal movements, third trimester, fetus 1: Secondary | ICD-10-CM

## 2015-12-07 NOTE — Discharge Instructions (Signed)
Fetal Movement Counts  Patient Name: __________________________________________________ Patient Due Date: ____________________  Performing a fetal movement count is highly recommended in high-risk pregnancies, but it is good for every pregnant woman to do. Your health care provider may ask you to start counting fetal movements at 28 weeks of the pregnancy. Fetal movements often increase:  · After eating a full meal.  · After physical activity.  · After eating or drinking something sweet or cold.  · At rest.  Pay attention to when you feel the baby is most active. This will help you notice a pattern of your baby's sleep and wake cycles and what factors contribute to an increase in fetal movement. It is important to perform a fetal movement count at the same time each day when your baby is normally most active.   HOW TO COUNT FETAL MOVEMENTS  1. Find a quiet and comfortable area to sit or lie down on your left side. Lying on your left side provides the best blood and oxygen circulation to your baby.  2. Write down the day and time on a sheet of paper or in a journal.  3. Start counting kicks, flutters, swishes, rolls, or jabs in a 2-hour period. You should feel at least 10 movements within 2 hours.  4. If you do not feel 10 movements in 2 hours, wait 2-3 hours and count again. Look for a change in the pattern or not enough counts in 2 hours.  SEEK MEDICAL CARE IF:  · You feel less than 10 counts in 2 hours, tried twice.  · There is no movement in over an hour.  · The pattern is changing or taking longer each day to reach 10 counts in 2 hours.  · You feel the baby is not moving as he or she usually does.  Date: ____________ Movements: ____________ Start time: ____________ Finish time: ____________   Date: ____________ Movements: ____________ Start time: ____________ Finish time: ____________  Date: ____________ Movements: ____________ Start time: ____________ Finish time: ____________  Date: ____________ Movements:  ____________ Start time: ____________ Finish time: ____________  Date: ____________ Movements: ____________ Start time: ____________ Finish time: ____________  Date: ____________ Movements: ____________ Start time: ____________ Finish time: ____________  Date: ____________ Movements: ____________ Start time: ____________ Finish time: ____________  Date: ____________ Movements: ____________ Start time: ____________ Finish time: ____________   Date: ____________ Movements: ____________ Start time: ____________ Finish time: ____________  Date: ____________ Movements: ____________ Start time: ____________ Finish time: ____________  Date: ____________ Movements: ____________ Start time: ____________ Finish time: ____________  Date: ____________ Movements: ____________ Start time: ____________ Finish time: ____________  Date: ____________ Movements: ____________ Start time: ____________ Finish time: ____________  Date: ____________ Movements: ____________ Start time: ____________ Finish time: ____________  Date: ____________ Movements: ____________ Start time: ____________ Finish time: ____________   Date: ____________ Movements: ____________ Start time: ____________ Finish time: ____________  Date: ____________ Movements: ____________ Start time: ____________ Finish time: ____________  Date: ____________ Movements: ____________ Start time: ____________ Finish time: ____________  Date: ____________ Movements: ____________ Start time: ____________ Finish time: ____________  Date: ____________ Movements: ____________ Start time: ____________ Finish time: ____________  Date: ____________ Movements: ____________ Start time: ____________ Finish time: ____________  Date: ____________ Movements: ____________ Start time: ____________ Finish time: ____________   Date: ____________ Movements: ____________ Start time: ____________ Finish time: ____________  Date: ____________ Movements: ____________ Start time: ____________ Finish  time: ____________  Date: ____________ Movements: ____________ Start time: ____________ Finish time: ____________  Date: ____________ Movements: ____________ Start time:   ____________ Finish time: ____________  Date: ____________ Movements: ____________ Start time: ____________ Finish time: ____________  Date: ____________ Movements: ____________ Start time: ____________ Finish time: ____________  Date: ____________ Movements: ____________ Start time: ____________ Finish time: ____________   Date: ____________ Movements: ____________ Start time: ____________ Finish time: ____________  Date: ____________ Movements: ____________ Start time: ____________ Finish time: ____________  Date: ____________ Movements: ____________ Start time: ____________ Finish time: ____________  Date: ____________ Movements: ____________ Start time: ____________ Finish time: ____________  Date: ____________ Movements: ____________ Start time: ____________ Finish time: ____________  Date: ____________ Movements: ____________ Start time: ____________ Finish time: ____________  Date: ____________ Movements: ____________ Start time: ____________ Finish time: ____________   Date: ____________ Movements: ____________ Start time: ____________ Finish time: ____________  Date: ____________ Movements: ____________ Start time: ____________ Finish time: ____________  Date: ____________ Movements: ____________ Start time: ____________ Finish time: ____________  Date: ____________ Movements: ____________ Start time: ____________ Finish time: ____________  Date: ____________ Movements: ____________ Start time: ____________ Finish time: ____________  Date: ____________ Movements: ____________ Start time: ____________ Finish time: ____________  Date: ____________ Movements: ____________ Start time: ____________ Finish time: ____________   Date: ____________ Movements: ____________ Start time: ____________ Finish time: ____________  Date: ____________  Movements: ____________ Start time: ____________ Finish time: ____________  Date: ____________ Movements: ____________ Start time: ____________ Finish time: ____________  Date: ____________ Movements: ____________ Start time: ____________ Finish time: ____________  Date: ____________ Movements: ____________ Start time: ____________ Finish time: ____________  Date: ____________ Movements: ____________ Start time: ____________ Finish time: ____________  Date: ____________ Movements: ____________ Start time: ____________ Finish time: ____________   Date: ____________ Movements: ____________ Start time: ____________ Finish time: ____________  Date: ____________ Movements: ____________ Start time: ____________ Finish time: ____________  Date: ____________ Movements: ____________ Start time: ____________ Finish time: ____________  Date: ____________ Movements: ____________ Start time: ____________ Finish time: ____________  Date: ____________ Movements: ____________ Start time: ____________ Finish time: ____________  Date: ____________ Movements: ____________ Start time: ____________ Finish time: ____________     This information is not intended to replace advice given to you by your health care provider. Make sure you discuss any questions you have with your health care provider.     Document Released: 05/20/2006 Document Revised: 05/11/2014 Document Reviewed: 02/15/2012  Elsevier Interactive Patient Education ©2016 [employer]

## 2015-12-07 NOTE — MAU Note (Signed)
Decreased fetal movent today. Pt had not felt him move since around noon. Pt reports that she is contracting, but has been for a while. Denies leaking of fluid. Pt reports some spotting last night, but no further bleeding today. Pt is concerned because she is having swelling in her legs and feet. The top of her feet feel numb because of the swelling.

## 2015-12-07 NOTE — MAU Provider Note (Signed)
History   G3P2002 @ 37 wks in with c/o decreased fetal movement today. No other complaints.  CSN: [id number]  Arrival date & time 12/07/15  1910   First Provider Initiated Contact with Patient 12/07/15 1941      Chief Complaint  Patient presents with  . Decreased Fetal Movement    HPI  Past Medical History:  Diagnosis Date  . Anxiety   . Eczema   . History of hip fracture 10/2010   still has pain, right hip  . Hyperemesis arising during pregnancy   . UTI (lower urinary tract infection)   . Vaginitis and vulvovaginitis     Past Surgical History:  Procedure Laterality Date  . LAPAROSCOPY Left 06/20/2012   Procedure: LAPAROSCOPY OPERATIVE;  Surgeon: Lavina Hamman, MD;  Location: [facility];  Service: Gynecology;  Laterality: Left;  OPEN LAPAROSCOPY, removal of portion of left ovarian cyst wall  . WISDOM TOOTH EXTRACTION      Family History  Problem Relation Age of Onset  . Alcohol abuse Mother   . Anxiety disorder Mother   . Asthma Mother   . Mental illness Mother     bipolar  . Depression Mother   . Hypothyroidism Mother   . Ovarian cysts Mother   . Hypertension Father   . Endometriosis Sister   . Ovarian cysts Sister   . Alcohol abuse Brother   . Crohn's disease Brother   . Alcohol abuse Maternal Aunt   . Cancer Maternal Aunt     breast  . Hypothyroidism Maternal Aunt   . Miscarriages / Stillbirths Maternal Aunt     stillbirth  . Alcohol abuse Maternal Uncle   . Asthma Maternal Grandmother   . Mental illness Maternal Grandmother     bipolar  . Asthma Maternal Grandfather   . Diabetes Maternal Grandfather   . Hypertension Maternal Grandfather   . Asthma Paternal Grandmother   . Hypertension Paternal Grandmother   . Asthma Paternal Grandfather   . Diabetes Paternal Grandfather   . Heart disease Paternal Grandfather   . Hypertension Paternal Grandfather     Social History  Substance Use Topics  . Smoking status: Never Smoker  . Smokeless tobacco: Never  Used  . Alcohol use No    OB History    Gravida Para Term Preterm AB Living   3 2 2  0 0 2   SAB TAB Ectopic Multiple Live Births   0 0 0 0 2      Review of Systems  Constitutional: Negative.   HENT: Negative.   Eyes: Negative.   Respiratory: Negative.   Cardiovascular: Negative.   Gastrointestinal: Negative.   Endocrine: Negative.   Genitourinary: Negative.   Musculoskeletal: Negative.   Skin: Negative.   Allergic/Immunologic: Negative.   Neurological: Negative.   Hematological: Negative.   Psychiatric/Behavioral: Negative.     Allergies  Sulfa antibiotics and Latex  Home Medications    BP 104/70 (BP Location: Right Arm)   Pulse 104   Resp 18   Physical Exam  Constitutional: She is oriented to person, place, and time. She appears well-developed and well-nourished.  HENT:  Head: Normocephalic.  Eyes: Pupils are equal, round, and reactive to light.  Neck: Normal range of motion.  Cardiovascular: Normal rate, regular rhythm, normal heart sounds and intact distal pulses.   Pulmonary/Chest: Effort normal and breath sounds normal.  Abdominal: Soft. Bowel sounds are normal.  Genitourinary: Vagina normal and uterus normal.  Musculoskeletal: Normal range of motion.  Neurological: She is  alert and oriented to person, place, and time. She has normal reflexes.  Skin: Skin is warm and dry.  Psychiatric: She has a normal mood and affect. Her behavior is normal. Judgment and thought content normal.    MAU Course  Procedures (including critical care time)  Labs Reviewed - No data to display No results found.   No diagnosis found.    MDM   SVE ft/th/post/high. Fetal movement noted when pt placed on EFM secondary to belt placement. FHR pattern reactive with accels. Will d/c home

## 2015-12-12 ENCOUNTER — Encounter (HOSPITAL_COMMUNITY): Payer: Self-pay | Admitting: *Deleted

## 2015-12-12 ENCOUNTER — Telehealth (HOSPITAL_COMMUNITY): Payer: Self-pay | Admitting: *Deleted

## 2015-12-12 NOTE — Telephone Encounter (Signed)
Preadmission screen  

## 2015-12-15 ENCOUNTER — Inpatient Hospital Stay (HOSPITAL_COMMUNITY): Payer: BLUE CROSS/BLUE SHIELD | Admitting: Anesthesiology

## 2015-12-15 ENCOUNTER — Encounter (HOSPITAL_COMMUNITY): Payer: Self-pay | Admitting: Certified Nurse Midwife

## 2015-12-15 ENCOUNTER — Inpatient Hospital Stay (HOSPITAL_COMMUNITY)
Admission: AD | Admit: 2015-12-15 | Discharge: 2015-12-17 | DRG: 775 | Disposition: A | Discharge status: Home / Self Care | Payer: BLUE CROSS/BLUE SHIELD | Source: Ambulatory Visit | Attending: Obstetrics and Gynecology | Admitting: Obstetrics and Gynecology

## 2015-12-15 DIAGNOSIS — Z3A38 38 weeks gestation of pregnancy: Secondary | ICD-10-CM

## 2015-12-15 DIAGNOSIS — Z8249 Family history of ischemic heart disease and other diseases of the circulatory system: Secondary | ICD-10-CM | POA: Diagnosis not present

## 2015-12-15 DIAGNOSIS — Z833 Family history of diabetes mellitus: Secondary | ICD-10-CM | POA: Diagnosis not present

## 2015-12-15 DIAGNOSIS — O99214 Obesity complicating childbirth: Secondary | ICD-10-CM | POA: Diagnosis present

## 2015-12-15 DIAGNOSIS — Z6838 Body mass index (BMI) 38.0-38.9, adult: Secondary | ICD-10-CM

## 2015-12-15 DIAGNOSIS — Z6791 Unspecified blood type, Rh negative: Secondary | ICD-10-CM

## 2015-12-15 DIAGNOSIS — Z818 Family history of other mental and behavioral disorders: Secondary | ICD-10-CM | POA: Diagnosis not present

## 2015-12-15 DIAGNOSIS — IMO0001 Reserved for inherently not codable concepts without codable children: Secondary | ICD-10-CM

## 2015-12-15 DIAGNOSIS — Z811 Family history of alcohol abuse and dependence: Secondary | ICD-10-CM

## 2015-12-15 DIAGNOSIS — O26893 Other specified pregnancy related conditions, third trimester: Secondary | ICD-10-CM | POA: Diagnosis present

## 2015-12-15 DIAGNOSIS — Z825 Family history of asthma and other chronic lower respiratory diseases: Secondary | ICD-10-CM | POA: Diagnosis not present

## 2015-12-15 DIAGNOSIS — Z3483 Encounter for supervision of other normal pregnancy, third trimester: Secondary | ICD-10-CM

## 2015-12-15 LAB — CBC
HCT: 31.5 % — ABNORMAL LOW (ref 36.0–46.0)
Hemoglobin: 10.5 g/dL — ABNORMAL LOW (ref 12.0–15.0)
MCH: 28.4 pg (ref 26.0–34.0)
MCHC: 33.3 g/dL (ref 30.0–36.0)
MCV: 85.1 fL (ref 78.0–100.0)
PLATELETS: 333 10*3/uL (ref 150–400)
RBC: 3.7 MIL/uL — AB (ref 3.87–5.11)
RDW: 14.6 % (ref 11.5–15.5)
WBC: 11.8 10*3/uL — ABNORMAL HIGH (ref 4.0–10.5)

## 2015-12-15 MED ORDER — SOD CITRATE-CITRIC ACID 500-334 MG/5ML PO SOLN: 30.0000 mL | ORAL | Status: DC | PRN

## 2015-12-15 MED ORDER — BUTORPHANOL TARTRATE 1 MG/ML IJ SOLN
1.0000 mg | INTRAMUSCULAR | Status: DC | PRN
Start: 2015-12-15 — End: 2015-12-16
  Administered 2015-12-15 (×2): 1 mg via INTRAVENOUS
  Filled 2015-12-15 (×2): qty 1

## 2015-12-15 MED ORDER — LACTATED RINGERS IV SOLN: 500.0000 mL | INTRAVENOUS | Status: DC | PRN

## 2015-12-15 MED ORDER — OXYTOCIN 40 UNITS IN LACTATED RINGERS INFUSION - SIMPLE MED
1.0000 m[IU]/min | INTRAVENOUS | Status: DC
Start: 2015-12-15 — End: 2015-12-16
  Administered 2015-12-15: 2 m[IU]/min via INTRAVENOUS
  Filled 2015-12-15: qty 1000

## 2015-12-15 MED ORDER — LACTATED RINGERS IV SOLN
INTRAVENOUS | Status: DC
Start: 2015-12-15 — End: 2015-12-16

## 2015-12-15 MED ORDER — FLEET ENEMA 7-19 GM/118ML RE ENEM: 1.0000 | ENEMA | RECTAL | Status: DC | PRN

## 2015-12-15 MED ORDER — ACETAMINOPHEN 325 MG PO TABS: 650.0000 mg | ORAL_TABLET | ORAL | Status: DC | PRN

## 2015-12-15 MED ORDER — OXYTOCIN 40 UNITS IN LACTATED RINGERS INFUSION - SIMPLE MED: 2.5000 [IU]/h | INTRAVENOUS | Status: DC

## 2015-12-15 MED ORDER — BUTORPHANOL TARTRATE 1 MG/ML IJ SOLN: 1.0000 mg | INTRAMUSCULAR | Status: DC | PRN

## 2015-12-15 MED ORDER — LIDOCAINE HCL (PF) 1 % IJ SOLN
30.0000 mL | INTRAMUSCULAR | Status: DC | PRN
  Filled 2015-12-15: qty 30

## 2015-12-15 MED ORDER — OXYCODONE-ACETAMINOPHEN 5-325 MG PO TABS
2.0000 | ORAL_TABLET | ORAL | Status: DC | PRN
Start: 2015-12-15 — End: 2015-12-16

## 2015-12-15 MED ORDER — OXYTOCIN BOLUS FROM INFUSION: 500.0000 mL | Freq: Once | INTRAVENOUS | Status: DC

## 2015-12-15 MED ORDER — OXYCODONE-ACETAMINOPHEN 5-325 MG PO TABS: 2.0000 | ORAL_TABLET | ORAL | Status: DC | PRN

## 2015-12-15 MED ORDER — LIDOCAINE HCL (PF) 1 % IJ SOLN
INTRAMUSCULAR | Status: DC | PRN
  Administered 2015-12-15 (×2): 7 mL via EPIDURAL

## 2015-12-15 MED ORDER — LACTATED RINGERS IV SOLN: INTRAVENOUS | Status: DC

## 2015-12-15 MED ORDER — OXYCODONE-ACETAMINOPHEN 5-325 MG PO TABS
1.0000 | ORAL_TABLET | ORAL | Status: DC | PRN
Start: 2015-12-15 — End: 2015-12-16
  Filled 2015-12-15: qty 1

## 2015-12-15 MED ORDER — TERBUTALINE SULFATE 1 MG/ML IJ SOLN
0.2500 mg | Freq: Once | INTRAMUSCULAR | Status: DC | PRN
  Filled 2015-12-15: qty 1

## 2015-12-15 MED ORDER — OXYCODONE-ACETAMINOPHEN 5-325 MG PO TABS
1.0000 | ORAL_TABLET | ORAL | Status: DC | PRN
  Administered 2015-12-16: 1 via ORAL

## 2015-12-15 MED ORDER — ONDANSETRON HCL 4 MG/2ML IJ SOLN
4.0000 mg | Freq: Four times a day (QID) | INTRAMUSCULAR | Status: DC | PRN
  Administered 2015-12-15: 4 mg via INTRAVENOUS

## 2015-12-15 MED ORDER — DIPHENHYDRAMINE HCL 50 MG/ML IJ SOLN
12.5000 mg | INTRAMUSCULAR | Status: DC | PRN
  Administered 2015-12-16: 12.5 mg via INTRAVENOUS
  Filled 2015-12-15: qty 1

## 2015-12-15 MED ORDER — OXYTOCIN BOLUS FROM INFUSION
500.0000 mL | Freq: Once | INTRAVENOUS | Status: AC
  Administered 2015-12-16: 500 mL via INTRAVENOUS

## 2015-12-15 MED ORDER — ONDANSETRON HCL 4 MG/2ML IJ SOLN
4.0000 mg | Freq: Four times a day (QID) | INTRAMUSCULAR | Status: DC | PRN
  Filled 2015-12-15: qty 2

## 2015-12-15 MED ORDER — EPHEDRINE 5 MG/ML INJ
10.0000 mg | INTRAVENOUS | Status: DC | PRN
  Filled 2015-12-15: qty 4

## 2015-12-15 MED ORDER — PHENYLEPHRINE 40 MCG/ML (10ML) SYRINGE FOR IV PUSH (FOR BLOOD PRESSURE SUPPORT)
80.0000 ug | PREFILLED_SYRINGE | INTRAVENOUS | Status: DC | PRN
  Filled 2015-12-15: qty 5

## 2015-12-15 MED ORDER — FENTANYL 2.5 MCG/ML BUPIVACAINE 1/10 % EPIDURAL INFUSION (WH - ANES)
14.0000 mL/h | INTRAMUSCULAR | Status: DC | PRN
  Administered 2015-12-15 – 2015-12-16 (×2): 14 mL/h via EPIDURAL
  Filled 2015-12-15 (×2): qty 125

## 2015-12-15 MED ORDER — LACTATED RINGERS IV SOLN: 500.0000 mL | Freq: Once | INTRAVENOUS | Status: DC

## 2015-12-15 MED ORDER — PHENYLEPHRINE 40 MCG/ML (10ML) SYRINGE FOR IV PUSH (FOR BLOOD PRESSURE SUPPORT)
80.0000 ug | PREFILLED_SYRINGE | INTRAVENOUS | Status: DC | PRN
  Filled 2015-12-15: qty 5
  Filled 2015-12-15: qty 10

## 2015-12-15 NOTE — Anesthesia Preprocedure Evaluation (Signed)
Anesthesia Evaluation  Patient identified by MRN, date of birth, ID band Patient awake    Reviewed: Allergy & Precautions, H&P , NPO status , Patient's Chart, lab work & pertinent test results  Airway Mallampati: II  TM Distance: >3 FB Neck ROM: full    Dental no notable dental hx.    Pulmonary neg pulmonary ROS,    Pulmonary exam normal        Cardiovascular negative cardio ROS Normal cardiovascular exam     Neuro/Psych negative neurological ROS     GI/Hepatic negative GI ROS, Neg liver ROS,   Endo/Other  Morbid obesity  Renal/GU negative Renal ROS     Musculoskeletal   Abdominal (+) + obese,   Peds  Hematology negative hematology ROS (+)   Anesthesia Other Findings   Reproductive/Obstetrics (+) Pregnancy                             Anesthesia Physical Anesthesia Plan  ASA: III  Anesthesia Plan: Epidural   Post-op Pain Management:    Induction:   Airway Management Planned:   Additional Equipment:   Intra-op Plan:   Post-operative Plan:   Informed Consent: I have reviewed the patients History and Physical, chart, labs and discussed the procedure including the risks, benefits and alternatives for the proposed anesthesia with the patient or authorized representative who has indicated his/her understanding and acceptance.     Plan Discussed with:   Anesthesia Plan Comments:         Anesthesia Quick Evaluation

## 2015-12-15 NOTE — Progress Notes (Signed)
Patient ID: Christina [name], female   DOB: 05/04/1985, 30 y.o.   MRN: [id number]  Comfortable with epidural  AFVSS gen NAD FHTs 120-140's, good var, category 1 toco Q 2-305min  SVE 4/70/0  IUPC placed without difficulty or complication  Continue current mgmt Expect SVD

## 2015-12-15 NOTE — Anesthesia Procedure Notes (Signed)
Epidural Patient location during procedure: OB Start time: 12/15/2015 5:27 PM End time: 12/15/2015 5:31 PM  Staffing Anesthesiologist: Leilani AbleHATCHETT, Rip Hawes Performed: anesthesiologist   Preanesthetic Checklist Completed: patient identified, surgical consent, pre-op evaluation, timeout performed, IV checked, risks and benefits discussed and monitors and equipment checked  Epidural Patient position: sitting Prep: site prepped and draped and DuraPrep Patient monitoring: continuous pulse ox and blood pressure Approach: midline Location: L3-L4 Injection technique: LOR air  Needle:  Needle type: Tuohy  Needle gauge: 17 G Needle length: 9 cm and 9 Needle insertion depth: 6 cm Catheter type: closed end flexible Catheter size: 19 Gauge Catheter at skin depth: 11 cm Test dose: negative and Other  Assessment Sensory level: T9 Events: blood not aspirated, injection not painful, no injection resistance, negative IV test and no paresthesia  Additional Notes Reason for block:procedure for pain

## 2015-12-15 NOTE — Progress Notes (Signed)
Patient ID: Golden HurterChristine W Rieth, female   DOB: 07/18/1985, 30 y.o.   MRN: [id number]  Comfortable with epidural  AFVSS gen NAD FHTs 130-140, good var, category 1 toco Q 4-5 min  AROM for clear fluid, without difficulty or complication SVE 3.5/50/-2  Will augment with pitocin Expect SVD D/W pt h/o 4th degree laceration, had a normal delivery after. D/W pt salpingectomy, only wants if LTCS

## 2015-12-15 NOTE — MAU Note (Signed)
Started contracting yesterday, uncomfortable but not in pain.  This morning they have been less than 5 min apart.

## 2015-12-15 NOTE — H&P (Addendum)
Christina [name] is a 30 y.o. female G3P2002 at 13+ presenting in early labor, with ctx pain with cervical change.  Relatively uncomplicated PNC.  Pt is Rh negative - 10/04/15.  Also GBBS neg, first trimester screen WNL.  Some tachycardia in pregnancy - wore holter monitor - cardiology involved.    OB History    Gravida Para Term Preterm AB Living   _0 0 0 2   SAB TAB Ectopic Multiple Live Births   0 0 0 0 2    G1 12/2006 SVD, 4th degree laceration 8#7 G2 08/2011 SVD 7#2 G3 present  No abn pap No STD  Past Medical History:  Diagnosis Date  . Anxiety   . Eczema   . History of hip fracture 10/2010   still has pain, right hip  . Hyperemesis arising during pregnancy   . UTI (lower urinary tract infection)   . Vaginitis and vulvovaginitis   tachycardia  Past Surgical History:  Procedure Laterality Date  . LAPAROSCOPY Left 06/20/2012   Procedure: LAPAROSCOPY OPERATIVE;  Surgeon: Cheri Fowler, MD;  Location: [facility];  Service: Gynecology;  Laterality: Left;  OPEN LAPAROSCOPY, removal of portion of left ovarian cyst wall  . WISDOM TOOTH EXTRACTION     Family History: family history includes Alcohol abuse in her brother, maternal aunt, maternal uncle, and mother; Anxiety disorder in her mother; Asthma in her maternal grandfather, maternal grandmother, mother, paternal grandfather, and paternal grandmother; Cancer in her maternal aunt; Crohn's disease in her brother; Depression in her mother; Diabetes in her maternal grandfather and paternal grandfather; Endometriosis in her sister; Heart disease in her paternal grandfather; Hypertension in her father, maternal grandfather, paternal grandfather, and paternal grandmother; Hypothyroidism in her maternal aunt and mother; Mental illness in her maternal grandmother and mother; Miscarriages / [place] in her maternal aunt; Ovarian cysts in her mother and sister. Social History:  reports that she has never smoked. She has never used smokeless  tobacco. She reports that she does not drink alcohol or use drugs. Meds PNV All NKDA, Sulfa    Maternal Diabetes: No Genetic Screening: Normal Maternal Ultrasounds/Referrals: Normal Fetal Ultrasounds or other Referrals:  None Maternal Substance Abuse:  No Significant Maternal Medications:  None Significant Maternal Lab Results:  Lab values include: Group B Strep negative Other Comments:  None  Review of Systems  Constitutional: Negative.   HENT: Negative.   Eyes: Negative.   Respiratory: Negative.   Cardiovascular: Negative.   Gastrointestinal: Positive for abdominal pain.  Genitourinary: Negative.   Musculoskeletal: Positive for back pain.  Skin: Negative.   Neurological: Negative.   Psychiatric/Behavioral: Negative.    Maternal Medical History:  Reason for admission: Contractions.   Contractions: Onset was 6-12 hours ago.   Frequency: regular.   Perceived severity is moderate.    Fetal activity: Perceived fetal activity is normal.    Prenatal complications: no prenatal complications Prenatal Complications - Diabetes: none.    Dilation: 3 Effacement (%): 60 Station: -3 Exam by:: A. Jones RNC Blood pressure 126/69, pulse 109, temperature 98.2 F (36.8 C), temperature source Oral, resp. rate 18. Maternal Exam:  Uterine Assessment: Contraction strength is moderate.  Contraction frequency is regular.   Abdomen: Patient reports no abdominal tenderness. Fundal height is appropriate for gestation.   Estimated fetal weight is 7.5-8.5#.   Fetal presentation: vertex  Introitus: Normal vulva. Normal vagina.  Pelvis: adequate for delivery.   Cervix: Cervix evaluated by digital exam.     Physical Exam  Constitutional:  She is oriented to person, place, and time. She appears well-developed and well-nourished.  HENT:  Head: Normocephalic and atraumatic.  Cardiovascular: Normal rate and regular rhythm.   Respiratory: Effort normal and breath sounds normal. No  respiratory distress. She has no wheezes.  GI: Soft. Bowel sounds are normal. She exhibits no distension. There is no tenderness.  Musculoskeletal: Normal range of motion.  Neurological: She is alert and oriented to person, place, and time.  Skin: Skin is warm and dry.  Psychiatric: She has a normal mood and affect. Her behavior is normal.    Prenatal labs: ABO, Rh: --/--/A NEG (08/13 1312) Antibody: POS (08/13 1312) Rubella: Nonimmune (02/14 0000) RPR: Nonreactive (02/14 0000)  HBsAg: Negative (02/14 0000)  HIV: Non-reactive (02/14 0000)  GBS: Negative (07/27 0000)   Hgb 12.7/Plt 317/Ur Cx neg/GC neg/ Chl neg/ First trimester screen WNL  Rhogam 10/04/15  Desires PPBTL - by salpingectomy  EDC 12/25/15 by [place]  Tdap 10/04/15  Assessment/Plan: 30yo G3P2002 at 38+ presents with ctx and cervical change, early labor RNI - MMR PP GBBS no prophylaxis AROM and pitocin to augment Expect SVD D/w pt salpingectomy for PP BTL   Bovard-Stuckert, Khila Papp 12/15/2015, 4:01 PM

## 2015-12-16 ENCOUNTER — Encounter (HOSPITAL_COMMUNITY): Payer: Self-pay | Admitting: Obstetrics and Gynecology

## 2015-12-16 LAB — RPR: RPR: NONREACTIVE

## 2015-12-16 MED ORDER — OXYCODONE HCL 5 MG PO TABS
5.0000 mg | ORAL_TABLET | ORAL | Status: DC | PRN
  Administered 2015-12-16 (×2): 5 mg via ORAL
  Filled 2015-12-16 (×2): qty 1

## 2015-12-16 MED ORDER — ACETAMINOPHEN 325 MG PO TABS
650.0000 mg | ORAL_TABLET | ORAL | Status: DC | PRN
  Administered 2015-12-16: 650 mg via ORAL
  Filled 2015-12-16: qty 2

## 2015-12-16 MED ORDER — DIPHENHYDRAMINE HCL 25 MG PO CAPS: 25.0000 mg | ORAL_CAPSULE | Freq: Four times a day (QID) | ORAL | Status: DC | PRN

## 2015-12-16 MED ORDER — ONDANSETRON HCL 4 MG/2ML IJ SOLN: 4.0000 mg | INTRAMUSCULAR | Status: DC | PRN

## 2015-12-16 MED ORDER — SENNOSIDES-DOCUSATE SODIUM 8.6-50 MG PO TABS
2.0000 | ORAL_TABLET | ORAL | Status: DC
  Administered 2015-12-16: 2 via ORAL
  Filled 2015-12-16: qty 2

## 2015-12-16 MED ORDER — ZOLPIDEM TARTRATE 5 MG PO TABS: 5.0000 mg | ORAL_TABLET | Freq: Every evening | ORAL | Status: DC | PRN

## 2015-12-16 MED ORDER — ONDANSETRON HCL 4 MG PO TABS: 4.0000 mg | ORAL_TABLET | ORAL | Status: DC | PRN

## 2015-12-16 MED ORDER — OXYCODONE-ACETAMINOPHEN 5-325 MG PO TABS
1.0000 | ORAL_TABLET | ORAL | Status: DC | PRN
Start: 2015-12-16 — End: 2015-12-17
  Administered 2015-12-16 – 2015-12-17 (×4): 1 via ORAL
  Filled 2015-12-16 (×4): qty 1

## 2015-12-16 MED ORDER — LACTATED RINGERS IV SOLN: INTRAVENOUS | Status: DC

## 2015-12-16 MED ORDER — SIMETHICONE 80 MG PO CHEW: 80.0000 mg | CHEWABLE_TABLET | ORAL | Status: DC | PRN

## 2015-12-16 MED ORDER — MEASLES, MUMPS & RUBELLA VAC ~~LOC~~ INJ
0.5000 mL | INJECTION | Freq: Once | SUBCUTANEOUS | Status: AC
  Administered 2015-12-17: 0.5 mL via SUBCUTANEOUS
  Filled 2015-12-16 (×2): qty 0.5

## 2015-12-16 MED ORDER — COCONUT OIL OIL: 1.0000 "application " | TOPICAL_OIL | Status: DC | PRN

## 2015-12-16 MED ORDER — WITCH HAZEL-GLYCERIN EX PADS: 1.0000 "application " | MEDICATED_PAD | CUTANEOUS | Status: DC | PRN

## 2015-12-16 MED ORDER — OXYCODONE HCL 5 MG PO TABS: 10.0000 mg | ORAL_TABLET | ORAL | Status: DC | PRN

## 2015-12-16 MED ORDER — DIBUCAINE 1 % RE OINT: 1.0000 "application " | TOPICAL_OINTMENT | RECTAL | Status: DC | PRN

## 2015-12-16 MED ORDER — PRENATAL MULTIVITAMIN CH
1.0000 | ORAL_TABLET | Freq: Every day | ORAL | Status: DC
  Filled 2015-12-16: qty 1

## 2015-12-16 MED ORDER — IBUPROFEN 600 MG PO TABS
600.0000 mg | ORAL_TABLET | Freq: Four times a day (QID) | ORAL | Status: DC
  Administered 2015-12-16 – 2015-12-17 (×5): 600 mg via ORAL
  Filled 2015-12-16 (×5): qty 1

## 2015-12-16 MED ORDER — BENZOCAINE-MENTHOL 20-0.5 % EX AERO
1.0000 "application " | INHALATION_SPRAY | CUTANEOUS | Status: DC | PRN
  Filled 2015-12-16: qty 56

## 2015-12-16 NOTE — Progress Notes (Signed)
[facility] by RN and Husband. Transfers with steady gait to bed. Vitals WNL, color pale- pink, fundus is firm and at the umbilicus with small rubra lochia. Pain scale is 1 at this time. Oriented to room, call light, visiting hours, and paper work. Condition stable.

## 2015-12-16 NOTE — Anesthesia Postprocedure Evaluation (Signed)
Anesthesia Post Note  Patient: Christina HurterChristine W Cornwall  Procedure(s) Performed: * No procedures listed *  Patient location during evaluation: Mother Baby Anesthesia Type: Epidural Level of consciousness: awake and alert Pain management: pain level controlled Vital Signs Assessment: post-procedure vital signs reviewed and stable Respiratory status: spontaneous breathing, nonlabored ventilation and respiratory function stable Cardiovascular status: stable Postop Assessment: no headache, no backache and epidural receding Anesthetic complications: no     Last Vitals:  Vitals:   12/16/15 0320 12/16/15 0420  BP: (!) 99/53 (!) 104/45  Pulse: 94 88  Resp: 18 18  Temp: 36.9 C 37 C    Last Pain:  Vitals:   12/16/15 0420  TempSrc: Oral  PainSc: 0-No pain   Pain Goal: Patients Stated Pain Goal: 0 (12/16/15 0420)               Junious SilkGILBERT,Tilia Faso

## 2015-12-16 NOTE — Progress Notes (Signed)
PPD #0 No problems Afeb, VSS Fundus firm, NT at U-1 Continue routine postpartum care, will see if can do circumcision later today

## 2015-12-16 NOTE — Lactation Note (Signed)
This note was copied from a baby's chart. Lactation Consultation Note Experienced BF Mother reports that BF is going well. She denies any soreness and states baby is eating about every 2 hours.  Hand expression explain verbally but mom declined to try and express colostrum. Asked mother to call for latch assessment every 8 hours.  Information given on support groups and outpatient services. Patient Name: Christina Sundra AlandChristine [name] GNFAO'ZToday's Date: 12/16/2015 Reason for consult: Initial assessment   Maternal Data Has patient been taught Hand Expression?: Yes Does the patient have breastfeeding experience prior to this delivery?: Yes  Feeding Feeding Type: Breast Fed Length of feed: 5 min  LATCH Score/Interventions                      Lactation Tools Discussed/Used     Consult Status Consult Status: PRN    Soyla DryerJoseph, Mallorey Odonell 12/16/2015, 2:08 PM

## 2015-12-16 NOTE — Progress Notes (Signed)
Patient assisted out of bed to bathroom to void. Instructed in peri care then back to bed. Patient had steady gait with no dizziness. Condition stable.

## 2015-12-17 LAB — CBC
HEMATOCRIT: 28.8 % — AB (ref 36.0–46.0)
Hemoglobin: 9.4 g/dL — ABNORMAL LOW (ref 12.0–15.0)
MCH: 27.7 pg (ref 26.0–34.0)
MCHC: 32.6 g/dL (ref 30.0–36.0)
MCV: 85 fL (ref 78.0–100.0)
PLATELETS: 275 10*3/uL (ref 150–400)
RBC: 3.39 MIL/uL — ABNORMAL LOW (ref 3.87–5.11)
RDW: 15.1 % (ref 11.5–15.5)
WBC: 12 10*3/uL — AB (ref 4.0–10.5)

## 2015-12-17 MED ORDER — OXYCODONE-ACETAMINOPHEN 5-325 MG PO TABS: 1.0000 | ORAL_TABLET | ORAL | 0 refills | Status: DC | PRN

## 2015-12-17 MED ORDER — IBUPROFEN 600 MG PO TABS: 600.0000 mg | ORAL_TABLET | Freq: Four times a day (QID) | ORAL | 1 refills | Status: DC | PRN

## 2015-12-17 NOTE — Progress Notes (Signed)
MOB was referred for history of anxiety.  Referral is screened out by Clinical Social Worker because none of the following criteria appear to apply and  there are no reports impacting the pregnancy or her transition to the postpartum period. CSW does not deem it clinically necessary to further investigate at this time.  -History of anxiety/depression during this pregnancy, or of post-partum depression.  - Diagnosis of anxiety and/or depression within last 3 years.-  - History of depression due to pregnancy loss/loss of child or -MOB's symptoms are currently being treated with medication and/or therapy.  (MOB dx as a child per OB records, was on medication prior to pregnancy Xanax and Adderal per notes. Medications stopped with pregnancy and MOB aware of services when ready to resume. No documentation indicating anxiety/stressors during pregnancy.)  Please contact the Clinical Social Worker if needs arise or upon MOB request.     Deretha EmoryHannah Neidra Girvan LCSW, MSW Clinical Social Work: System Insurance underwriterWide Float Coverage for [employer] N[facility] Clinical social worker [phone]

## 2015-12-17 NOTE — Discharge Instructions (Signed)
Nothing in vagina for 6 weeks.  No sex, tampons, and douching.  Other instructions as in [facility] Discharge Booklet. °

## 2015-12-17 NOTE — Progress Notes (Addendum)
Patient ID: Christina [name], female   DOB: 03/27/1986, 30 y.o.   MRN: [id number] Pt doing well. Reports back pain - not at epidural site, no fever or chills or headaches. No CP or SOB. Able to ambulate with no issue but has not been doing so a thought better to rest as much as possible. Bonding well with baby - breastfeeding. Ready for discharge to home today. Has multiple questions about postpartum expectations.  VSS ABD_ soft, FF 3-4cm below umbilicus EXT - no pitting edema, no Homans  Hg 9.4  A/P: G3P3003 on PPD # 2 s/p svd - recovering well         Pain controlled with ibuprofen and percocet         Plans for pp BTL - surgery to be planned with cardiology          Baby to be circumcised before discharge         All questions answered and pp instructions given

## 2015-12-17 NOTE — Discharge Summary (Signed)
OB Discharge Summary     Patient Name: Christina [name] DOB: 04/13/1986 MRN: [id number]  Date of admission: 12/15/2015 Delivering MD: Sherian ReinBOVARD-STUCKERT, JODY   Date of discharge: 12/17/2015  Admitting diagnosis: 38 WEEKS HAVING CONTRACTIONS 3-5 APART Intrauterine pregnancy: 3153w5d     Secondary diagnosis:  Principal Problem:   SVD (spontaneous vaginal delivery) Active Problems:   Active labor at term   Normal pregnancy in multigravida in third trimester  Additional problems: none     Discharge diagnosis: Term Pregnancy Delivered                                                                                                Post partum procedures:none  Augmentation: AROM and Pitocin  Complications: None  Hospital course:  Onset of Labor With Vaginal Delivery     30 y.o. yo G3P3003 at 7453w5d was admitted in Active Labor on 12/15/2015. Patient had an uncomplicated labor course as follows:  Membrane Rupture Time/Date: 6:13 PM ,12/15/2015   Intrapartum Procedures: Episiotomy: None [1]                                         Lacerations:  2nd degree [3]  Patient had a delivery of a Viable infant. 12/16/2015  Information for the patient's newborn:  Cresenciano LickBush, Boy Monic [[id number]][[id number]]  Delivery Method: Vaginal, Spontaneous Delivery (Filed from Delivery Summary)    Pateint had an uncomplicated postpartum course.  She is ambulating, tolerating a regular diet, passing flatus, and urinating well. Patient is discharged home in stable condition on 12/17/15.    Physical exam Vitals:   12/16/15 0420 12/16/15 1613 12/16/15 1758 12/17/15 0500  BP: (!) 104/45 (!) 123/53 117/60 (!) 105/58  Pulse: 88 72 81 73  Resp: 18 18 18 18   Temp: 98.6 F (37 C) 97.9 F (36.6 C) 98.1 F (36.7 C) 97.9 F (36.6 C)  TempSrc: Oral Oral Oral   SpO2: 98%  98%   Weight:      Height:       General: alert, cooperative and no distress Lochia: appropriate Uterine Fundus: firm Incision: N/A DVT Evaluation: No  evidence of DVT seen on physical exam. No significant calf/ankle edema. Labs: Lab Results  Component Value Date   WBC 12.0 (H) 12/17/2015   HGB 9.4 (L) 12/17/2015   HCT 28.8 (L) 12/17/2015   MCV 85.0 12/17/2015   PLT 275 12/17/2015   CMP Latest Ref Rng & Units 08/20/2015  Glucose 65 - 99 mg/dL 99  BUN 6 - 20 mg/dL 6  Creatinine 7.820.44 - 9.561.00 mg/dL 2.130.46  Sodium 086135 - 578145 mmol/L 140  Potassium 3.5 - 5.1 mmol/L 4.0  Chloride 101 - 111 mmol/L 106  CO2 22 - 32 mmol/L 23  Calcium 8.9 - 10.3 mg/dL 9.2  Total Protein 6.5 - 8.1 g/dL -  Total Bilirubin 0.3 - 1.2 mg/dL -  Alkaline Phos 38 - 469126 U/L -  AST 15 - 41 U/L -  ALT 14 - 54 U/L -  Discharge instruction: per After Visit Summary and "Baby and Me Booklet".  After visit meds:    Medication List    TAKE these medications   bisacodyl 5 MG EC tablet Commonly known as:  DULCOLAX Take 5 mg by mouth at bedtime.   ibuprofen 600 MG tablet Commonly known as:  ADVIL,MOTRIN Take 1 tablet (600 mg total) by mouth every 6 (six) hours as needed.   oxyCODONE-acetaminophen 5-325 MG tablet Commonly known as:  PERCOCET/ROXICET Take 1 tablet by mouth every 4 (four) hours as needed for severe pain.   prenatal multivitamin Tabs tablet Take 1 tablet by mouth at bedtime.       Diet: low salt diet  Activity: Advance as tolerated. Pelvic rest for 6 weeks.   Outpatient follow up:4-5weeks Follow up Appt:Future Appointments Date Time Provider Department Center  12/20/2015 7:00 AM [facility] None   Follow up Visit:No Follow-up on file.  Postpartum contraception: Tubal Ligation  Newborn Data: Live born female  Birth Weight: 9 lb 5 oz (4224 g) APGAR: 9, 10  Baby Feeding: Breast Disposition:home with mother   12/17/2015 Edwinna Areolaecilia Worema Banga, DO

## 2015-12-18 LAB — TYPE AND SCREEN
ABO/RH(D): A NEG
ANTIBODY SCREEN: POSITIVE
DAT, IgG: NEGATIVE
Unit division: 0
Unit division: 0

## 2015-12-20 ENCOUNTER — Inpatient Hospital Stay (HOSPITAL_COMMUNITY): Admission: RE | Admit: 2015-12-20 | Payer: BLUE CROSS/BLUE SHIELD | Source: Ambulatory Visit

## 2016-01-17 ENCOUNTER — Encounter: Payer: Self-pay | Admitting: Internal Medicine

## 2016-01-20 ENCOUNTER — Ambulatory Visit (INDEPENDENT_AMBULATORY_CARE_PROVIDER_SITE_OTHER): Payer: BLUE CROSS/BLUE SHIELD | Admitting: Internal Medicine

## 2016-01-20 ENCOUNTER — Encounter (INDEPENDENT_AMBULATORY_CARE_PROVIDER_SITE_OTHER): Payer: Self-pay

## 2016-01-20 ENCOUNTER — Encounter: Payer: Self-pay | Admitting: Internal Medicine

## 2016-01-20 VITALS — BP 116/78 | HR 107 | Ht 67.0 in | Wt 225.8 lb

## 2016-01-20 DIAGNOSIS — R000 Tachycardia, unspecified: Secondary | ICD-10-CM | POA: Diagnosis not present

## 2016-01-20 MED ORDER — METOPROLOL TARTRATE 25 MG PO TABS: 12.5000 mg | ORAL_TABLET | Freq: Two times a day (BID) | ORAL | 3 refills | Status: DC

## 2016-01-20 NOTE — Progress Notes (Signed)
HPI Christina [name] is referred today by Dr. Tenny Crawoss for evaluation of SVT. She is a pleasant 30 yo woman with a confusing past medical history. She has been told she has problems with anxiety for years. She has had multiple ER visits and with her pregnancies, has had worsening palpitations. She has no documented SVT in our ECG system although the patient is convinced she has had SVT at 200/min. These episodes can be terminated with vagal maneuvers but she has has never required adenosine. She has just given birth to her 3rd child and has had only a few episode of palpitations. When her heart races, she feels sob and like her heart is about to beat out of her chest.  Allergies  Allergen Reactions  . Sulfa Antibiotics Hives, Itching and Swelling  . Latex Itching     Current Outpatient Prescriptions  Medication Sig Dispense Refill  . Prenatal Vit-Fe Fumarate-FA (PRENATAL MULTIVITAMIN) TABS tablet Take 1 tablet by mouth at bedtime.     . metoprolol tartrate (LOPRESSOR) 25 MG tablet Take 0.5 tablets (12.5 mg total) by mouth 2 (two) times daily. 30 tablet 3   No current facility-administered medications for this visit.      Past Medical History:  Diagnosis Date  . Anxiety   . Eczema   . History of hip fracture 10/2010   still has pain, right hip  . Hyperemesis arising during pregnancy   . SVD (spontaneous vaginal delivery) 12/16/2015  . UTI (lower urinary tract infection)   . Vaginitis and vulvovaginitis     ROS:   All systems reviewed and negative except as noted in the HPI.   Past Surgical History:  Procedure Laterality Date  . LAPAROSCOPY Left 06/20/2012   Procedure: LAPAROSCOPY OPERATIVE;  Surgeon: Lavina Hammanodd Meisinger, MD;  Location: [facility];  Service: Gynecology;  Laterality: Left;  OPEN LAPAROSCOPY, removal of portion of left ovarian cyst wall  . WISDOM TOOTH EXTRACTION       Family History  Problem Relation Age of Onset  . Alcohol abuse Mother   . Anxiety disorder Mother   .  Asthma Mother   . Mental illness Mother     bipolar  . Depression Mother   . Hypothyroidism Mother   . Ovarian cysts Mother   . Hypertension Father   . Endometriosis Sister   . Ovarian cysts Sister   . Alcohol abuse Brother   . Crohn's disease Brother   . Alcohol abuse Maternal Aunt   . Cancer Maternal Aunt     breast  . Hypothyroidism Maternal Aunt   . Miscarriages / Stillbirths Maternal Aunt     stillbirth  . Alcohol abuse Maternal Uncle   . Asthma Maternal Grandmother   . Mental illness Maternal Grandmother     bipolar  . Asthma Maternal Grandfather   . Diabetes Maternal Grandfather   . Hypertension Maternal Grandfather   . Asthma Paternal Grandmother   . Hypertension Paternal Grandmother   . Asthma Paternal Grandfather   . Diabetes Paternal Grandfather   . Heart disease Paternal Grandfather   . Hypertension Paternal Grandfather      Social History   Social History  . Marital status: Married    Spouse name: N/A  . Number of children: N/A  . Years of education: N/A   Occupational History  . Not on file.   Social History Main Topics  . Smoking status: Never Smoker  . Smokeless tobacco: Never Used  . Alcohol use No  .  Drug use: No  . Sexual activity: Yes    Birth control/ protection: None   Other Topics Concern  . Not on file   Social History Narrative  . No narrative on file     BP 116/78   Pulse (!) 107   Ht 5\' 7"  (1.702 m)   Wt 225 lb 12.8 oz (102.4 kg)   SpO2 99%   BMI 35.37 kg/m   Physical Exam:  Well appearing 30 yo woman, NAD HEENT: Unremarkable Neck:  6 cm JVD, no thyromegally Lymphatics:  No adenopathy Back:  No CVA tenderness Lungs:  Clear with no wheezes HEART:  Regular rate rhythm, no murmurs, no rubs, no clicks Abd:  soft, positive bowel sounds, no organomegally, no rebound, no guarding Ext:  2 plus pulses, no edema, no cyanosis, no clubbing Skin:  No rashes no nodules Neuro:  CN II through XII intact, motor grossly  intact  EKG - nsr  Assess/Plan: 1. Palpitations - I suspect she has both SVT and sinus tachycardia. We discussed the underlying etiology of both. She will start low dose metoprolol.  2. SVT - cannot find any definitive evidence of SVT. She will undergo watchful waiting. She will start toprol. 3. Anxiety - she has been reassured. She has a lot of stress with 3 small children.   Leonia Reeves.D.

## 2016-01-20 NOTE — Patient Instructions (Addendum)
Medication Instructions:    Your physician has recommended you make the following change in your medication:   1) START Metoprolol tartrate 12.5 mg twice a day.  --- If you need a refill on your cardiac medications before your next appointment, please call your pharmacy. ---  Labwork:  None ordered  Testing/Procedures:  None ordered  Follow-Up:  Your physician recommends that you schedule a follow-up appointment in: 2 months with Dr. Ladona Ridgelaylor.  Thank you for choosing [facility]!!     Any Other Special Instructions Will Be Listed Below (If Applicable).  Metoprolol tablets What is this medicine? METOPROLOL (me TOE proe lole) is a beta-blocker. Beta-blockers reduce the workload on the heart and help it to beat more regularly. This medicine is used to treat high blood pressure and to prevent chest pain. It is also used to after a heart attack and to prevent an additional heart attack from occurring. This medicine may be used for other purposes; ask your health care provider or pharmacist if you have questions. What should I tell my health care provider before I take this medicine? They need to know if you have any of these conditions: -diabetes -heart or vessel disease like slow heart rate, worsening heart failure, heart block, sick sinus syndrome or Raynaud's disease -kidney disease -liver disease -lung or breathing disease, like asthma or emphysema -pheochromocytoma -thyroid disease -an unusual or allergic reaction to metoprolol, other beta-blockers, medicines, foods, dyes, or preservatives -pregnant or trying to get pregnant -breast-feeding How should I use this medicine? Take this medicine by mouth with a drink of water. Follow the directions on the prescription label. Take this medicine immediately after meals. Take your doses at regular intervals. Do not take more medicine than directed. Do not stop taking this medicine suddenly. This could lead to serious heart-related  effects. Talk to your pediatrician regarding the use of this medicine in children. Special care may be needed. Overdosage: If you think you have taken too much of this medicine contact a poison control center or emergency room at once. NOTE: This medicine is only for you. Do not share this medicine with others. What if I miss a dose? If you miss a dose, take it as soon as you can. If it is almost time for your next dose, take only that dose. Do not take double or extra doses. What may interact with this medicine? This medicine may interact with the following medications: -certain medicines for blood pressure, heart disease, irregular heart beat -certain medicines for depression like monoamine oxidase (MAO) inhibitors, fluoxetine, or paroxetine -clonidine -dobutamine -epinephrine -isoproterenol -reserpine This list may not describe all possible interactions. Give your health care provider a list of all the medicines, herbs, non-prescription drugs, or dietary supplements you use. Also tell them if you smoke, drink alcohol, or use illegal drugs. Some items may interact with your medicine. What should I watch for while using this medicine? Visit your doctor or health care professional for regular check ups. Contact your doctor right away if your symptoms worsen. Check your blood pressure and pulse rate regularly. Ask your health care professional what your blood pressure and pulse rate should be, and when you should contact them. You may get drowsy or dizzy. Do not drive, use machinery, or do anything that needs mental alertness until you know how this medicine affects you. Do not sit or stand up quickly, especially if you are an older patient. This reduces the risk of dizzy or fainting spells. Contact your  doctor if these symptoms continue. Alcohol may interfere with the effect of this medicine. Avoid alcoholic drinks. What side effects may I notice from receiving this medicine? Side effects that  you should report to your doctor or health care professional as soon as possible: -allergic reactions like skin rash, itching or hives -cold or numb hands or feet -depression -difficulty breathing -faint -fever with sore throat -irregular heartbeat, chest pain -rapid weight gain -swollen legs or ankles Side effects that usually do not require medical attention (report to your doctor or health care professional if they continue or are bothersome): -anxiety or nervousness -change in sex drive or performance -dry skin -headache -nightmares or trouble sleeping -short term memory loss -stomach upset or diarrhea -unusually tired This list may not describe all possible side effects. Call your doctor for medical advice about side effects. You may report side effects to FDA at 1-800-FDA-1088. Where should I keep my medicine? Keep out of the reach of children. Store at room temperature between 15 and 30 degrees C (59 and 86 degrees F). Throw away any unused medicine after the expiration date. NOTE: This sheet is a summary. It may not cover all possible information. If you have questions about this medicine, talk to your doctor, pharmacist, or health care provider.    2016, Elsevier/Gold Standard. (2012-12-23 14:40:36)

## 2016-02-17 NOTE — Patient Instructions (Addendum)
Your procedure is scheduled on:  Friday, Oct. 27, 2017  [facility]:  6:00 AM  Pick up the phone at the desk and dial [phone].  Call this number if you have problems the morning of surgery: [phone].  Remember: Do NOT eat food or drink after:  Midnight Thursday  Take these medicines the morning of surgery with a SIP OF WATER:  Ritalin, Xanax  Stop ALL herbal medications at this time   Do NOT wear jewelry (body piercing), metal hair clips/bobby pins, make-up, or nail polish. Do NOT wear lotions, powders, or perfumes.  You may wear deodorant. Do NOT shave for 48 hours prior to surgery. Do NOT bring valuables to the hospital. Contacts, dentures, or bridgework may not be worn into surgery.  Have a responsible adult drive you home and stay with you for 24 hours after your procedure

## 2016-02-19 ENCOUNTER — Encounter (HOSPITAL_COMMUNITY)
Admission: RE | Admit: 2016-02-19 | Discharge: 2016-02-19 | Disposition: A | Discharge status: Home / Self Care | Payer: BLUE CROSS/BLUE SHIELD | Source: Ambulatory Visit | Attending: Obstetrics and Gynecology | Admitting: Obstetrics and Gynecology

## 2016-02-19 ENCOUNTER — Encounter (HOSPITAL_COMMUNITY): Payer: Self-pay

## 2016-02-19 DIAGNOSIS — Z01818 Encounter for other preprocedural examination: Secondary | ICD-10-CM | POA: Insufficient documentation

## 2016-02-19 HISTORY — DX: Depression, unspecified: F32.A

## 2016-02-19 HISTORY — DX: Supraventricular tachycardia: I47.1

## 2016-02-19 HISTORY — DX: Supraventricular tachycardia, unspecified: I47.10

## 2016-02-19 HISTORY — DX: Other fatigue: R53.83

## 2016-02-19 HISTORY — DX: Tachycardia, unspecified: R00.0

## 2016-02-19 HISTORY — DX: Major depressive disorder, single episode, unspecified: F32.9

## 2016-02-19 HISTORY — DX: Anemia, unspecified: D64.9

## 2016-02-19 LAB — CBC
HEMATOCRIT: 35.3 % — AB (ref 36.0–46.0)
HEMOGLOBIN: 11.6 g/dL — AB (ref 12.0–15.0)
MCH: 28.4 pg (ref 26.0–34.0)
MCHC: 32.9 g/dL (ref 30.0–36.0)
MCV: 86.3 fL (ref 78.0–100.0)
Platelets: 274 10*3/uL (ref 150–400)
RBC: 4.09 MIL/uL (ref 3.87–5.11)
RDW: 15.1 % (ref 11.5–15.5)
WBC: 8.1 10*3/uL (ref 4.0–10.5)

## 2016-02-19 LAB — BASIC METABOLIC PANEL
ANION GAP: 4 — AB (ref 5–15)
BUN: 15 mg/dL (ref 6–20)
CALCIUM: 9.3 mg/dL (ref 8.9–10.3)
CHLORIDE: 107 mmol/L (ref 101–111)
CO2: 28 mmol/L (ref 22–32)
Creatinine, Ser: 0.68 mg/dL (ref 0.44–1.00)
GFR calc non Af Amer: >60 mL/min (ref >60)
Glucose, Bld: 101 mg/dL — ABNORMAL HIGH (ref 65–99)
POTASSIUM: 4.2 mmol/L (ref 3.5–5.1)
Sodium: 139 mmol/L (ref 135–145)

## 2016-02-27 NOTE — H&P (Signed)
Christina [name] is an 30 y.o. female. She is s/p SVD 8-14, desires permanent sterility.  Pertinent Gynecological History: Last pap: normal Date: 2015 OB History: G3, P3003 SVD x 3   Menstrual History: Patient's last menstrual period was 01/13/2016 (exact date).    Past Medical History:  Diagnosis Date  . Anemia    borderline anemic  . Anxiety   . Depression   . Eczema   . GERD (gastroesophageal reflux disease)    with pregnancy  . History of hip fracture 10/2010   still has pain, right hip  . Hyperemesis arising during pregnancy   . Increased heart rate    resting heart rate 100-120  . Low energy    on bedrest during pregnancy trying to regain strength  . SVD (spontaneous vaginal delivery) 12/16/2015  . SVT (supraventricular tachycardia) (HCC)   . UTI (lower urinary tract infection)   . Vaginitis and vulvovaginitis     Past Surgical History:  Procedure Laterality Date  . LAPAROSCOPY Left 06/20/2012   Procedure: LAPAROSCOPY OPERATIVE;  Surgeon: Lavina Hammanodd Kong Packett, MD;  Location: [facility];  Service: Gynecology;  Laterality: Left;  OPEN LAPAROSCOPY, removal of portion of left ovarian cyst wall  . WISDOM TOOTH EXTRACTION      Family History  Problem Relation Age of Onset  . Alcohol abuse Mother   . Anxiety disorder Mother   . Asthma Mother   . Mental illness Mother     bipolar  . Depression Mother   . Hypothyroidism Mother   . Ovarian cysts Mother   . Hypertension Father   . Endometriosis Sister   . Ovarian cysts Sister   . Alcohol abuse Brother   . Crohn's disease Brother   . Alcohol abuse Maternal Aunt   . Cancer Maternal Aunt     breast  . Hypothyroidism Maternal Aunt   . Miscarriages / Stillbirths Maternal Aunt     stillbirth  . Alcohol abuse Maternal Uncle   . Asthma Maternal Grandmother   . Mental illness Maternal Grandmother     bipolar  . Asthma Maternal Grandfather   . Diabetes Maternal Grandfather   . Hypertension Maternal Grandfather   . Asthma  Paternal Grandmother   . Hypertension Paternal Grandmother   . Asthma Paternal Grandfather   . Diabetes Paternal Grandfather   . Heart disease Paternal Grandfather   . Hypertension Paternal Grandfather     Social History:  reports that she has never smoked. She has never used smokeless tobacco. She reports that she drinks alcohol. She reports that she does not use drugs.  Allergies:  Allergies  Allergen Reactions  . Sulfa Antibiotics Hives, Itching and Swelling  . Latex Itching    No prescriptions prior to admission.    Review of Systems  Respiratory: Negative.   Cardiovascular: Negative.     Last menstrual period 01/13/2016, not currently breastfeeding. Physical Exam  Constitutional: She appears well-developed and well-nourished.  Cardiovascular: Normal rate, regular rhythm and normal heart sounds.   No murmur heard. Respiratory: Effort normal and breath sounds normal. No respiratory distress. She has no wheezes.  GI: Soft. She exhibits no distension and no mass. There is no tenderness.  Genitourinary: Vagina normal and uterus normal.  Genitourinary Comments: No adnexal mass    No results found for this or any previous visit (from the past 24 hour(s)).  No results found.  Assessment/Plan: Desires permanent sterility.  Discussed all options for contraception, she wants permanent sterility.  Discussed surgical procedure, risks, failure  rate.  Will admit for laparoscopic bilateral salpingectomy.  Lancelot Alyea D 02/27/2016, 9:22 PM

## 2016-02-27 NOTE — Anesthesia Preprocedure Evaluation (Addendum)
Anesthesia Evaluation  Patient identified by MRN, date of birth, ID band Patient awake    Reviewed: Allergy & Precautions, H&P , NPO status , Patient's Chart, lab work & pertinent test results  Airway Mallampati: II  TM Distance: >3 FB Neck ROM: full    Dental no notable dental hx. (+) Teeth Intact   Pulmonary neg pulmonary ROS,    Pulmonary exam normal        Cardiovascular negative cardio ROS Normal cardiovascular exam     Neuro/Psych negative neurological ROS     GI/Hepatic Neg liver ROS,   Endo/Other    Renal/GU negative Renal ROS     Musculoskeletal   Abdominal (+) + obese,   Peds  Hematology   Anesthesia Other Findings   Reproductive/Obstetrics negative OB ROS                             Anesthesia Physical Anesthesia Plan  ASA: III  Anesthesia Plan: General   Post-op Pain Management:    Induction: Intravenous  Airway Management Planned: Oral ETT  Additional Equipment:   Intra-op Plan:   Post-operative Plan:   Informed Consent: I have reviewed the patients History and Physical, chart, labs and discussed the procedure including the risks, benefits and alternatives for the proposed anesthesia with the patient or authorized representative who has indicated his/her understanding and acceptance.   Dental Advisory Given  Plan Discussed with: CRNA and Surgeon  Anesthesia Plan Comments:        Anesthesia Quick Evaluation

## 2016-02-28 ENCOUNTER — Ambulatory Visit (HOSPITAL_COMMUNITY): Payer: BLUE CROSS/BLUE SHIELD | Admitting: Anesthesiology

## 2016-02-28 ENCOUNTER — Encounter (HOSPITAL_COMMUNITY): Payer: Self-pay

## 2016-02-28 ENCOUNTER — Ambulatory Visit (HOSPITAL_COMMUNITY)
Admission: RE | Admit: 2016-02-28 | Discharge: 2016-02-28 | Disposition: A | Discharge status: Home / Self Care | Payer: BLUE CROSS/BLUE SHIELD | Source: Ambulatory Visit | Attending: Obstetrics and Gynecology | Admitting: Obstetrics and Gynecology

## 2016-02-28 ENCOUNTER — Encounter (HOSPITAL_COMMUNITY)
Admission: RE | Disposition: A | Discharge status: Home / Self Care | Payer: Self-pay | Source: Ambulatory Visit | Attending: Obstetrics and Gynecology

## 2016-02-28 DIAGNOSIS — Z811 Family history of alcohol abuse and dependence: Secondary | ICD-10-CM | POA: Diagnosis not present

## 2016-02-28 DIAGNOSIS — F419 Anxiety disorder, unspecified: Secondary | ICD-10-CM | POA: Insufficient documentation

## 2016-02-28 DIAGNOSIS — Z825 Family history of asthma and other chronic lower respiratory diseases: Secondary | ICD-10-CM | POA: Insufficient documentation

## 2016-02-28 DIAGNOSIS — E669 Obesity, unspecified: Secondary | ICD-10-CM | POA: Diagnosis not present

## 2016-02-28 DIAGNOSIS — Z8249 Family history of ischemic heart disease and other diseases of the circulatory system: Secondary | ICD-10-CM | POA: Insufficient documentation

## 2016-02-28 DIAGNOSIS — Z6836 Body mass index (BMI) 36.0-36.9, adult: Secondary | ICD-10-CM | POA: Diagnosis not present

## 2016-02-28 DIAGNOSIS — F329 Major depressive disorder, single episode, unspecified: Secondary | ICD-10-CM | POA: Insufficient documentation

## 2016-02-28 DIAGNOSIS — Z833 Family history of diabetes mellitus: Secondary | ICD-10-CM | POA: Insufficient documentation

## 2016-02-28 DIAGNOSIS — I471 Supraventricular tachycardia: Secondary | ICD-10-CM | POA: Insufficient documentation

## 2016-02-28 DIAGNOSIS — Z91040 Latex allergy status: Secondary | ICD-10-CM | POA: Diagnosis not present

## 2016-02-28 DIAGNOSIS — Z882 Allergy status to sulfonamides status: Secondary | ICD-10-CM | POA: Diagnosis not present

## 2016-02-28 DIAGNOSIS — N83202 Unspecified ovarian cyst, left side: Secondary | ICD-10-CM | POA: Diagnosis not present

## 2016-02-28 DIAGNOSIS — Z803 Family history of malignant neoplasm of breast: Secondary | ICD-10-CM | POA: Insufficient documentation

## 2016-02-28 DIAGNOSIS — Z302 Encounter for sterilization: Secondary | ICD-10-CM | POA: Diagnosis not present

## 2016-02-28 DIAGNOSIS — Z818 Family history of other mental and behavioral disorders: Secondary | ICD-10-CM | POA: Insufficient documentation

## 2016-02-28 HISTORY — PX: LAPAROSCOPIC TUBAL LIGATION: SHX1937

## 2016-02-28 LAB — PREGNANCY, URINE: PREG TEST UR: NEGATIVE

## 2016-02-28 SURGERY — LIGATION, FALLOPIAN TUBE, LAPAROSCOPIC
Anesthesia: General | Laterality: Bilateral

## 2016-02-28 MED ORDER — DIPHENHYDRAMINE HCL 50 MG/ML IJ SOLN
INTRAMUSCULAR | Status: DC | PRN
  Administered 2016-02-28: 6 mg via INTRAVENOUS

## 2016-02-28 MED ORDER — FENTANYL CITRATE (PF) 250 MCG/5ML IJ SOLN
INTRAMUSCULAR | Status: DC | PRN
  Administered 2016-02-28 (×5): 50 ug via INTRAVENOUS

## 2016-02-28 MED ORDER — LIDOCAINE HCL (CARDIAC) 20 MG/ML IV SOLN
INTRAVENOUS | Status: AC
  Filled 2016-02-28: qty 5

## 2016-02-28 MED ORDER — DEXAMETHASONE SODIUM PHOSPHATE 10 MG/ML IJ SOLN
INTRAMUSCULAR | Status: AC
  Filled 2016-02-28: qty 1

## 2016-02-28 MED ORDER — NEOSTIGMINE METHYLSULFATE 10 MG/10ML IV SOLN
INTRAVENOUS | Status: AC
  Filled 2016-02-28: qty 1

## 2016-02-28 MED ORDER — SODIUM CHLORIDE 0.9 % IJ SOLN
INTRAMUSCULAR | Status: DC | PRN
  Administered 2016-02-28: 10 mL

## 2016-02-28 MED ORDER — ONDANSETRON HCL 4 MG/2ML IJ SOLN
INTRAMUSCULAR | Status: DC | PRN
  Administered 2016-02-28: 4 mg via INTRAVENOUS

## 2016-02-28 MED ORDER — SCOPOLAMINE 1 MG/3DAYS TD PT72
MEDICATED_PATCH | TRANSDERMAL | Status: AC
  Administered 2016-02-28: 1.5 mg via TRANSDERMAL
  Filled 2016-02-28: qty 1

## 2016-02-28 MED ORDER — PROPOFOL 10 MG/ML IV BOLUS
INTRAVENOUS | Status: DC | PRN
  Administered 2016-02-28: 200 mg via INTRAVENOUS

## 2016-02-28 MED ORDER — FENTANYL CITRATE (PF) 100 MCG/2ML IJ SOLN
INTRAMUSCULAR | Status: DC
Start: 2016-02-28 — End: 2016-02-28
  Filled 2016-02-28: qty 2

## 2016-02-28 MED ORDER — GLYCOPYRROLATE 0.2 MG/ML IJ SOLN
INTRAMUSCULAR | Status: DC | PRN
  Administered 2016-02-28: 0.1 mg via INTRAVENOUS

## 2016-02-28 MED ORDER — FENTANYL CITRATE (PF) 250 MCG/5ML IJ SOLN
INTRAMUSCULAR | Status: AC
  Filled 2016-02-28: qty 5

## 2016-02-28 MED ORDER — FENTANYL CITRATE (PF) 100 MCG/2ML IJ SOLN: 25.0000 ug | INTRAMUSCULAR | Status: DC | PRN

## 2016-02-28 MED ORDER — KETOROLAC TROMETHAMINE 30 MG/ML IJ SOLN
INTRAMUSCULAR | Status: DC | PRN
  Administered 2016-02-28: 30 mg via INTRAVENOUS

## 2016-02-28 MED ORDER — HYDROMORPHONE HCL 1 MG/ML IJ SOLN
INTRAMUSCULAR | Status: AC
  Filled 2016-02-28: qty 1

## 2016-02-28 MED ORDER — MIDAZOLAM HCL 2 MG/2ML IJ SOLN
INTRAMUSCULAR | Status: AC
Start: 2016-02-28 — End: 2016-02-28
  Filled 2016-02-28: qty 2

## 2016-02-28 MED ORDER — LACTATED RINGERS IV SOLN
INTRAVENOUS | Status: DC
  Administered 2016-02-28: 125 mL/h via INTRAVENOUS
  Administered 2016-02-28: 08:00:00 via INTRAVENOUS

## 2016-02-28 MED ORDER — ROCURONIUM BROMIDE 100 MG/10ML IV SOLN
INTRAVENOUS | Status: DC | PRN
  Administered 2016-02-28: 10 mg via INTRAVENOUS
  Administered 2016-02-28: 30 mg via INTRAVENOUS

## 2016-02-28 MED ORDER — SCOPOLAMINE 1 MG/3DAYS TD PT72
1.0000 | MEDICATED_PATCH | Freq: Once | TRANSDERMAL | Status: DC
  Administered 2016-02-28: 1.5 mg via TRANSDERMAL

## 2016-02-28 MED ORDER — MIDAZOLAM HCL 2 MG/2ML IJ SOLN
INTRAMUSCULAR | Status: DC | PRN
  Administered 2016-02-28 (×2): 1 mg via INTRAVENOUS

## 2016-02-28 MED ORDER — DEXAMETHASONE SODIUM PHOSPHATE 4 MG/ML IJ SOLN
INTRAMUSCULAR | Status: DC | PRN
  Administered 2016-02-28: 4 mg via INTRAVENOUS

## 2016-02-28 MED ORDER — SODIUM CHLORIDE 0.9 % IJ SOLN
INTRAMUSCULAR | Status: AC
  Filled 2016-02-28: qty 10

## 2016-02-28 MED ORDER — ROCURONIUM BROMIDE 100 MG/10ML IV SOLN
INTRAVENOUS | Status: AC
  Filled 2016-02-28: qty 1

## 2016-02-28 MED ORDER — PROPOFOL 10 MG/ML IV BOLUS
INTRAVENOUS | Status: AC
  Filled 2016-02-28: qty 20

## 2016-02-28 MED ORDER — ONDANSETRON HCL 4 MG/2ML IJ SOLN
INTRAMUSCULAR | Status: AC
  Filled 2016-02-28: qty 2

## 2016-02-28 MED ORDER — LIDOCAINE HCL (CARDIAC) 20 MG/ML IV SOLN
INTRAVENOUS | Status: DC | PRN
  Administered 2016-02-28: 100 mg via INTRAVENOUS

## 2016-02-28 MED ORDER — DIPHENHYDRAMINE HCL 50 MG/ML IJ SOLN
INTRAMUSCULAR | Status: AC
  Filled 2016-02-28: qty 1

## 2016-02-28 MED ORDER — BUPIVACAINE HCL (PF) 0.25 % IJ SOLN
INTRAMUSCULAR | Status: AC
  Filled 2016-02-28: qty 30

## 2016-02-28 MED ORDER — METOCLOPRAMIDE HCL 5 MG/ML IJ SOLN
INTRAMUSCULAR | Status: DC | PRN
  Administered 2016-02-28: 10 mg via INTRAVENOUS

## 2016-02-28 MED ORDER — METOCLOPRAMIDE HCL 5 MG/ML IJ SOLN
INTRAMUSCULAR | Status: AC
  Filled 2016-02-28: qty 2

## 2016-02-28 MED ORDER — SUGAMMADEX SODIUM 500 MG/5ML IV SOLN
INTRAVENOUS | Status: AC
  Filled 2016-02-28: qty 5

## 2016-02-28 MED ORDER — HYDROMORPHONE HCL 1 MG/ML IJ SOLN
INTRAMUSCULAR | Status: DC | PRN
  Administered 2016-02-28: 1 mg via INTRAVENOUS

## 2016-02-28 MED ORDER — HYDROCODONE-ACETAMINOPHEN 5-325 MG PO TABS: 1.0000 | ORAL_TABLET | ORAL | 0 refills | Status: DC | PRN

## 2016-02-28 MED ORDER — KETOROLAC TROMETHAMINE 30 MG/ML IJ SOLN
INTRAMUSCULAR | Status: AC
  Filled 2016-02-28: qty 1

## 2016-02-28 MED ORDER — BUPIVACAINE HCL (PF) 0.25 % IJ SOLN
INTRAMUSCULAR | Status: DC | PRN
  Administered 2016-02-28: 13 mL

## 2016-02-28 MED ORDER — PROMETHAZINE HCL 25 MG/ML IJ SOLN: 6.2500 mg | INTRAMUSCULAR | Status: DC | PRN

## 2016-02-28 MED ORDER — SUGAMMADEX SODIUM 200 MG/2ML IV SOLN
INTRAVENOUS | Status: DC | PRN
  Administered 2016-02-28: 200 mg via INTRAVENOUS

## 2016-02-28 SURGICAL SUPPLY — 27 items
CATH ROBINSON RED A/P 16FR (CATHETERS) ×1 IMPLANT
CLOTH BEACON ORANGE TIMEOUT ST (SAFETY) ×2 IMPLANT
DRSG OPSITE POSTOP 3X4 (GAUZE/BANDAGES/DRESSINGS) IMPLANT
DURAPREP 26ML APPLICATOR (WOUND CARE) ×2 IMPLANT
GLOVE BIO SURGEON STRL SZ8 (GLOVE) ×1 IMPLANT
GLOVE BIOGEL PI IND STRL 7.0 (GLOVE) ×1 IMPLANT
GLOVE BIOGEL PI INDICATOR 7.0 (GLOVE) ×1
GLOVE ORTHO TXT STRL SZ7.5 (GLOVE) ×2 IMPLANT
GOWN STRL REUS W/TWL LRG LVL3 (GOWN DISPOSABLE) ×4 IMPLANT
LIQUID BAND (GAUZE/BANDAGES/DRESSINGS) ×2 IMPLANT
NEEDLE INSUFFLATION 120MM (ENDOMECHANICALS) ×2 IMPLANT
PACK LAPAROSCOPY BASIN (CUSTOM PROCEDURE TRAY) ×2 IMPLANT
PACK TRENDGUARD 450 HYBRID PRO (MISCELLANEOUS) IMPLANT
PACK TRENDGUARD 600 HYBRD PROC (MISCELLANEOUS) IMPLANT
PROTECTOR NERVE ULNAR (MISCELLANEOUS) ×3 IMPLANT
SLEEVE XCEL OPT CAN 5 100 (ENDOMECHANICALS) ×1 IMPLANT
SUT VIC AB 3-0 CTX 36 (SUTURE) IMPLANT
SUT VIC AB 3-0 PS2 18 (SUTURE) ×2
SUT VIC AB 3-0 PS2 18XBRD (SUTURE) ×1 IMPLANT
SUT VICRYL 0 UR6 27IN ABS (SUTURE) ×1 IMPLANT
TOWEL OR 17X24 6PK STRL BLUE (TOWEL DISPOSABLE) ×4 IMPLANT
TRENDGUARD 450 HYBRID PRO PACK (MISCELLANEOUS) ×2
TRENDGUARD 600 HYBRID PROC PK (MISCELLANEOUS)
TROCAR XCEL NON-BLD 11X100MML (ENDOMECHANICALS) ×2 IMPLANT
TROCAR XCEL NON-BLD 5MMX100MML (ENDOMECHANICALS) ×2 IMPLANT
WARMER LAPAROSCOPE (MISCELLANEOUS) ×2 IMPLANT
WATER STERILE IRR 1000ML POUR (IV SOLUTION) ×2 IMPLANT

## 2016-02-28 NOTE — Interval H&P Note (Signed)
History and Physical Interval Note:  02/28/2016 7:09 AM  Christina [name]  has presented today for surgery, with the diagnosis of Sterilization  The various methods of treatment have been discussed with the patient and family. After consideration of risks, benefits and other options for treatment, the patient has consented to  Procedure(s): LAPAROSCOPIC TUBAL LIGATION (Bilateral) as a surgical intervention .  The patient's history has been reviewed, patient examined, no change in status, stable for surgery.  I have reviewed the patient's chart and labs.  Questions were answered to the patient's satisfaction.     Wardell Pokorski D

## 2016-02-28 NOTE — Discharge Instructions (Signed)
DISCHARGE INSTRUCTIONS: Laparoscopy ° °The following instructions have been prepared to help you care for yourself upon your return home today. ° °Wound care: °• Do not get the incision wet for the first 24 hours. The incision should be kept clean and dry. °• The Band-Aids or dressings may be removed the day after surgery. °• Should the incision become sore, red, and swollen after the first week, check with your doctor. ° °Personal hygiene: °• Shower the day after your procedure. ° °Activity and limitations: °• Do NOT drive or operate any equipment today. °• Do NOT lift anything more than 15 pounds for 2-3 weeks after surgery. °• Do NOT rest in bed all day. °• Walking is encouraged. Walk each day, starting slowly with 5-minute walks 3 or 4 times a day. Slowly increase the length of your walks. °• Walk up and down stairs slowly. °• Do NOT do strenuous activities, such as golfing, playing tennis, bowling, running, biking, weight lifting, gardening, mowing, or vacuuming for 2-4 weeks. Ask your doctor when it is okay to start. ° °Diet: Eat a light meal as desired this evening. You may resume your usual diet tomorrow. ° °Return to work: This is dependent on the type of work you do. For the most part you can return to a desk job within a week of surgery. If you are more active at work, please discuss this with your doctor. ° °What to expect after your surgery: You may have a slight burning sensation when you urinate on the first day. You may have a very small amount of blood in the urine. Expect to have a small amount of vaginal discharge/light bleeding for 1-2 weeks. It is not unusual to have abdominal soreness and bruising for up to 2 weeks. You may be tired and need more rest for about 1 week. You may experience shoulder pain for 24-72 hours. Lying flat in bed may relieve it. ° °Call your doctor for any of the following: °• Develop a fever of 100.4 or greater °• Inability to urinate 6 hours after discharge from  hospital °• Severe pain not relieved by pain medications °• Persistent of heavy bleeding at incision site °• Redness or swelling around incision site after a week °• Increasing nausea or vomiting ° °Patient Signature________________________________________ °Nurse Signature_________________________________________DISCHARGE INSTRUCTIONS: Laparoscopy ° °The following instructions have been prepared to help you care for yourself upon your return home today. ° °Wound care: °• Do not get the incision wet for the first 24 hours. The incision should be kept clean and dry. °• The Band-Aids or dressings may be removed the day after surgery. °• Should the incision become sore, red, and swollen after the first week, check with your doctor. ° °Personal hygiene: °• Shower the day after your procedure. ° °Activity and limitations: °• Do NOT drive or operate any equipment today. °• Do NOT lift anything more than 15 pounds for 2-3 weeks after surgery. °• Do NOT rest in bed all day. °• Walking is encouraged. Walk each day, starting slowly with 5-minute walks 3 or 4 times a day. Slowly increase the length of your walks. °• Walk up and down stairs slowly. °• Do NOT do strenuous activities, such as golfing, playing tennis, bowling, running, biking, weight lifting, gardening, mowing, or vacuuming for 2-4 weeks. Ask your doctor when it is okay to start. ° °Diet: Eat a light meal as desired this evening. You may resume your usual diet tomorrow. ° °Return to work: This is dependent on   the type of work you do. For the most part you can return to a desk job within a week of surgery. If you are more active at work, please discuss this with your doctor.  What to expect after your surgery: You may have a slight burning sensation when you urinate on the first day. You may have a very small amount of blood in the urine. Expect to have a small amount of vaginal discharge/light bleeding for 1-2 weeks. It is not unusual to have abdominal soreness  and bruising for up to 2 weeks. You may be tired and need more rest for about 1 week. You may experience shoulder pain for 24-72 hours. Lying flat in bed may relieve it.  Call your doctor for any of the following:  Develop a fever of 100.4 or greater  Inability to urinate 6 hours after discharge from hospital  Severe pain not relieved by pain medications  Persistent of heavy bleeding at incision site  Redness or swelling around incision site after a week  Increasing nausea or vomiting  Patient Signature________________________________________ Nurse Signature_________________________________________Routine instructions for BTL

## 2016-02-28 NOTE — Op Note (Signed)
Preoperative diagnosis: Desires surgical sterility Postoperative diagnosis: Same Procedure: Laparoscopic bilateral salpingectomy Surgeon: Lavina Hammanodd Santresa Levett M.D. Anesthesia: Gen. Endotracheal tube Findings: She had a normal abdomen and pelvis with normal uterus tubes and ovaries, 2-3 cm functional cyst on left ovary  Specimens: Bilateral fallopian tubes Estimated blood loss: Minimal Complications: None  Procedure in detail  The patient was taken to the operating room and placed in the dorsosupine position. General anesthesia was induced. Her legs were placed in mobile stirrups and her left arm was tucked to her side. Abdomen perineum and vagina were then prepped and draped in the usual sterile fashion, a Hulka tenaculum was applied to the cervix for uterine manipulation. Infraumbilical skin was then infiltrated with quarter percent Marcaine and a 1 cm vertical incision was made. The veress needle was inserted into the peritoneal cavity and placement confirmed by the water drop test and an opening pressure of 5 mm of mercury. CO2 was insufflated to a pressure of 12 mm of mercury and the veress needle was removed. A 10/11 disposable trocar was then introduced with direct visualization with the laparoscope. A 5 mm port was then placed low in the midline also under direct visualization. Inspection revealed the above-mentioned findings with normal anatomy. The distal end of each tube was grasped and elevated. Using bipolar cautery I was able to free the distal end of each tube from the ovary and fulgurate across the mesosalpinx and then a proximal portion of the fallopian tube. Scissors were then used to remove the fallopian tube. A small amount of bleeding from each side was controlled with bipolar cautery. This is done bilaterally without difficulty. Both tubes were removed through the umbilical trocar. The 5 mm port was removed under direct visualization. All gas was allowed to deflate from the abdomen and the  umbilical trocar was removed. One figure-of-eight suture of 0 Vicryl was placed in the umbilical incision. Skin incisions were then closed with interrupted subcuticular sutures of 4-0 Vicryl followed by Dermabond. The Hulka tenaculum was removed. The patient was taken down from stirrups. She was awakened in the operating room and taken to the recovery room in stable condition after tolerating the procedure well. Counts were correct and she had PAS hose on throughout the procedure.

## 2016-02-28 NOTE — Transfer of Care (Signed)
Immediate Anesthesia Transfer of Care Note  Patient: Christina [name]  Procedure(s) Performed: Procedure(s): LAPAROSCOPIC TUBAL LIGATION (Bilateral)  Patient Location: PACU  Anesthesia Type:General  Level of Consciousness: awake, alert  and oriented  Airway & Oxygen Therapy: Patient Spontanous Breathing and Patient connected to nasal cannula oxygen  Post-op Assessment: Report given to RN and Post -op Vital signs reviewed and stable  Post vital signs: Reviewed and stable  Last Vitals:  Vitals:   02/28/16 0629  BP: 130/81  Pulse: 86  Resp: 20  Temp: 36.6 C    Last Pain:  Vitals:   02/28/16 0629  TempSrc: Oral  PainSc: 3       Patients Stated Pain Goal: 3 (02/28/16 0629)  Complications: No apparent anesthesia complications

## 2016-02-28 NOTE — Anesthesia Procedure Notes (Signed)
Procedure Name: Intubation Date/Time: 02/28/2016 7:37 AM Performed by: Graciela HusbandsFUSSELL, Nethan Caudillo O Pre-anesthesia Checklist: Patient identified, Timeout performed, Emergency Drugs available, Suction available and Patient being monitored Patient Re-evaluated:Patient Re-evaluated prior to inductionOxygen Delivery Method: Circle system utilized Preoxygenation: Pre-oxygenation with 100% oxygen Intubation Type: IV induction Ventilation: Mask ventilation without difficulty Laryngoscope Size: Mac and 3 Grade View: Grade I Tube type: Oral Tube size: 7.0 mm Number of attempts: 1 Airway Equipment and Method: Stylet Placement Confirmation: ETT inserted through vocal cords under direct vision,  positive ETCO2 and breath sounds checked- equal and bilateral Secured at: 21.5 cm Tube secured with: Tape Dental Injury: Teeth and Oropharynx as per pre-operative assessment

## 2016-03-02 ENCOUNTER — Encounter (HOSPITAL_COMMUNITY): Payer: Self-pay | Admitting: Obstetrics and Gynecology

## 2016-03-02 DIAGNOSIS — F419 Anxiety disorder, unspecified: Secondary | ICD-10-CM | POA: Diagnosis not present

## 2016-03-02 DIAGNOSIS — F3181 Bipolar II disorder: Secondary | ICD-10-CM | POA: Diagnosis not present

## 2016-03-03 NOTE — Anesthesia Postprocedure Evaluation (Signed)
Anesthesia Post Note  Patient: Christina HurterChristine W [name]  Procedure(s) Performed: Procedure(s) (LRB): LAPAROSCOPIC TUBAL LIGATION (Bilateral)  Patient location during evaluation: PACU Anesthesia Type: General Level of consciousness: awake and sedated Pain management: pain level controlled Vital Signs Assessment: post-procedure vital signs reviewed and stable Respiratory status: spontaneous breathing Cardiovascular status: stable Postop Assessment: adequate PO intake and no signs of nausea or vomiting Anesthetic complications: no     Last Vitals:  Vitals:   02/28/16 0945 02/28/16 1015  BP: 110/64 118/76  Pulse: (!) 101 90  Resp: 15 18  Temp: 36.8 C     Last Pain:  Vitals:   02/28/16 0945  TempSrc:   PainSc: 2    Pain Goal: Patients Stated Pain Goal: 3 (02/28/16 0629)               Jaedyn Lard JR,JOHN Susann GivensFRANKLIN

## 2016-03-17 DIAGNOSIS — F3181 Bipolar II disorder: Secondary | ICD-10-CM | POA: Diagnosis not present

## 2016-03-17 DIAGNOSIS — F419 Anxiety disorder, unspecified: Secondary | ICD-10-CM | POA: Diagnosis not present

## 2016-03-24 DIAGNOSIS — F909 Attention-deficit hyperactivity disorder, unspecified type: Secondary | ICD-10-CM | POA: Diagnosis not present

## 2016-04-01 ENCOUNTER — Ambulatory Visit: Payer: BLUE CROSS/BLUE SHIELD | Admitting: Internal Medicine

## 2016-04-01 ENCOUNTER — Other Ambulatory Visit: Payer: Self-pay

## 2016-04-02 ENCOUNTER — Encounter: Payer: Self-pay | Admitting: Internal Medicine

## 2016-04-29 ENCOUNTER — Telehealth: Payer: Self-pay | Admitting: *Deleted

## 2016-04-29 NOTE — Telephone Encounter (Signed)
Called, spoke with pt. Pt stated she has been taking 1 tablet (25 mg) of Metoprolol twice a day instead of 1/2 tab (12.5 mg). Pt is unsure of when she started taking the increased dose. Informed Rx dose is 1/2 tab 12.5 mg twice a day. Pt stated HR has been improved taking the 25 mg twice a day. Pt takes BP and Hr every other day. Pt stated HR has been 100s and BP 120/80. Pt stated she has had some generalized discomfort around the right breast and clavicle area. If she presses on her skin it feel better. Pt thinks it could be anxiety related. Informed pt it could be muscular (pt has a newborn). I will forward to Dr. Ladona Ridgelaylor to advise.

## 2016-04-30 ENCOUNTER — Telehealth: Payer: Self-pay

## 2016-04-30 MED ORDER — METOPROLOL TARTRATE 25 MG PO TABS: 25.0000 mg | ORAL_TABLET | Freq: Two times a day (BID) | ORAL | 6 refills | Status: DC

## 2016-04-30 NOTE — Telephone Encounter (Signed)
Ok to increase metoprolol. GT

## 2016-04-30 NOTE — Telephone Encounter (Signed)
Called, spoke with pt. Informed pt Dr. Ladona Ridgelaylor stated is it okay to increase to Metoprolol 25 mg twice a day. Pt stated she just picked up her new Rx. Informed I will put a noted for pharmacy to hold med until needed. Pt verbalized understanding and agreed with plan.

## 2016-05-01 DIAGNOSIS — F419 Anxiety disorder, unspecified: Secondary | ICD-10-CM | POA: Diagnosis not present

## 2016-05-01 DIAGNOSIS — F3181 Bipolar II disorder: Secondary | ICD-10-CM | POA: Diagnosis not present

## 2016-05-01 DIAGNOSIS — F909 Attention-deficit hyperactivity disorder, unspecified type: Secondary | ICD-10-CM | POA: Diagnosis not present

## 2016-05-06 DIAGNOSIS — F419 Anxiety disorder, unspecified: Secondary | ICD-10-CM | POA: Diagnosis not present

## 2016-05-06 DIAGNOSIS — F3181 Bipolar II disorder: Secondary | ICD-10-CM | POA: Diagnosis not present

## 2016-05-15 ENCOUNTER — Encounter: Payer: Self-pay | Admitting: Internal Medicine

## 2016-05-15 ENCOUNTER — Ambulatory Visit (INDEPENDENT_AMBULATORY_CARE_PROVIDER_SITE_OTHER): Payer: BLUE CROSS/BLUE SHIELD | Admitting: Internal Medicine

## 2016-05-15 VITALS — BP 106/70 | HR 96 | Ht 67.0 in | Wt 224.6 lb

## 2016-05-15 DIAGNOSIS — R002 Palpitations: Secondary | ICD-10-CM | POA: Diagnosis not present

## 2016-05-15 DIAGNOSIS — R000 Tachycardia, unspecified: Secondary | ICD-10-CM | POA: Diagnosis not present

## 2016-05-15 NOTE — Patient Instructions (Signed)
Medication Instructions:    Your physician recommends that you continue on your current medications as directed. Please refer to the Current Medication list given to you today.  - If you need a refill on your cardiac medications before your next appointment, please call your pharmacy.   Labwork:  None ordered  Testing/Procedures:  None ordered  Follow-Up:  Your physician wants you to follow-up in: 1 year with Dr. Taylor.  You will receive a reminder letter in the mail two months in advance. If you don't receive a letter, please call our office to schedule the follow-up appointment.  Thank you for choosing [facility]!!          

## 2016-05-15 NOTE — Progress Notes (Signed)
HPI [name] returns today for ongoing evaluation of SVT/sinus tachycardia. She has had some non-exertional chest pain. She is a pleasant 31 yo woman with a h/o palpitations but no documented SVT. She has known sinus tachycardia. She recently gave birth and her baby is doing well. She has had tubal ligation. The patient's heart racing is controlled. She wonders how vigorously to exercise.  Allergies  Allergen Reactions  . Sulfa Antibiotics Hives, Itching and Swelling  . Latex Itching     Current Outpatient Prescriptions  Medication Sig Dispense Refill  . metoprolol tartrate (LOPRESSOR) 25 MG tablet Take 1 tablet (25 mg total) by mouth 2 (two) times daily. 60 tablet 6  . Prenatal Vit-Fe Fumarate-FA (PRENATAL MULTIVITAMIN) TABS tablet Take 1 tablet by mouth at bedtime.      No current facility-administered medications for this visit.      Past Medical History:  Diagnosis Date  . Anemia    borderline anemic  . Anxiety   . Depression   . Eczema   . GERD (gastroesophageal reflux disease)    with pregnancy  . History of hip fracture 10/2010   still has pain, right hip  . Hyperemesis arising during pregnancy   . Increased heart rate    resting heart rate 100-120  . Low energy    on bedrest during pregnancy trying to regain strength  . SVD (spontaneous vaginal delivery) 12/16/2015  . SVT (supraventricular tachycardia) (HCC)   . UTI (lower urinary tract infection)   . Vaginitis and vulvovaginitis     ROS:   All systems reviewed and negative except as noted in the HPI.   Past Surgical History:  Procedure Laterality Date  . LAPAROSCOPIC TUBAL LIGATION Bilateral 02/28/2016   Procedure: LAPAROSCOPIC TUBAL LIGATION;  Surgeon: Lavina Hammanodd Meisinger, MD;  Location: [facility];  Service: Gynecology;  Laterality: Bilateral;  . LAPAROSCOPY Left 06/20/2012   Procedure: LAPAROSCOPY OPERATIVE;  Surgeon: Lavina Hammanodd Meisinger, MD;  Location: [facility];  Service: Gynecology;  Laterality: Left;  OPEN  LAPAROSCOPY, removal of portion of left ovarian cyst wall  . WISDOM TOOTH EXTRACTION       Family History  Problem Relation Age of Onset  . Alcohol abuse Mother   . Anxiety disorder Mother   . Asthma Mother   . Mental illness Mother     bipolar  . Depression Mother   . Hypothyroidism Mother   . Ovarian cysts Mother   . Hypertension Father   . Endometriosis Sister   . Ovarian cysts Sister   . Alcohol abuse Brother   . Crohn's disease Brother   . Alcohol abuse Maternal Aunt   . Cancer Maternal Aunt     breast  . Hypothyroidism Maternal Aunt   . Miscarriages / Stillbirths Maternal Aunt     stillbirth  . Alcohol abuse Maternal Uncle   . Asthma Maternal Grandmother   . Mental illness Maternal Grandmother     bipolar  . Asthma Maternal Grandfather   . Diabetes Maternal Grandfather   . Hypertension Maternal Grandfather   . Asthma Paternal Grandmother   . Hypertension Paternal Grandmother   . Asthma Paternal Grandfather   . Diabetes Paternal Grandfather   . Heart disease Paternal Grandfather   . Hypertension Paternal Grandfather      Social History   Social History  . Marital status: Married    Spouse name: N/A  . Number of children: N/A  . Years of education: N/A   Occupational History  .  Not on file.   Social History Main Topics  . Smoking status: Never Smoker  . Smokeless tobacco: Never Used  . Alcohol use Yes     Comment: occ  . Drug use: No  . Sexual activity: Yes    Birth control/ protection: None   Other Topics Concern  . Not on file   Social History Narrative  . No narrative on file     BP 106/70   Pulse 96   Ht 5\' 7"  (1.702 m)   Wt 224 lb 9.6 oz (101.9 kg)   SpO2 98%   BMI 35.18 kg/m   Physical Exam:  Well appearing 31 yo woman, obese, NAD HEENT: Unremarkable Neck:  6 cm JVD, no thyromegally Lymphatics:  No adenopathy Back:  No CVA tenderness Lungs:  Clear with no wheezes HEART:  Regular rate rhythm, no murmurs, no rubs, no  clicks Abd:  soft, positive bowel sounds, no organomegally, no rebound, no guarding Ext:  2 plus pulses, no edema, no cyanosis, no clubbing Skin:  No rashes no nodules Neuro:  CN II through XII intact, motor grossly intact  EKG - nsr with no pre-excitation  Assess/Plan: 1. Palpitations - I suspect she has both SVT and sinus tachycardia. We discussed the underlying etiology of both. She will continue low dose metoprolol.  2. SVT - cannot find any definitive evidence of SVT. She will undergo watchful waiting. She will continue toprol. 3. Anxiety - she has been reassured. She has a lot of stress with 3 small children.  4. Chest pain - on palpation, her chest discomfort actually improves and I strongly suspect that this is musculoskeletal.  Leonia Reeves.D.

## 2016-06-02 DIAGNOSIS — F3181 Bipolar II disorder: Secondary | ICD-10-CM | POA: Diagnosis not present

## 2016-06-12 ENCOUNTER — Other Ambulatory Visit: Payer: Self-pay | Admitting: Internal Medicine

## 2016-06-12 NOTE — Telephone Encounter (Signed)
Medication Detail    Disp Refills Start End   metoprolol tartrate (LOPRESSOR) 25 MG tablet 60 tablet 6 04/30/2016 07/29/2016   Sig - Route: Take 1 tablet (25 mg total) by mouth 2 (two) times daily. - Oral   Notes to Pharmacy: Hold Rx until needed   E-Prescribing Status: Receipt confirmed by pharmacy (04/30/2016 5:27 PM EST)   Pharmacy   [id number] [place], [place] - [address]

## 2016-06-17 DIAGNOSIS — F3181 Bipolar II disorder: Secondary | ICD-10-CM | POA: Diagnosis not present

## 2016-07-07 DIAGNOSIS — F909 Attention-deficit hyperactivity disorder, unspecified type: Secondary | ICD-10-CM | POA: Diagnosis not present

## 2016-07-07 DIAGNOSIS — F419 Anxiety disorder, unspecified: Secondary | ICD-10-CM | POA: Diagnosis not present

## 2016-07-07 DIAGNOSIS — F3181 Bipolar II disorder: Secondary | ICD-10-CM | POA: Diagnosis not present

## 2016-07-08 DIAGNOSIS — F411 Generalized anxiety disorder: Secondary | ICD-10-CM | POA: Diagnosis not present

## 2016-07-08 DIAGNOSIS — F3181 Bipolar II disorder: Secondary | ICD-10-CM | POA: Diagnosis not present

## 2016-07-08 DIAGNOSIS — F900 Attention-deficit hyperactivity disorder, predominantly inattentive type: Secondary | ICD-10-CM | POA: Diagnosis not present

## 2016-08-05 DIAGNOSIS — F900 Attention-deficit hyperactivity disorder, predominantly inattentive type: Secondary | ICD-10-CM | POA: Diagnosis not present

## 2016-09-15 DIAGNOSIS — F3181 Bipolar II disorder: Secondary | ICD-10-CM | POA: Diagnosis not present

## 2016-09-22 DIAGNOSIS — F3181 Bipolar II disorder: Secondary | ICD-10-CM | POA: Diagnosis not present

## 2016-10-09 DIAGNOSIS — F3181 Bipolar II disorder: Secondary | ICD-10-CM | POA: Diagnosis not present

## 2016-10-22 DIAGNOSIS — F900 Attention-deficit hyperactivity disorder, predominantly inattentive type: Secondary | ICD-10-CM | POA: Diagnosis not present

## 2016-10-25 DIAGNOSIS — J069 Acute upper respiratory infection, unspecified: Secondary | ICD-10-CM | POA: Diagnosis not present

## 2016-11-17 DIAGNOSIS — F3181 Bipolar II disorder: Secondary | ICD-10-CM | POA: Diagnosis not present

## 2016-12-08 NOTE — Progress Notes (Signed)
Cardiology Office Note Date:  12/11/2016  Patient ID:  Christina [name], DOB 10/12/1985, MRN [id number] PCP:  Daisy Florooss, Charles Alan, MD  Cardiologist:  Dr.Taylor   Chief Complaint: palpitations  History of Present Illness: Christina [name] is a 31 y.o. female with history of palpitations, noted with and suspect SVT though not documented.  She comes in today to be seen for Dr. Ladona Ridgelaylor, last seen by him in January, at that time on metoprolol, he mentions she has anxiety as well.  She comes in today concerned that she is still having palpitations, generally about the same as they have been, though never so bad that she has felt need to go to the ER like she did when she was pregnant.  (over a year ago)  She is s/p tubal ligation and not currently pregnant.  She reports feeling like her HR races and startles easily with minimal events.  She has unrelated ht flashes that she feels are unusual though not connected to her palpitations.  She denies SOB, near syncope or syncope with/without the palpitations.  She reports transient chest tightness associated with times of anxiety only, not with her palpitations or with exertional.  She sleeps poorly waking 1-2 x nightly to check on the baby or use the bathroom.  She reports that was felt she may be overwhelmed with the kids/works from home so they got a nanny who helps quite a bit but despite this, she feels like nothing has changed with her palpitations/symptoms.  She take amphetamine for her ADHD and has been on it for 10 years, she does not feel like this is attributing to her symptoms, and is on a lower dose in the last year then previous.  Past Medical History:  Diagnosis Date  . Anemia    borderline anemic  . Anxiety   . Depression   . Eczema   . GERD (gastroesophageal reflux disease)    with pregnancy  . History of hip fracture 10/2010   still has pain, right hip  . Hyperemesis arising during pregnancy   . Increased heart rate    resting heart  rate 100-120  . Low energy    on bedrest during pregnancy trying to regain strength  . SVD (spontaneous vaginal delivery) 12/16/2015  . SVT (supraventricular tachycardia) (HCC)   . UTI (lower urinary tract infection)   . Vaginitis and vulvovaginitis     Past Surgical History:  Procedure Laterality Date  . LAPAROSCOPIC TUBAL LIGATION Bilateral 02/28/2016   Procedure: LAPAROSCOPIC TUBAL LIGATION;  Surgeon: Lavina Hammanodd Meisinger, MD;  Location: [facility];  Service: Gynecology;  Laterality: Bilateral;  . LAPAROSCOPY Left 06/20/2012   Procedure: LAPAROSCOPY OPERATIVE;  Surgeon: Lavina Hammanodd Meisinger, MD;  Location: [facility];  Service: Gynecology;  Laterality: Left;  OPEN LAPAROSCOPY, removal of portion of left ovarian cyst wall  . WISDOM TOOTH EXTRACTION      Current Outpatient Prescriptions  Medication Sig Dispense Refill  . amphetamine-dextroamphetamine (ADDERALL) 20 MG tablet Take 10 mg by mouth daily.    Marland Kitchen. LORazepam (ATIVAN) 0.5 MG tablet Take 0.5 mg by mouth 2 (two) times daily.    . metoprolol tartrate (LOPRESSOR) 25 MG tablet Take 1 tablet (25 mg total) by mouth 2 (two) times daily. 60 tablet 6   No current facility-administered medications for this visit.     Allergies:   Sulfa antibiotics and Latex   Social History:  The patient  reports that she has never smoked. She has never used smokeless tobacco. She  reports that she drinks alcohol. She reports that she does not use drugs.   Family History:  The patient's family history includes Alcohol abuse in her brother, maternal aunt, maternal uncle, and mother; Anxiety disorder in her mother; Asthma in her maternal grandfather, maternal grandmother, mother, paternal grandfather, and paternal grandmother; Cancer in her maternal aunt; Crohn's disease in her brother; Depression in her mother; Diabetes in her maternal grandfather and paternal grandfather; Endometriosis in her sister; Heart disease in her paternal grandfather; Hypertension in her father, maternal  grandfather, paternal grandfather, and paternal grandmother; Hypothyroidism in her maternal aunt and mother; Mental illness in her maternal grandmother and mother; Miscarriages / [place] in her maternal aunt; Ovarian cysts in her mother and sister.  ROS:  Please see the history of present illness.  All other systems are reviewed and otherwise negative.   PHYSICAL EXAM:  VS:  BP 120/60 (BP Location: Right Arm, Patient Position: Sitting, Cuff Size: Large)   Pulse 86   Ht 5\' 7"  (1.702 m)   Wt 213 lb (96.6 kg)   SpO2 98%   BMI 33.36 kg/m  BMI: Body mass index is 33.36 kg/m. Well nourished, well developed, in no acute distress  HEENT: normocephalic, atraumatic  Neck: no JVD, carotid bruits or masses Cardiac:  RRR; no significant murmurs, no rubs, or gallops Lungs:  CTA b/l, no wheezing, rhonchi or rales  Abd: soft, nontender, obese MS: no deformity or atrophy Ext:  no edema  Skin: warm and dry, no rash Neuro:  No gross deficits appreciated Psych: euthymic mood, full affect   EKG:  Done today and reviewed by myself is SR, 74bpm, PR , QRS 82ms, Qtc , no ischemic changes, normal EKG  09/13/15: TTE Study Conclusions - Left ventricle: The cavity size was normal. Wall thickness was   normal. Systolic function was normal. The estimated ejection   fraction was in the range of 55% to 60%. Wall motion was normal;   there were no regional wall motion abnormalities. Left   ventricular diastolic function parameters were normal.  09/13/15: event monitor Sinus rhythm, sinus tachycardia with occasional PACs, short burst PAT Palpitations correlated with skips from upper chamber (PACs, short burst  PAT) Chest pressure did not correlate with arrhythmia    Recent Labs: 02/19/2016: BUN 15; Creatinine, Ser 0.68; Hemoglobin 11.6; Platelets 274; Potassium 4.2; Sodium 139  No results found for requested labs within last 8760 hours.   CrCl cannot be calculated (Patient's most recent lab  result is older than the maximum 21 days allowed.).   Wt Readings from Last 3 Encounters:  12/11/16 213 lb (96.6 kg)  05/15/16 224 lb 9.6 oz (101.9 kg)  02/19/16 230 lb 8 oz (104.6 kg)     Other studies reviewed: Additional studies/records reviewed today include: summarized above  ASSESSMENT AND PLAN:  1. Palpitations     Dr. Ladona Ridgel suspects SVT and ST  2. CP     Associated with anxiety, not exertional      Almost daily symptoms, no associated symptoms with the palpitations and different then her "tachycardia thatgoes 200".  She is encouraged to keep adequately hydrated and exercise. She reports she is scared to exercise that something bad may happen.  Discussed talking with her PMD regarding he ADHD medicine to see if a non-stimulant may be an option though she is reluctant to consider this and does not feel it is related.  Disposition: Will have her wear a 2 week monitor, suspect ST, and an ETT with  atypical CP and see HR/rhythm response to exercise.  Will see her back in 6 weeks, sooner if needed.  Current medicines are reviewed at length with the patient today.  The patient did not have any concerns regarding medicines.  Judith Blonder, PA-C 12/11/2016 9:17 AM     [facility] [address] [place] [address] [phone] (office)  [phone] (fax)

## 2016-12-11 ENCOUNTER — Ambulatory Visit (INDEPENDENT_AMBULATORY_CARE_PROVIDER_SITE_OTHER): Payer: BLUE CROSS/BLUE SHIELD | Admitting: Physician Assistant

## 2016-12-11 ENCOUNTER — Encounter: Payer: Self-pay | Admitting: Physician Assistant

## 2016-12-11 ENCOUNTER — Encounter (INDEPENDENT_AMBULATORY_CARE_PROVIDER_SITE_OTHER): Payer: Self-pay

## 2016-12-11 VITALS — BP 120/60 | HR 86 | Ht 67.0 in | Wt 213.0 lb

## 2016-12-11 DIAGNOSIS — R002 Palpitations: Secondary | ICD-10-CM

## 2016-12-11 DIAGNOSIS — R079 Chest pain, unspecified: Secondary | ICD-10-CM | POA: Diagnosis not present

## 2016-12-11 DIAGNOSIS — I471 Supraventricular tachycardia: Secondary | ICD-10-CM | POA: Diagnosis not present

## 2016-12-11 NOTE — Patient Instructions (Signed)
Medication Instructions:   Your physician recommends that you continue on your current medications as directed. Please refer to the Current Medication list given to you today.   If you need a refill on your cardiac medications before your next appointment, please call your pharmacy.  Labwork: NONE ORDERED  TODAY    Testing/Procedures: Your physician has requested that you have an exercise tolerance test. For further information please visit [email or web address] Please also follow instruction sheet, as given.  Your physician has recommended that you wear an event monitor. Event monitors are medical devices that record the heart's electrical activity. Doctors most often us these monitors to diagnose arrhythmias. Arrhythmias are problems with the speed or rhythm of the heartbeat. The monitor is a small, portable device. You can wear one while you do your normal daily activities. This is usually used to diagnose what is causing palpitations/syncope (passing out).     Follow-Up: IN 6 WEEKS WITH RENEE    Any Other Special Instructions Will Be Listed Below (If Applicable).

## 2016-12-21 DIAGNOSIS — F3181 Bipolar II disorder: Secondary | ICD-10-CM | POA: Diagnosis not present

## 2016-12-31 ENCOUNTER — Ambulatory Visit (INDEPENDENT_AMBULATORY_CARE_PROVIDER_SITE_OTHER): Payer: BLUE CROSS/BLUE SHIELD

## 2016-12-31 ENCOUNTER — Other Ambulatory Visit: Payer: Self-pay | Admitting: Physician Assistant

## 2016-12-31 DIAGNOSIS — R079 Chest pain, unspecified: Secondary | ICD-10-CM

## 2016-12-31 DIAGNOSIS — I471 Supraventricular tachycardia: Secondary | ICD-10-CM

## 2016-12-31 LAB — EXERCISE TOLERANCE TEST
CHL CUP STRESS STAGE 1 GRADE: 0 %
CHL CUP STRESS STAGE 1 HR: 96 {beats}/min
CHL CUP STRESS STAGE 2 GRADE: 0 %
CHL CUP STRESS STAGE 2 SPEED: 1 mph
CHL CUP STRESS STAGE 4 GRADE: 10 %
CHL CUP STRESS STAGE 4 HR: 121 {beats}/min
CHL CUP STRESS STAGE 4 SPEED: 1.7 mph
CHL CUP STRESS STAGE 5 DBP: 60 mmHg
CHL CUP STRESS STAGE 5 GRADE: 12 %
CHL CUP STRESS STAGE 5 HR: 153 {beats}/min
CHL CUP STRESS STAGE 5 SBP: 139 mmHg
CHL CUP STRESS STAGE 5 SPEED: 2.5 mph
CHL CUP STRESS STAGE 6 GRADE: 14 %
CHL CUP STRESS STAGE 6 HR: 164 {beats}/min
CHL CUP STRESS STAGE 6 SPEED: 3.4 mph
CHL CUP STRESS STAGE 7 DBP: 56 mmHg
CHL CUP STRESS STAGE 8 GRADE: 0 %
CHL CUP STRESS STAGE 8 HR: 101 {beats}/min
CHL RATE OF PERCEIVED EXERTION: 18
CSEPED: 7 min
CSEPEDS: 0 s
CSEPEW: 8.5 METS
MPHR: 188 {beats}/min
Peak HR: 164 {beats}/min
Percent HR: 88 %
Percent of predicted max HR: 87 %
Rest HR: 84 {beats}/min
Stage 1 DBP: 73 mmHg
Stage 1 SBP: 104 mmHg
Stage 1 Speed: 0 mph
Stage 2 HR: 98 {beats}/min
Stage 3 Grade: 0 %
Stage 3 HR: 96 {beats}/min
Stage 3 Speed: 1 mph
Stage 4 DBP: 60 mmHg
Stage 4 SBP: 137 mmHg
Stage 7 Grade: 0 %
Stage 7 HR: 146 {beats}/min
Stage 7 SBP: 134 mmHg
Stage 7 Speed: 0 mph
Stage 8 DBP: 64 mmHg
Stage 8 SBP: 107 mmHg
Stage 8 Speed: 0 mph

## 2017-01-14 ENCOUNTER — Other Ambulatory Visit: Payer: Self-pay | Admitting: Internal Medicine

## 2017-01-19 DIAGNOSIS — F3181 Bipolar II disorder: Secondary | ICD-10-CM | POA: Diagnosis not present

## 2017-01-21 DIAGNOSIS — F900 Attention-deficit hyperactivity disorder, predominantly inattentive type: Secondary | ICD-10-CM | POA: Diagnosis not present

## 2017-01-22 DIAGNOSIS — R509 Fever, unspecified: Secondary | ICD-10-CM | POA: Diagnosis not present

## 2017-01-22 DIAGNOSIS — R05 Cough: Secondary | ICD-10-CM | POA: Diagnosis not present

## 2017-02-01 NOTE — Progress Notes (Deleted)
Cardiology Office Note Date:  02/01/2017  Patient ID:  Christina, [name] 26-Jan-1986, MRN [id number] PCP:  Daisy Floro, MD  Cardiologist:  Dr.Taylor   Chief Complaint: palpitations  History of Present Illness: Christina [name] is a 31 y.o. female with history of palpitations, noted with and suspect SVT though not documented.  She comes in today to be seen for Dr. Ladona Ridgel, last seen by him in January, at that time on metoprolol, he mentions she has anxiety as well.  She was seen by myself in August, she was concerned that she is still having palpitations, generally about the same as they have been, though never so bad that she has felt need to go to the ER like she did when she was pregnant.  (over a year ago)  She is s/p tubal ligation and not currently pregnant.  She repored feeling like her HR races and startles easily with minimal events.  She  c/o unrelated hot flashes that she feels are unusual though not connected to her palpitations.  She denied SOB, near syncope or syncope with/without the palpitations.  She reported transient chest tightness associated with times of anxiety only, not with her palpitations or with exertional.  She described poor sleep waking 1-2 x nightly to check on the baby or use the bathroom.  She felt initially that  she may be overwhelmed with the kids/works from home so they got a nanny who helps quite a bit but despite this, she feels like nothing has changed with her palpitations/symptoms.  She takes amphetamine for her ADHD and has been on it for 10 years, she does not feel like this is attributing to her symptoms, and is on a lower dose in the last year then previous.  Plan at the end of her visit was given almost daily symptoms, no associated symptoms with the palpitations and different then her "tachycardia that goes 200".  She is encouraged to keep adequately hydrated and exercise. She reports she is scared to exercise that something bad may happen.   Discussed talking with her PMD regarding he ADHD medicine to see if a non-stimulant may be an option though she is reluctant to consider this and does not feel it is related.  She was ordered for a 2 week monitor and ETT.  Her stress test  Was negative, the monitor ***  *** monitor? *** symptoms *** exercise?, hydration, sleep *** ADHD med    Past Medical History:  Diagnosis Date  . Anemia    borderline anemic  . Anxiety   . Depression   . Eczema   . GERD (gastroesophageal reflux disease)    with pregnancy  . History of hip fracture 10/2010   still has pain, right hip  . Hyperemesis arising during pregnancy   . Increased heart rate    resting heart rate 100-120  . Low energy    on bedrest during pregnancy trying to regain strength  . SVD (spontaneous vaginal delivery) 12/16/2015  . SVT (supraventricular tachycardia) (HCC)   . UTI (lower urinary tract infection)   . Vaginitis and vulvovaginitis     Past Surgical History:  Procedure Laterality Date  . LAPAROSCOPIC TUBAL LIGATION Bilateral 02/28/2016   Procedure: LAPAROSCOPIC TUBAL LIGATION;  Surgeon: Lavina Hamman, MD;  Location: [facility];  Service: Gynecology;  Laterality: Bilateral;  . LAPAROSCOPY Left 06/20/2012   Procedure: LAPAROSCOPY OPERATIVE;  Surgeon: Lavina Hamman, MD;  Location: [facility];  Service: Gynecology;  Laterality: Left;  OPEN  LAPAROSCOPY, removal of portion of left ovarian cyst wall  . WISDOM TOOTH EXTRACTION      Current Outpatient Prescriptions  Medication Sig Dispense Refill  . amphetamine-dextroamphetamine (ADDERALL) 20 MG tablet Take 10 mg by mouth daily.    Marland Kitchen LORazepam (ATIVAN) 0.5 MG tablet Take 0.5 mg by mouth 2 (two) times daily.    . metoprolol tartrate (LOPRESSOR) 25 MG tablet TAKE 1 TABLET BY MOUTH TWICE A DAY 60 tablet 6   No current facility-administered medications for this visit.     Allergies:   Sulfa antibiotics and Latex   Social History:  The patient  reports that she has never  smoked. She has never used smokeless tobacco. She reports that she drinks alcohol. She reports that she does not use drugs.   Family History:  The patient's family history includes Alcohol abuse in her brother, maternal aunt, maternal uncle, and mother; Anxiety disorder in her mother; Asthma in her maternal grandfather, maternal grandmother, mother, paternal grandfather, and paternal grandmother; Cancer in her maternal aunt; Crohn's disease in her brother; Depression in her mother; Diabetes in her maternal grandfather and paternal grandfather; Endometriosis in her sister; Heart disease in her paternal grandfather; Hypertension in her father, maternal grandfather, paternal grandfather, and paternal grandmother; Hypothyroidism in her maternal aunt and mother; Mental illness in her maternal grandmother and mother; Miscarriages / [place] in her maternal aunt; Ovarian cysts in her mother and sister.  ROS:  Please see the history of present illness.  All other systems are reviewed and otherwise negative.   PHYSICAL EXAM:  VS:  There were no vitals taken for this visit. BMI: There is no height or weight on file to calculate BMI. Well nourished, well developed, in no acute distress  HEENT: normocephalic, atraumatic  Neck: no JVD, carotid bruits or masses Cardiac:  RRR; no significant murmurs, no rubs, or gallops Lungs:  CTA b/l, no wheezing, rhonchi or rales  Abd: soft, nontender, obese MS: no deformity or atrophy Ext:  no edema  Skin: warm and dry, no rash Neuro:  No gross deficits appreciated Psych: euthymic mood, full affect   EKG:  Done today and reviewed by myself is SR, 74bpm, PR , QRS 82ms, Qtc , no ischemic changes, normal EKG  12/31/16: ETT  Blood pressure demonstrated a normal response to exercise.  There was no ST segment deviation noted during stress.  No T wave inversion was noted during stress.  Negative, adequate stress test.  09/13/15: TTE Study Conclusions -  Left ventricle: The cavity size was normal. Wall thickness was   normal. Systolic function was normal. The estimated ejection   fraction was in the range of 55% to 60%. Wall motion was normal;   there were no regional wall motion abnormalities. Left   ventricular diastolic function parameters were normal.  09/13/15: event monitor Sinus rhythm, sinus tachycardia with occasional PACs, short burst PAT Palpitations correlated with skips from upper chamber (PACs, short burst  PAT) Chest pressure did not correlate with arrhythmia    Recent Labs: 02/19/2016: BUN 15; Creatinine, Ser 0.68; Hemoglobin 11.6; Platelets 274; Potassium 4.2; Sodium 139  No results found for requested labs within last 8760 hours.   CrCl cannot be calculated (Patient's most recent lab result is older than the maximum 21 days allowed.).   Wt Readings from Last 3 Encounters:  12/11/16 213 lb (96.6 kg)  05/15/16 224 lb 9.6 oz (101.9 kg)  02/19/16 230 lb 8 oz (104.6 kg)     Other studies  reviewed: Additional studies/records reviewed today include: summarized above  ASSESSMENT AND PLAN:  1. Palpitations     *** Dr. Ladona Ridgel suspects SVT and ST  2. CP     *** Associated with anxiety, not exertional        Disposition: ***   Current medicines are reviewed at length with the patient today.  The patient did not have any concerns regarding medicines.  Judith Blonder, PA-C 02/01/2017 7:18 AM     [facility] [address] [place] [address] [phone] (office)  [phone] (fax)

## 2017-02-02 ENCOUNTER — Ambulatory Visit: Payer: BLUE CROSS/BLUE SHIELD | Admitting: Physician Assistant

## 2017-02-03 ENCOUNTER — Encounter: Payer: Self-pay | Admitting: Physician Assistant

## 2017-02-03 DIAGNOSIS — F3181 Bipolar II disorder: Secondary | ICD-10-CM | POA: Diagnosis not present

## 2017-02-09 ENCOUNTER — Telehealth: Payer: Self-pay | Admitting: *Deleted

## 2017-02-09 DIAGNOSIS — G5701 Lesion of sciatic nerve, right lower limb: Secondary | ICD-10-CM | POA: Diagnosis not present

## 2017-02-09 DIAGNOSIS — F3181 Bipolar II disorder: Secondary | ICD-10-CM | POA: Diagnosis not present

## 2017-02-09 DIAGNOSIS — J3081 Allergic rhinitis due to animal (cat) (dog) hair and dander: Secondary | ICD-10-CM | POA: Diagnosis not present

## 2017-02-09 NOTE — Telephone Encounter (Signed)
LM ON PT VM.NORMAL RESULTS AND CLINIC NUMBER IN CASE ANY QUESTIONS

## 2017-02-09 NOTE — Telephone Encounter (Signed)
-----   Message from [name], [place] sent at 02/03/2017  9:29 AM EDT ----- Please let the patient know her monitor looked good.  No abnormal heart rhythms or findings of concern.  Thanks Christina [name]

## 2017-02-11 DIAGNOSIS — F900 Attention-deficit hyperactivity disorder, predominantly inattentive type: Secondary | ICD-10-CM | POA: Diagnosis not present

## 2017-02-16 DIAGNOSIS — M25551 Pain in right hip: Secondary | ICD-10-CM | POA: Diagnosis not present

## 2017-02-16 DIAGNOSIS — M79671 Pain in right foot: Secondary | ICD-10-CM | POA: Diagnosis not present

## 2017-02-22 DIAGNOSIS — M79671 Pain in right foot: Secondary | ICD-10-CM | POA: Diagnosis not present

## 2017-02-22 DIAGNOSIS — M25551 Pain in right hip: Secondary | ICD-10-CM | POA: Diagnosis not present

## 2017-02-24 DIAGNOSIS — M25551 Pain in right hip: Secondary | ICD-10-CM | POA: Diagnosis not present

## 2017-02-24 DIAGNOSIS — M79671 Pain in right foot: Secondary | ICD-10-CM | POA: Diagnosis not present

## 2017-03-02 DIAGNOSIS — F3181 Bipolar II disorder: Secondary | ICD-10-CM | POA: Diagnosis not present

## 2017-03-16 DIAGNOSIS — F3181 Bipolar II disorder: Secondary | ICD-10-CM | POA: Diagnosis not present

## 2017-04-01 DIAGNOSIS — F3181 Bipolar II disorder: Secondary | ICD-10-CM | POA: Diagnosis not present

## 2017-04-06 DIAGNOSIS — J069 Acute upper respiratory infection, unspecified: Secondary | ICD-10-CM | POA: Diagnosis not present

## 2017-04-15 DIAGNOSIS — F900 Attention-deficit hyperactivity disorder, predominantly inattentive type: Secondary | ICD-10-CM | POA: Diagnosis not present

## 2017-05-12 DIAGNOSIS — R509 Fever, unspecified: Secondary | ICD-10-CM | POA: Diagnosis not present

## 2017-05-12 DIAGNOSIS — J020 Streptococcal pharyngitis: Secondary | ICD-10-CM | POA: Diagnosis not present

## 2017-05-28 DIAGNOSIS — F3181 Bipolar II disorder: Secondary | ICD-10-CM | POA: Diagnosis not present

## 2017-06-28 DIAGNOSIS — F3181 Bipolar II disorder: Secondary | ICD-10-CM | POA: Diagnosis not present

## 2017-07-11 DIAGNOSIS — R509 Fever, unspecified: Secondary | ICD-10-CM | POA: Diagnosis not present

## 2017-07-11 DIAGNOSIS — J101 Influenza due to other identified influenza virus with other respiratory manifestations: Secondary | ICD-10-CM | POA: Diagnosis not present

## 2017-07-19 DIAGNOSIS — H66001 Acute suppurative otitis media without spontaneous rupture of ear drum, right ear: Secondary | ICD-10-CM | POA: Diagnosis not present

## 2017-07-25 DIAGNOSIS — H9201 Otalgia, right ear: Secondary | ICD-10-CM | POA: Diagnosis not present

## 2017-07-27 DIAGNOSIS — H6981 Other specified disorders of Eustachian tube, right ear: Secondary | ICD-10-CM | POA: Diagnosis not present

## 2017-07-27 DIAGNOSIS — F900 Attention-deficit hyperactivity disorder, predominantly inattentive type: Secondary | ICD-10-CM | POA: Diagnosis not present

## 2017-07-27 DIAGNOSIS — F411 Generalized anxiety disorder: Secondary | ICD-10-CM | POA: Diagnosis not present

## 2017-07-27 DIAGNOSIS — F3181 Bipolar II disorder: Secondary | ICD-10-CM | POA: Diagnosis not present

## 2017-07-27 DIAGNOSIS — H9011 Conductive hearing loss, unilateral, right ear, with unrestricted hearing on the contralateral side: Secondary | ICD-10-CM | POA: Diagnosis not present

## 2017-07-27 DIAGNOSIS — H66014 Acute suppurative otitis media with spontaneous rupture of ear drum, recurrent, right ear: Secondary | ICD-10-CM | POA: Diagnosis not present

## 2017-07-29 DIAGNOSIS — F3181 Bipolar II disorder: Secondary | ICD-10-CM | POA: Diagnosis not present

## 2017-08-03 DIAGNOSIS — H6981 Other specified disorders of Eustachian tube, right ear: Secondary | ICD-10-CM | POA: Diagnosis not present

## 2017-08-03 DIAGNOSIS — H6521 Chronic serous otitis media, right ear: Secondary | ICD-10-CM | POA: Diagnosis not present

## 2017-08-03 DIAGNOSIS — H9011 Conductive hearing loss, unilateral, right ear, with unrestricted hearing on the contralateral side: Secondary | ICD-10-CM | POA: Diagnosis not present

## 2017-08-06 DIAGNOSIS — J029 Acute pharyngitis, unspecified: Secondary | ICD-10-CM | POA: Diagnosis not present

## 2017-08-06 DIAGNOSIS — J069 Acute upper respiratory infection, unspecified: Secondary | ICD-10-CM | POA: Diagnosis not present

## 2017-08-10 DIAGNOSIS — F3181 Bipolar II disorder: Secondary | ICD-10-CM | POA: Diagnosis not present

## 2017-08-17 ENCOUNTER — Other Ambulatory Visit: Payer: Self-pay | Admitting: Internal Medicine

## 2017-09-15 DIAGNOSIS — L0390 Cellulitis, unspecified: Secondary | ICD-10-CM | POA: Diagnosis not present

## 2017-10-18 DIAGNOSIS — F900 Attention-deficit hyperactivity disorder, predominantly inattentive type: Secondary | ICD-10-CM | POA: Diagnosis not present

## 2017-10-18 DIAGNOSIS — F411 Generalized anxiety disorder: Secondary | ICD-10-CM | POA: Diagnosis not present

## 2017-10-21 DIAGNOSIS — F3181 Bipolar II disorder: Secondary | ICD-10-CM | POA: Diagnosis not present

## 2017-11-16 DIAGNOSIS — F332 Major depressive disorder, recurrent severe without psychotic features: Secondary | ICD-10-CM | POA: Diagnosis not present

## 2017-11-16 DIAGNOSIS — F900 Attention-deficit hyperactivity disorder, predominantly inattentive type: Secondary | ICD-10-CM | POA: Diagnosis not present

## 2017-11-16 DIAGNOSIS — F411 Generalized anxiety disorder: Secondary | ICD-10-CM | POA: Diagnosis not present

## 2018-01-23 ENCOUNTER — Other Ambulatory Visit: Payer: Self-pay | Admitting: Internal Medicine

## 2018-02-13 ENCOUNTER — Emergency Department (HOSPITAL_COMMUNITY)
Admission: EM | Admit: 2018-02-13 | Discharge: 2018-02-13 | Disposition: A | Discharge status: Home / Self Care | Payer: BLUE CROSS/BLUE SHIELD | Attending: Emergency Medicine | Admitting: Emergency Medicine

## 2018-02-13 ENCOUNTER — Encounter (HOSPITAL_COMMUNITY): Payer: Self-pay | Admitting: Emergency Medicine

## 2018-02-13 ENCOUNTER — Other Ambulatory Visit: Payer: Self-pay

## 2018-02-13 ENCOUNTER — Emergency Department (HOSPITAL_COMMUNITY): Payer: BLUE CROSS/BLUE SHIELD

## 2018-02-13 DIAGNOSIS — S299XXA Unspecified injury of thorax, initial encounter: Secondary | ICD-10-CM | POA: Diagnosis not present

## 2018-02-13 DIAGNOSIS — Z91040 Latex allergy status: Secondary | ICD-10-CM | POA: Diagnosis not present

## 2018-02-13 DIAGNOSIS — M546 Pain in thoracic spine: Secondary | ICD-10-CM | POA: Insufficient documentation

## 2018-02-13 DIAGNOSIS — Z79899 Other long term (current) drug therapy: Secondary | ICD-10-CM | POA: Diagnosis not present

## 2018-02-13 MED ORDER — HYDROCODONE-ACETAMINOPHEN 5-325 MG PO TABS: 1.0000 | ORAL_TABLET | ORAL | 0 refills | Status: DC | PRN

## 2018-02-13 MED ORDER — KETOROLAC TROMETHAMINE 60 MG/2ML IM SOLN
30.0000 mg | Freq: Once | INTRAMUSCULAR | Status: AC
Start: 2018-02-13 — End: 2018-02-13
  Administered 2018-02-13: 30 mg via INTRAMUSCULAR
  Filled 2018-02-13: qty 2

## 2018-02-13 MED ORDER — ONDANSETRON 4 MG PO TBDP
8.0000 mg | ORAL_TABLET | Freq: Once | ORAL | Status: AC
  Administered 2018-02-13: 8 mg via ORAL
  Filled 2018-02-13: qty 2

## 2018-02-13 MED ORDER — LORAZEPAM 2 MG/ML IJ SOLN
2.0000 mg | Freq: Once | INTRAMUSCULAR | Status: AC
  Administered 2018-02-13: 2 mg via INTRAMUSCULAR
  Filled 2018-02-13: qty 1

## 2018-02-13 MED ORDER — DIAZEPAM 10 MG PO TABS: 10.0000 mg | ORAL_TABLET | Freq: Three times a day (TID) | ORAL | 0 refills | Status: DC | PRN

## 2018-02-13 MED ORDER — HYDROMORPHONE HCL 1 MG/ML IJ SOLN
1.0000 mg | Freq: Once | INTRAMUSCULAR | Status: AC
  Administered 2018-02-13: 1 mg via INTRAMUSCULAR
  Filled 2018-02-13: qty 1

## 2018-02-13 NOTE — ED Notes (Signed)
Patient verbalizes understanding of discharge instructions. Opportunity for questioning and answers were provided. Armband removed by staff, pt discharged from ED.  

## 2018-02-13 NOTE — ED Notes (Signed)
Patient transported to X-ray 

## 2018-02-13 NOTE — ED Triage Notes (Signed)
Pt states she was lying in bed stretching and head a "pop" then she could not move for about 1 minute.  She is now in pain "right between my shoulder blades.".  Pt reports that the pain goes away when she breaths in but is a 10/10 when she breaths in or stands up.

## 2018-02-13 NOTE — ED Provider Notes (Signed)
[facility] EMERGENCY DEPARTMENT Provider Note   CSN: [id number] Arrival date & time: 02/13/18  6962     History   Chief Complaint Chief Complaint  Patient presents with  . Back Pain    HPI CARAH BARRIENTES is a 32 y.o. female.  HPI  She was lying in bed this morning when she stretched, feeling a pop in her right mid thorax region, which caused severe pain.  The pain is only improved when she leans forward and moves to the left.  The pain is intolerable.  She did not take anything for it.  No prior similar problems.  No other known trauma.  She denies recent illnesses including weight loss, fever, chills, cough, shortness of breath, weakness or dizziness.  There are no other known modifying factors.   Past Medical History:  Diagnosis Date  . Anemia    borderline anemic  . Anxiety   . Depression   . Eczema   . GERD (gastroesophageal reflux disease)    with pregnancy  . History of hip fracture 10/2010   still has pain, right hip  . Hyperemesis arising during pregnancy   . Increased heart rate    resting heart rate 100-120  . Low energy    on bedrest during pregnancy trying to regain strength  . SVD (spontaneous vaginal delivery) 12/16/2015  . SVT (supraventricular tachycardia) (HCC)   . UTI (lower urinary tract infection)   . Vaginitis and vulvovaginitis     Patient Active Problem List   Diagnosis Date Noted  . SVD (spontaneous vaginal delivery) 12/16/2015  . Active labor at term 12/15/2015  . Normal pregnancy in multigravida in third trimester 12/15/2015  . Other and unspecified ovarian cyst 06/20/2012  . Normal pregnancy 08/31/2011    Past Surgical History:  Procedure Laterality Date  . LAPAROSCOPIC TUBAL LIGATION Bilateral 02/28/2016   Procedure: LAPAROSCOPIC TUBAL LIGATION;  Surgeon: Lavina Hamman, MD;  Location: [facility];  Service: Gynecology;  Laterality: Bilateral;  . LAPAROSCOPY Left 06/20/2012   Procedure: LAPAROSCOPY OPERATIVE;   Surgeon: Lavina Hamman, MD;  Location: [facility];  Service: Gynecology;  Laterality: Left;  OPEN LAPAROSCOPY, removal of portion of left ovarian cyst wall  . WISDOM TOOTH EXTRACTION       OB History    Gravida  3   Para  3   Term  3   Preterm  0   AB  0   Living  3     SAB  0   TAB  0   Ectopic  0   Multiple  0   Live Births  3            Home Medications    Prior to Admission medications   Medication Sig Start Date End Date Taking? Authorizing Provider  amphetamine-dextroamphetamine (ADDERALL) 20 MG tablet Take 10 mg by mouth daily.   Yes [provider]  Biotin w/ Vitamins C & E (HAIR/SKIN/NAILS PO) Take 1 tablet by mouth daily.   Yes [provider]  LORazepam (ATIVAN) 0.5 MG tablet Take 0.5 mg by mouth 4 (four) times daily as needed for anxiety.    Yes [provider]  metoprolol tartrate (LOPRESSOR) 25 MG tablet Take 1 tablet (25 mg total) by mouth 2 (two) times daily. Please make overdue appt with Dr Ladona Ridgel before anymore refills. 1st attempt 01/24/18  Yes Marinus Maw, MD  Multiple Vitamin (MULTIVITAMIN WITH MINERALS) TABS tablet Take 1 tablet by mouth daily.  Yes [provider]  omega-3 acid ethyl esters (LOVAZA) 1 g capsule Take 1 g by mouth daily.   Yes [provider]  diazepam (VALIUM) 10 MG tablet Take 1 tablet (10 mg total) by mouth every 8 (eight) hours as needed (Muscle spasm). 02/13/18   Mancel Bale, MD  HYDROcodone-acetaminophen (NORCO) 5-325 MG tablet Take 1-2 tablets by mouth every 4 (four) hours as needed for moderate pain. 02/13/18   Mancel Bale, MD    Family History Family History  Problem Relation Age of Onset  . Alcohol abuse Mother   . Anxiety disorder Mother   . Asthma Mother   . Mental illness Mother        bipolar  . Depression Mother   . Hypothyroidism Mother   . Ovarian cysts Mother   . Hypertension Father   . Endometriosis Sister   . Ovarian cysts Sister   . Alcohol abuse  Brother   . Crohn's disease Brother   . Alcohol abuse Maternal Aunt   . Cancer Maternal Aunt        breast  . Hypothyroidism Maternal Aunt   . Miscarriages / Stillbirths Maternal Aunt        stillbirth  . Alcohol abuse Maternal Uncle   . Asthma Maternal Grandmother   . Mental illness Maternal Grandmother        bipolar  . Asthma Maternal Grandfather   . Diabetes Maternal Grandfather   . Hypertension Maternal Grandfather   . Asthma Paternal Grandmother   . Hypertension Paternal Grandmother   . Asthma Paternal Grandfather   . Diabetes Paternal Grandfather   . Heart disease Paternal Grandfather   . Hypertension Paternal Grandfather     Social History Social History   Tobacco Use  . Smoking status: Never Smoker  . Smokeless tobacco: Never Used  Substance Use Topics  . Alcohol use: Yes    Comment: occ  . Drug use: No     Allergies   Sulfa antibiotics and Latex   Review of Systems Review of Systems  All other systems reviewed and are negative.    Physical Exam Updated Vital Signs BP 115/65   Pulse 72   Temp 98.6 F (37 C) (Oral)   Resp 16   Ht 5\' 7"  (1.702 m)   Wt 97.5 kg   SpO2 96%   BMI 33.67 kg/m   Physical Exam  Constitutional: She is oriented to person, place, and time. She appears well-developed and well-nourished. No distress.  HENT:  Head: Normocephalic and atraumatic.  Eyes: Pupils are equal, round, and reactive to light. Conjunctivae and EOM are normal.  Neck: Normal range of motion and phonation normal. Neck supple.  Cardiovascular: Normal rate.  Pulmonary/Chest: Effort normal. She exhibits no tenderness.  Musculoskeletal:  Guards against movement of the thorax.  Mild tenderness right mid thorax paravertebral region.  No associated deformity or crepitation.  Neurological: She is alert and oriented to person, place, and time. She exhibits normal muscle tone.  Skin: Skin is warm and dry.  Psychiatric: She has a normal mood and affect. Her  behavior is normal. Judgment and thought content normal.  Nursing note and vitals reviewed.    ED Treatments / Results  Labs (all labs ordered are listed, but only abnormal results are displayed) Labs Reviewed - No data to display  EKG None  Radiology Dg Thoracic Spine 2 View  Result Date: 02/13/2018 CLINICAL DATA:  Patient claims waking up at 4 am this morning and stretching and  felt and heard a pop in her middle thoracic spine. Says its hurts worse when she lays flat or on her back. No previous injury or surgery to area. EXAM: THORACIC SPINE 2 VIEWS COMPARISON:  Chest radiograph 10/20/2014 FINDINGS: Normal paraspinous contours. Normal cervicothoracic junction on swimmer's view. Lateral view images approximately T3 through L2. Maintenance of vertebral body height and alignment across these levels. Intervertebral disc heights are maintained. IMPRESSION: No acute osseous abnormality. Electronically Signed   By: Jeronimo Greaves M.D.   On: 02/13/2018 08:15    Procedures Procedures (including critical care time)  Medications Ordered in ED Medications  ketorolac (TORADOL) injection 30 mg (30 mg Intramuscular Given 02/13/18 0755)  HYDROmorphone (DILAUDID) injection 1 mg (1 mg Intramuscular Given 02/13/18 0756)  LORazepam (ATIVAN) injection 2 mg (2 mg Intramuscular Given 02/13/18 0754)  ondansetron (ZOFRAN-ODT) disintegrating tablet 8 mg (8 mg Oral Given 02/13/18 1610)     Initial Impression / Assessment and Plan / ED Course  I have reviewed the triage vital signs and the nursing notes.  Pertinent labs & imaging results that were available during my care of the patient were reviewed by me and considered in my medical decision making (see chart for details).      Patient Vitals for the past 24 hrs:  BP Temp Temp src Pulse Resp SpO2 Height Weight  02/13/18 1000 115/65 - - 72 - 96 % - -  02/13/18 0900 102/66 - - 60 - 98 % - -  02/13/18 0810 - - - 72 - 98 % - -  02/13/18 0809 110/62 -  - - - - - -  02/13/18 0558 133/84 98.6 F (37 C) Oral 87 16 100 % 5\' 7"  (1.702 m) 97.5 kg    At D/C Reevaluation with update and discussion. After initial assessment and treatment, an updated evaluation reveals paon is better. No further c/o. Findings discussed and questions answered.Mancel Bale   Medical Decision Making: Likely muscle strain. Doubt fracture or radiculopathy/  CRITICAL CARE- No Performed by: Mancel Bale   Nursing Notes Reviewed/ Care Coordinated Applicable Imaging Reviewed Interpretation of Laboratory Data incorporated into ED treatment  The patient appears reasonably screened and/or stabilized for discharge and I doubt any other medical condition or other [facility] requiring further screening, evaluation, or treatment in the ED at this time prior to discharge.  Plan: Home Medications- continue usuual; Home Treatments- rest, cryotherapy; return here if the recommended treatment, does not improve the symptoms; Recommended follow up- PCP prn     Final Clinical Impressions(s) / ED Diagnoses   Final diagnoses:  Acute right-sided thoracic back pain    ED Discharge Orders         Ordered    diazepam (VALIUM) 10 MG tablet  Every 8 hours PRN     02/13/18 1047    HYDROcodone-acetaminophen (NORCO) 5-325 MG tablet  Every 4 hours PRN     02/13/18 1047           Mancel Bale, MD 02/13/18 2203

## 2018-02-13 NOTE — Discharge Instructions (Addendum)
Rest as much as possible for a few days then gradually increase your activity.  Use ice to sore areas four or 5 times a day for 2 days after that use heat.  Additionally, for pain, you can use ibuprofen 400 mg 3 times a day, with meals.

## 2018-02-13 NOTE — ED Notes (Signed)
Pt ambulated to wheelchair with minor difficulty.

## 2018-02-15 DIAGNOSIS — F411 Generalized anxiety disorder: Secondary | ICD-10-CM | POA: Diagnosis not present

## 2018-02-15 DIAGNOSIS — F900 Attention-deficit hyperactivity disorder, predominantly inattentive type: Secondary | ICD-10-CM | POA: Diagnosis not present

## 2018-02-15 DIAGNOSIS — F3181 Bipolar II disorder: Secondary | ICD-10-CM | POA: Diagnosis not present

## 2018-02-28 ENCOUNTER — Other Ambulatory Visit: Payer: Self-pay | Admitting: Internal Medicine

## 2018-03-17 ENCOUNTER — Telehealth: Payer: Self-pay | Admitting: Physician Assistant

## 2018-03-17 ENCOUNTER — Other Ambulatory Visit: Payer: Self-pay | Admitting: *Deleted

## 2018-03-17 MED ORDER — METOPROLOL TARTRATE 25 MG PO TABS: 25.0000 mg | ORAL_TABLET | Freq: Two times a day (BID) | ORAL | 1 refills | Status: DC

## 2018-03-17 NOTE — Telephone Encounter (Signed)
° ° ° °*  STAT* If patient is at the pharmacy, call can be transferred to refill team.   1. Which medications need to be refilled? (please list name of each medication and dose if known) metoprolol tartrate (LOPRESSOR) 25 MG tablet  2. Which pharmacy/location (including street and city if local pharmacy) is medication to be sent to?[id number] [place]- [place], [place] - [address]  3. Do they need a 30 day or 90 day supply? 30

## 2018-04-13 ENCOUNTER — Encounter: Payer: Self-pay | Admitting: Physician Assistant

## 2018-04-19 NOTE — Progress Notes (Signed)
Cardiology Office Note Date:  04/19/2018  Patient ID:  Electra, Paladino 04-14-1986, MRN [id number] PCP:  Daisy Floro, MD  Cardiologist:  Dr.Taylor   Chief Complaint: med refill  History of Present Illness: NISSI DOFFING is a 32 y.o. female with history of palpitations, noted with and suspect SVT though not documented.  She comes in today to be seen for Dr. Ladona Ridgel, last seen by him in January 2018, at that time on metoprolol, he mentions she has anxiety as well.  I saw her in f/u Aug 2018, she reported that she is still having palpitations, generally about the same as they have been, though never so bad that she has felt need to go to the ER like she did when she was pregnant.  (over a year ago)  She is s/p tubal ligation not currently pregnant.  She reported feeling like her HR races and startles easily with minimal events.  She had unrelated hot flashes that she feels are unusual though not connected to her palpitations.  She denisSOB, near syncope or syncope with/without the palpitations.  She reported transient chest tightness associated with times of anxiety only, not with her palpitations or with exertion.  She was sleeping poorly waking 1-2 x nightly to check on the baby or use the bathroom.  She reports that she felt she may be overwhelmed with the kids/works from home so they got a nanny who helps quite a bit but despite this, she feels like nothing has changed with her palpitations/symptoms.  She was take amphetamine for her ADHD and has been on it for 10 years, she doid not feel like this was conributing to her symptoms, and is on a lower dose in the last year then previous.  Recommended a 2 week monitor and ETT to further evaluate her symptoms, both completed and normal.  She is doing much better.  She was amazed how much hydration played in her symptoms, and has been better with this and continues to try and make exercise a regular thing in her very busy life.  She is in  grad school for adolescent psychology, looking towards a PHD.  Has  A 33 year old and 5 other children (between her and her husband's prior marriages).  All of this though she mentions has been able to gain coping and time management skills and this has helped her very much as well.  She has noticed her HR gets up whe she has eaten sweets, and at times at the end of her crazier days she will feel her HR fast/some weakness, usually days that she has not eaten much if at all.  She also thinks she notices her HR up and realizes her evening dose of metoprolol is due.  No CP, no near syncope or syncope.   Past Medical History:  Diagnosis Date  . Anemia    borderline anemic  . Anxiety   . Depression   . Eczema   . GERD (gastroesophageal reflux disease)    with pregnancy  . History of hip fracture 10/2010   still has pain, right hip  . Hyperemesis arising during pregnancy   . Increased heart rate    resting heart rate 100-120  . Low energy    on bedrest during pregnancy trying to regain strength  . SVD (spontaneous vaginal delivery) 12/16/2015  . SVT (supraventricular tachycardia) (HCC)   . UTI (lower urinary tract infection)   . Vaginitis and vulvovaginitis  Past Surgical History:  Procedure Laterality Date  . LAPAROSCOPIC TUBAL LIGATION Bilateral 02/28/2016   Procedure: LAPAROSCOPIC TUBAL LIGATION;  Surgeon: Lavina Hammanodd Meisinger, MD;  Location: [facility];  Service: Gynecology;  Laterality: Bilateral;  . LAPAROSCOPY Left 06/20/2012   Procedure: LAPAROSCOPY OPERATIVE;  Surgeon: Lavina Hammanodd Meisinger, MD;  Location: [facility];  Service: Gynecology;  Laterality: Left;  OPEN LAPAROSCOPY, removal of portion of left ovarian cyst wall  . WISDOM TOOTH EXTRACTION      Current Outpatient Medications  Medication Sig Dispense Refill  . amphetamine-dextroamphetamine (ADDERALL) 20 MG tablet Take 10 mg by mouth daily.    . Biotin w/ Vitamins C & E (HAIR/SKIN/NAILS PO) Take 1 tablet by mouth daily.    . diazepam  (VALIUM) 10 MG tablet Take 1 tablet (10 mg total) by mouth every 8 (eight) hours as needed (Muscle spasm). 20 tablet 0  . HYDROcodone-acetaminophen (NORCO) 5-325 MG tablet Take 1-2 tablets by mouth every 4 (four) hours as needed for moderate pain. 20 tablet 0  . LORazepam (ATIVAN) 0.5 MG tablet Take 0.5 mg by mouth 4 (four) times daily as needed for anxiety.     . metoprolol tartrate (LOPRESSOR) 25 MG tablet Take 1 tablet (25 mg total) by mouth 2 (two) times daily. Patient must keep 04/21/18 appointment for further refills 60 tablet 1  . Multiple Vitamin (MULTIVITAMIN WITH MINERALS) TABS tablet Take 1 tablet by mouth daily.    Marland Kitchen. omega-3 acid ethyl esters (LOVAZA) 1 g capsule Take 1 g by mouth daily.     No current facility-administered medications for this visit.     Allergies:   Sulfa antibiotics and Latex   Social History:  The patient  reports that she has never smoked. She has never used smokeless tobacco. She reports current alcohol use. She reports that she does not use drugs.   Family History:  The patient's family history includes Alcohol abuse in her brother, maternal aunt, maternal uncle, and mother; Anxiety disorder in her mother; Asthma in her maternal grandfather, maternal grandmother, mother, paternal grandfather, and paternal grandmother; Cancer in her maternal aunt; Crohn's disease in her brother; Depression in her mother; Diabetes in her maternal grandfather and paternal grandfather; Endometriosis in her sister; Heart disease in her paternal grandfather; Hypertension in her father, maternal grandfather, paternal grandfather, and paternal grandmother; Hypothyroidism in her maternal aunt and mother; Mental illness in her maternal grandmother and mother; Miscarriages / [place] in her maternal aunt; Ovarian cysts in her mother and sister.  ROS:  Please see the history of present illness.  All other systems are reviewed and otherwise negative.   PHYSICAL EXAM:  VS:  There were no  vitals taken for this visit. BMI: There is no height or weight on file to calculate BMI. Well nourished, well developed, in no acute distress  HEENT: normocephalic, atraumatic  Neck: no JVD, carotid bruits or masses Cardiac:  RRR; no significant murmurs, no rubs, or gallops Lungs:  CTA b/l, no wheezing, rhonchi or rales  Abd: soft, nontender, obese MS: no deformity or atrophy Ext:  no edema  Skin: warm and dry, no rash Neuro:  No gross deficits appreciated Psych: euthymic mood, full affect   EKG:  Done today and reviewed by myself:    12/31/16: EST  Blood pressure demonstrated a normal response to exercise.  There was no ST segment deviation noted during stress.  No T wave inversion was noted during stress.  Negative, adequate stress test.  Aug 2018,  2 week monitor 1.  NSR with sinus tachycardia 2. Rare PVC's and PAC's. 3. No non-sustained or sustained atrial or ventricular arrhythmias 4. No prolonged pauses  09/13/15: TTE Study Conclusions - Left ventricle: The cavity size was normal. Wall thickness was   normal. Systolic function was normal. The estimated ejection   fraction was in the range of 55% to 60%. Wall motion was normal;   there were no regional wall motion abnormalities. Left   ventricular diastolic function parameters were normal.  09/13/15: event monitor Sinus rhythm, sinus tachycardia with occasional PACs, short burst PAT Palpitations correlated with skips from upper chamber (PACs, short burst  PAT) Chest pressure did not correlate with arrhythmia    Recent Labs: No results found for requested labs within last 8760 hours.  No results found for requested labs within last 8760 hours.   CrCl cannot be calculated (Patient's most recent lab result is older than the maximum 21 days allowed.).   Wt Readings from Last 3 Encounters:  02/13/18 215 lb (97.5 kg)  12/11/16 213 lb (96.6 kg)  05/15/16 224 lb 9.6 oz (101.9 kg)     Other studies  reviewed: Additional studies/records reviewed today include: summarized above  ASSESSMENT AND PLAN:  1. Palpitations     Dr. Ladona Ridgel suspects SVT and ST     Much improved  Re-enforced hydration, exercise, adequate sleep and healthy diet, she is very motivated and doing much better       Disposition: we will continue to see her annually, sooner if needed.  Current medicines are reviewed at length with the patient today.  The patient did not have any concerns regarding medicines.  Judith Blonder, PA-C 04/19/2018 7:33 PM     [facility] [address] [place] [address] [phone] (office)  [phone] (fax)

## 2018-04-21 ENCOUNTER — Encounter (INDEPENDENT_AMBULATORY_CARE_PROVIDER_SITE_OTHER): Payer: Self-pay

## 2018-04-21 ENCOUNTER — Ambulatory Visit: Payer: BLUE CROSS/BLUE SHIELD | Admitting: Physician Assistant

## 2018-04-21 VITALS — BP 110/68 | HR 85 | Ht 67.0 in | Wt 224.0 lb

## 2018-04-21 DIAGNOSIS — R002 Palpitations: Secondary | ICD-10-CM

## 2018-04-21 DIAGNOSIS — I471 Supraventricular tachycardia: Secondary | ICD-10-CM | POA: Diagnosis not present

## 2018-04-21 MED ORDER — METOPROLOL TARTRATE 25 MG PO TABS: 25.0000 mg | ORAL_TABLET | Freq: Two times a day (BID) | ORAL | 4 refills | Status: DC

## 2018-04-21 NOTE — Patient Instructions (Signed)
Medication Instructions:   Your physician recommends that you continue on your current medications as directed. Please refer to the Current Medication list given to you today.  If you need a refill on your cardiac medications before your next appointment, please call your pharmacy.   Lab work:  NONE ORDERED  TODAY  If you have labs (blood work) drawn today and your tests are completely normal, you will receive your results only by: Marland Kitchen. MyChart Message (if you have MyChart) OR . A paper copy in the mail If you have any lab test that is abnormal or we need to change your treatment, we will call you to review the results.  Testing/Procedures:  NONE ORDERED  TODAY   Follow-Up:  At [facility], you and your health needs are our priority.  As part of our continuing mission to provide you with exceptional heart care, we have created designated Provider Care Teams.  These Care Teams include your primary Cardiologist (physician) and Advanced Practice Providers (APPs -  Physician Assistants and Nurse Practitioners) who all work together to provide you with the care you need, when you need it. You will need a follow up appointment in 1 years.  Please call our office 2 months in advance to schedule this appointment.  You may see Lewayne BuntingGregg Taylor, MD or one of the following Advanced Practice Providers on your designated Care Team:   Gypsy BalsamAmber Seiler, NP . Francis Dowseenee Ursuy, PA-C  Any Other Special Instructions Will Be Listed Below (If Applicable).

## 2018-04-24 DIAGNOSIS — J029 Acute pharyngitis, unspecified: Secondary | ICD-10-CM | POA: Diagnosis not present

## 2018-05-01 DIAGNOSIS — H10023 Other mucopurulent conjunctivitis, bilateral: Secondary | ICD-10-CM | POA: Diagnosis not present

## 2018-05-17 DIAGNOSIS — F411 Generalized anxiety disorder: Secondary | ICD-10-CM | POA: Diagnosis not present

## 2018-05-17 DIAGNOSIS — F900 Attention-deficit hyperactivity disorder, predominantly inattentive type: Secondary | ICD-10-CM | POA: Diagnosis not present

## 2018-07-12 DIAGNOSIS — M79641 Pain in right hand: Secondary | ICD-10-CM | POA: Diagnosis not present

## 2018-07-29 ENCOUNTER — Other Ambulatory Visit: Payer: Self-pay

## 2018-07-29 ENCOUNTER — Telehealth: Payer: Self-pay | Admitting: Internal Medicine

## 2018-07-29 MED ORDER — METOPROLOL TARTRATE 25 MG PO TABS: 25.0000 mg | ORAL_TABLET | Freq: Two times a day (BID) | ORAL | 3 refills | Status: DC

## 2018-07-29 NOTE — Telephone Encounter (Signed)
°*  STAT* If patient is at the pharmacy, call can be transferred to refill team.   1. Which medications need to be refilled? (please list name of each medication and dose if known)  metoprolol tartrate (LOPRESSOR) 25 MG tablet  2. Which pharmacy/location (including street and city if local pharmacy) is medication to be sent to?   [id number] [place], [place] - [address]    3. Do they need a 30 day or 90 day supply? 30  Pt says she lost her medicine. Her and her husband have looked all over the house, but can not find the bottle. The pharmacy can not refill her regular rx until April 10th.  She is out of medication.

## 2018-09-01 ENCOUNTER — Telehealth: Payer: Self-pay | Admitting: Internal Medicine

## 2018-09-01 DIAGNOSIS — R42 Dizziness and giddiness: Secondary | ICD-10-CM | POA: Diagnosis not present

## 2018-09-01 DIAGNOSIS — R000 Tachycardia, unspecified: Secondary | ICD-10-CM | POA: Diagnosis not present

## 2018-09-01 NOTE — Telephone Encounter (Signed)
Pt stated BP has come back to normal after lunch.  Advised if she is having low blood pressures in the  Morning she can take 1/2 of her lopressor, and then take a full dose at night.  Advised if that didn't help to even out her blood pressure to call back.

## 2018-09-01 NOTE — Telephone Encounter (Signed)
° °  Pt c/o BP issue: STAT if pt c/o blurred vision, one-sided weakness or slurred speech  1. What are your last 5 BP readings? 103/60  2. Are you having any other symptoms (ex. Dizziness, headache, blurred vision, passed out)? Lightheaded  3. What is your BP issue? Patient states she feels weak, due to low BP

## 2018-09-05 DIAGNOSIS — F3181 Bipolar II disorder: Secondary | ICD-10-CM | POA: Diagnosis not present

## 2018-09-05 DIAGNOSIS — F900 Attention-deficit hyperactivity disorder, predominantly inattentive type: Secondary | ICD-10-CM | POA: Diagnosis not present

## 2018-09-05 DIAGNOSIS — F411 Generalized anxiety disorder: Secondary | ICD-10-CM | POA: Diagnosis not present

## 2018-09-09 ENCOUNTER — Telehealth: Payer: Self-pay | Admitting: Internal Medicine

## 2018-09-09 NOTE — Telephone Encounter (Signed)
New Message:     Patient calling concering some medication changes. Please call patient.

## 2018-09-12 ENCOUNTER — Telehealth: Payer: Self-pay

## 2018-09-12 NOTE — Telephone Encounter (Signed)
error 

## 2018-09-12 NOTE — Telephone Encounter (Signed)
LMTCB

## 2018-09-23 ENCOUNTER — Telehealth: Payer: Self-pay

## 2018-09-23 NOTE — Telephone Encounter (Signed)
Did not receive call back.  Will await further needs.

## 2018-10-04 ENCOUNTER — Emergency Department (HOSPITAL_COMMUNITY): Payer: BC Managed Care – PPO

## 2018-10-04 ENCOUNTER — Emergency Department (HOSPITAL_COMMUNITY)
Admission: EM | Admit: 2018-10-04 | Discharge: 2018-10-04 | Disposition: A | Discharge status: Home / Self Care | Payer: BC Managed Care – PPO | Attending: Emergency Medicine | Admitting: Emergency Medicine

## 2018-10-04 DIAGNOSIS — S6992XA Unspecified injury of left wrist, hand and finger(s), initial encounter: Secondary | ICD-10-CM | POA: Diagnosis not present

## 2018-10-04 DIAGNOSIS — Y9301 Activity, walking, marching and hiking: Secondary | ICD-10-CM | POA: Insufficient documentation

## 2018-10-04 DIAGNOSIS — R1012 Left upper quadrant pain: Secondary | ICD-10-CM | POA: Diagnosis not present

## 2018-10-04 DIAGNOSIS — R079 Chest pain, unspecified: Secondary | ICD-10-CM | POA: Diagnosis not present

## 2018-10-04 DIAGNOSIS — W010XXA Fall on same level from slipping, tripping and stumbling without subsequent striking against object, initial encounter: Secondary | ICD-10-CM | POA: Insufficient documentation

## 2018-10-04 DIAGNOSIS — Y998 Other external cause status: Secondary | ICD-10-CM | POA: Insufficient documentation

## 2018-10-04 DIAGNOSIS — M25532 Pain in left wrist: Secondary | ICD-10-CM | POA: Diagnosis not present

## 2018-10-04 DIAGNOSIS — Z79899 Other long term (current) drug therapy: Secondary | ICD-10-CM | POA: Diagnosis not present

## 2018-10-04 DIAGNOSIS — Y92017 Garden or yard in single-family (private) house as the place of occurrence of the external cause: Secondary | ICD-10-CM | POA: Diagnosis not present

## 2018-10-04 DIAGNOSIS — M25531 Pain in right wrist: Secondary | ICD-10-CM

## 2018-10-04 DIAGNOSIS — W19XXXA Unspecified fall, initial encounter: Secondary | ICD-10-CM

## 2018-10-04 DIAGNOSIS — S3991XA Unspecified injury of abdomen, initial encounter: Secondary | ICD-10-CM | POA: Diagnosis not present

## 2018-10-04 DIAGNOSIS — S6991XA Unspecified injury of right wrist, hand and finger(s), initial encounter: Secondary | ICD-10-CM | POA: Diagnosis not present

## 2018-10-04 DIAGNOSIS — S299XXA Unspecified injury of thorax, initial encounter: Secondary | ICD-10-CM | POA: Diagnosis not present

## 2018-10-04 LAB — CBC WITH DIFFERENTIAL/PLATELET
Abs Immature Granulocytes: 0.06 10*3/uL (ref 0.00–0.07)
Basophils Absolute: 0.1 10*3/uL (ref 0.0–0.1)
Basophils Relative: 1 %
Eosinophils Absolute: 0.2 10*3/uL (ref 0.0–0.5)
Eosinophils Relative: 2 %
HCT: 39.9 % (ref 36.0–46.0)
Hemoglobin: 12.8 g/dL (ref 12.0–15.0)
Immature Granulocytes: 1 %
Lymphocytes Relative: 26 %
Lymphs Abs: 3 10*3/uL (ref 0.7–4.0)
MCH: 29.6 pg (ref 26.0–34.0)
MCHC: 32.1 g/dL (ref 30.0–36.0)
MCV: 92.1 fL (ref 80.0–100.0)
Monocytes Absolute: 1.2 10*3/uL — ABNORMAL HIGH (ref 0.1–1.0)
Monocytes Relative: 10 %
Neutro Abs: 7 10*3/uL (ref 1.7–7.7)
Neutrophils Relative %: 60 %
Platelets: 318 10*3/uL (ref 150–400)
RBC: 4.33 MIL/uL (ref 3.87–5.11)
RDW: 12.1 % (ref 11.5–15.5)
WBC: 11.5 10*3/uL — ABNORMAL HIGH (ref 4.0–10.5)
nRBC: 0 % (ref 0.0–0.2)

## 2018-10-04 LAB — COMPREHENSIVE METABOLIC PANEL
ALT: 37 U/L (ref 0–44)
AST: 26 U/L (ref 15–41)
Albumin: 4.1 g/dL (ref 3.5–5.0)
Alkaline Phosphatase: 52 U/L (ref 38–126)
Anion gap: 6 (ref 5–15)
BUN: 15 mg/dL (ref 6–20)
CO2: 26 mmol/L (ref 22–32)
Calcium: 9.6 mg/dL (ref 8.9–10.3)
Chloride: 107 mmol/L (ref 98–111)
Creatinine, Ser: 0.67 mg/dL (ref 0.44–1.00)
GFR calc Af Amer: >60 mL/min (ref >60)
GFR calc non Af Amer: >60 mL/min (ref >60)
Glucose, Bld: 96 mg/dL (ref 70–99)
Potassium: 3.8 mmol/L (ref 3.5–5.1)
Sodium: 139 mmol/L (ref 135–145)
Total Bilirubin: 0.3 mg/dL (ref 0.3–1.2)
Total Protein: 7.1 g/dL (ref 6.5–8.1)

## 2018-10-04 LAB — POC URINE PREG, ED
Preg Test, Ur: NEGATIVE
Preg Test, Ur: NEGATIVE

## 2018-10-04 MED ORDER — LORAZEPAM 0.5 MG PO TABS
0.5000 mg | ORAL_TABLET | Freq: Four times a day (QID) | ORAL | Status: DC | PRN
  Administered 2018-10-04: 0.5 mg via ORAL
  Filled 2018-10-04: qty 1

## 2018-10-04 MED ORDER — METOPROLOL TARTRATE 25 MG PO TABS
25.0000 mg | ORAL_TABLET | Freq: Two times a day (BID) | ORAL | Status: DC
  Filled 2018-10-04 (×3): qty 1

## 2018-10-04 MED ORDER — METOPROLOL TARTRATE 25 MG PO TABS
25.0000 mg | ORAL_TABLET | Freq: Once | ORAL | Status: AC
  Administered 2018-10-04: 25 mg via ORAL
  Filled 2018-10-04: qty 1

## 2018-10-04 MED ORDER — LORAZEPAM 0.5 MG PO TABS
0.5000 mg | ORAL_TABLET | Freq: Once | ORAL | Status: DC
  Filled 2018-10-04: qty 1

## 2018-10-04 MED ORDER — ACETAMINOPHEN 325 MG PO TABS
650.0000 mg | ORAL_TABLET | Freq: Once | ORAL | Status: AC
  Administered 2018-10-04: 650 mg via ORAL
  Filled 2018-10-04: qty 2

## 2018-10-04 MED ORDER — IOHEXOL 300 MG/ML  SOLN
100.0000 mL | Freq: Once | INTRAMUSCULAR | Status: AC | PRN
  Administered 2018-10-04: 100 mL via INTRAVENOUS

## 2018-10-04 NOTE — ED Notes (Signed)
Patient transported to CT 

## 2018-10-04 NOTE — ED Notes (Signed)
Returned from CT scan.

## 2018-10-04 NOTE — ED Triage Notes (Signed)
Pt endorses tripping over a root and landing face first on the ground getting the wind knocked out of her. Now has bilateral rib cage pain and bilateral wrist pain. VSS.

## 2018-10-04 NOTE — Discharge Instructions (Signed)
Evaluated today after fall.  Your imaging was negative.  This is likely soft tissue injuries.  I would suggest taking Tylenol and ibuprofen as needed for pain.  You may follow-up with orthopedics if you continue to have pain.

## 2018-10-04 NOTE — ED Provider Notes (Signed)
[facility]   CSN: [id number] Arrival date & time: 10/04/18  1800  History   Chief Complaint Chief Complaint  Patient presents with  . Fall  . Wrist Pain    HPI Christina [name] is a 33 y.o. female who presents for evaluation after mechanical fall. Patient states she tripped over a root in her yard and landed on her chest, abdomen and bilateral wrist. Denies LOC, syncope or hitting head. Patient with bilateral rib cage pain and bilateral wrist pain. Rates her pain a 7/10. Denies radiation of pain. Denies anticoagulation. Also has pain located to her LUQ. Has not taken anything for her pain. Denies any additional or aggravating factors. Has been able to ambulate since the fall. Fall occurred 4 hours PTA. Was seen at [facility] and there was concern for spleen injury and sent to the ED for evaluation. Denies fever, HA, blurred vision, dizziness, syncope, weakness, facial pain, chills, neck pain, neck stiffness, back pain, bowel or bladder incontinence, saddle paresthesias, numbness or tingling in her extremities, cough, hemoptysis.  History obtained from patient. No interpretor was used.        The history is provided by the patient. No language interpreter was used.  Fall  This is a new problem. The current episode started 3 to 5 hours ago. Associated symptoms include abdominal pain. Pertinent negatives include no chest pain, no headaches and no shortness of breath. Nothing aggravates the symptoms. The symptoms are relieved by ice. She has tried a cold compress for the symptoms. The treatment provided mild relief.  Wrist Pain  This is a new problem. The current episode started 3 to 5 hours ago. The problem has not changed since onset.Associated symptoms include abdominal pain. Pertinent negatives include no chest pain, no headaches and no shortness of breath. Nothing aggravates the symptoms. The symptoms are relieved by ice. She has tried a cold  compress for the symptoms. The treatment provided mild relief.    Past Medical History:  Diagnosis Date  . Anemia    borderline anemic  . Anxiety   . Depression   . Eczema   . GERD (gastroesophageal reflux disease)    with pregnancy  . History of hip fracture 10/2010   still has pain, right hip  . Hyperemesis arising during pregnancy   . Increased heart rate    resting heart rate 100-120  . Low energy    on bedrest during pregnancy trying to regain strength  . SVD (spontaneous vaginal delivery) 12/16/2015  . SVT (supraventricular tachycardia) (HCC)   . UTI (lower urinary tract infection)   . Vaginitis and vulvovaginitis     Patient Active Problem List   Diagnosis Date Noted  . SVD (spontaneous vaginal delivery) 12/16/2015  . Active labor at term 12/15/2015  . Normal pregnancy in multigravida in third trimester 12/15/2015  . Other and unspecified ovarian cyst 06/20/2012  . Normal pregnancy 08/31/2011    Past Surgical History:  Procedure Laterality Date  . LAPAROSCOPIC TUBAL LIGATION Bilateral 02/28/2016   Procedure: LAPAROSCOPIC TUBAL LIGATION;  Surgeon: Lavina Hamman, MD;  Location: [facility];  Service: Gynecology;  Laterality: Bilateral;  . LAPAROSCOPY Left 06/20/2012   Procedure: LAPAROSCOPY OPERATIVE;  Surgeon: Lavina Hamman, MD;  Location: [facility];  Service: Gynecology;  Laterality: Left;  OPEN LAPAROSCOPY, removal of portion of left ovarian cyst wall  . WISDOM TOOTH EXTRACTION       OB History    Gravida  3   Para  3   Term  3   Preterm  0   AB  0   Living  3     SAB  0   TAB  0   Ectopic  0   Multiple  0   Live Births  3            Home Medications    Prior to Admission medications   Medication Sig Start Date End Date Taking? Authorizing Provider  amphetamine-dextroamphetamine (ADDERALL) 20 MG tablet Take 10 mg by mouth daily.    [provider]  Biotin w/ Vitamins C & E (HAIR/SKIN/NAILS PO) Take 1 tablet by mouth daily.     [provider]  diazepam (VALIUM) 10 MG tablet Take 1 tablet (10 mg total) by mouth every 8 (eight) hours as needed (Muscle spasm). 02/13/18   Mancel Bale, MD  LORazepam (ATIVAN) 0.5 MG tablet Take 0.5 mg by mouth 4 (four) times daily as needed for anxiety.     [provider]  metoprolol tartrate (LOPRESSOR) 25 MG tablet Take 1 tablet (25 mg total) by mouth 2 (two) times daily. 07/29/18   [name], PA-C  Multiple Vitamin (MULTIVITAMIN WITH MINERALS) TABS tablet Take 1 tablet by mouth daily.    [provider]  omega-3 acid ethyl esters (LOVAZA) 1 g capsule Take 1 g by mouth daily.    [provider]    Family History Family History  Problem Relation Age of Onset  . Alcohol abuse Mother   . Anxiety disorder Mother   . Asthma Mother   . Mental illness Mother        bipolar  . Depression Mother   . Hypothyroidism Mother   . Ovarian cysts Mother   . Hypertension Father   . Endometriosis Sister   . Ovarian cysts Sister   . Alcohol abuse Brother   . Crohn's disease Brother   . Alcohol abuse Maternal Aunt   . Cancer Maternal Aunt        breast  . Hypothyroidism Maternal Aunt   . Miscarriages / Stillbirths Maternal Aunt        stillbirth  . Alcohol abuse Maternal Uncle   . Asthma Maternal Grandmother   . Mental illness Maternal Grandmother        bipolar  . Asthma Maternal Grandfather   . Diabetes Maternal Grandfather   . Hypertension Maternal Grandfather   . Asthma Paternal Grandmother   . Hypertension Paternal Grandmother   . Asthma Paternal Grandfather   . Diabetes Paternal Grandfather   . Heart disease Paternal Grandfather   . Hypertension Paternal Grandfather     Social History Social History   Tobacco Use  . Smoking status: Never Smoker  . Smokeless tobacco: Never Used  Substance Use Topics  . Alcohol use: Yes    Comment: occ  . Drug use: No     Allergies   Sulfa antibiotics and Latex   Review of Systems  Review of Systems  Constitutional: Negative.   HENT: Negative.   Respiratory: Negative.  Negative for cough, shortness of breath and wheezing.   Cardiovascular: Negative for chest pain, palpitations and leg swelling.  Gastrointestinal: Positive for abdominal pain. Negative for abdominal distention, anal bleeding, blood in stool, constipation, diarrhea, nausea, rectal pain and vomiting.  Genitourinary: Negative.   Musculoskeletal: Negative for arthralgias, back pain, gait problem, joint swelling, neck pain and neck stiffness.       Bilateral wrist pain  Neurological: Negative for dizziness,  tremors, syncope, speech difficulty, weakness, light-headedness, numbness and headaches.  All other systems reviewed and are negative.    Physical Exam Updated Vital Signs BP 113/75 (BP Location: Left Arm)   Pulse 86   Temp 98.8 F (37.1 C) (Oral)   Resp 18   SpO2 100%   Physical Exam Vitals signs and nursing note reviewed.  Constitutional:      General: She is not in acute distress.    Appearance: She is well-developed. She is not ill-appearing, toxic-appearing or diaphoretic.     Comments: Sitting in bed texting on initial evaluation.  HENT:     Head: Normocephalic and atraumatic. No raccoon eyes, Battle's sign, abrasion, contusion, masses, right periorbital erythema or left periorbital erythema.     Jaw: There is normal jaw occlusion.     Comments: No contusions, abrasion or ecchymosis. No facial lacerations.    Mouth/Throat:     Lips: Pink. No lesions.     Mouth: Mucous membranes are moist. No injury, lacerations or oral lesions.     Dentition: Normal dentition.     Pharynx: Oropharynx is clear. Uvula midline.     Comments: Posterior oropharynx clear. Mucous membranes moist. Eyes:     General: Lids are normal.     Extraocular Movements: Extraocular movements intact.     Pupils: Pupils are equal, round, and reactive to light.     Comments: No horizontal, vertical or rotational  nystagmus   Neck:     Musculoskeletal: Full passive range of motion without pain and normal range of motion. Normal range of motion. No edema, erythema, neck rigidity, crepitus, injury, pain with movement, spinous process tenderness or muscular tenderness.     Trachea: Trachea and phonation normal.     Comments: Full active and passive ROM without pain No midline or paraspinal tenderness Cardiovascular:     Rate and Rhythm: Normal rate.     Pulses: Normal pulses.          Radial pulses are 2+ on the right side and 2+ on the left side.       Posterior tibial pulses are 2+ on the right side and 2+ on the left side.     Heart sounds: Normal heart sounds.  Pulmonary:     Effort: Pulmonary effort is normal. No tachypnea, accessory muscle usage, respiratory distress or retractions.     Breath sounds: Normal breath sounds and air entry. No decreased breath sounds, wheezing, rhonchi or rales.     Comments: Clear. Speaks in full sentences without difficulty. No assessory muscle usage. Chest:     Comments: Diffuse tenderness to palpation to bilateral lower anterior chest wall. No crepitus, step-offs, deformity, lacerations or echymosis or abrasions. Abdominal:     General: There is no distension.     Palpations: Abdomen is soft. There is no shifting dullness, hepatomegaly or splenomegaly.     Tenderness: There is abdominal tenderness in the left upper quadrant. There is guarding. There is no right CVA tenderness, left CVA tenderness or rebound. Negative signs include Rovsing's sign.     Hernia: No hernia is present.       Comments: Diffuse tenderness with guarding to LUQ. Normoactive bowel sounds. No abd wall skin changes.  Musculoskeletal: Normal range of motion.     Right wrist: Normal.     Left wrist: Normal.     Right hip: Normal.     Left hip: Normal.     Cervical back: Normal.     Thoracic back:  Normal.     Lumbar back: Normal.     Right upper arm: Normal.     Left upper arm: Normal.      Right forearm: Normal.     Left forearm: Normal.     Right hand: Normal.     Left hand: Normal.     Comments: Full ROM to bilateral upper and lower extremities. No tenderness to bilateral elbow, radius/ulna, wrist or digits. Able to pronate, supinate, flex and extend at bilateral wrist. No effusion, crepitus, step-offs, edema, scaphoid tenderness or deformity. No tenderness to midline spine or paraspinal muscles. Pelvis stable to pressure. No shortening or rotation of legs.  Lymphadenopathy:     Cervical: No cervical adenopathy.  Skin:    General: Skin is warm and dry.     Comments: No contusions, abrasions, lacerations or ecchymosis.  Brisk capillary refill.  Neurological:     Mental Status: She is alert.     Comments: CN 2-12 grossly intact. No facial assymetry. A/Ox3. Follow commands without difficulty. 5/5 strength in bilateral upper and lower extremities.     ED Treatments / Results  Labs (all labs ordered are listed, but only abnormal results are displayed) Labs Reviewed  CBC WITH DIFFERENTIAL/PLATELET - Abnormal; Notable for the following components:      Result Value   WBC 11.5 (*)    Monocytes Absolute 1.2 (*)    All other components within normal limits  COMPREHENSIVE METABOLIC PANEL  POC URINE PREG, ED  POC URINE PREG, ED    EKG None  Radiology Dg Chest 2 View  Result Date: 10/04/2018 CLINICAL DATA:  Pain status post fall EXAM: CHEST - 2 VIEW COMPARISON:  02/13/2018 FINDINGS: The heart size and mediastinal contours are within normal limits. Both lungs are clear. The visualized skeletal structures are unremarkable. IMPRESSION: No active cardiopulmonary disease. Electronically Signed   By: Katherine Mantlehristopher  Green M.D.   On: 10/04/2018 19:00   Dg Wrist Complete Left  Result Date: 10/04/2018 CLINICAL DATA:  Pain status post fall EXAM: LEFT WRIST - COMPLETE 3+ VIEW COMPARISON:  None. FINDINGS: There is no evidence of fracture or dislocation. There is no evidence of  arthropathy or other focal bone abnormality. Soft tissues are unremarkable. IMPRESSION: Negative. Electronically Signed   By: Katherine Mantlehristopher  Green M.D.   On: 10/04/2018 19:01   Dg Wrist Complete Right  Result Date: 10/04/2018 CLINICAL DATA:  Pain status post fall EXAM: RIGHT WRIST - COMPLETE 3+ VIEW COMPARISON:  None. FINDINGS: There is no acute displaced fracture or dislocation. There is a sclerotic lesion in the distal radius favored to represent a bone island. IMPRESSION: Negative. Electronically Signed   By: Katherine Mantlehristopher  Green M.D.   On: 10/04/2018 18:59   Ct Abdomen Pelvis W Contrast  Result Date: 10/04/2018 CLINICAL DATA:  33 year old female with fall and bilateral rib cage pain. EXAM: CT ABDOMEN AND PELVIS WITH CONTRAST TECHNIQUE: Multidetector CT imaging of the abdomen and pelvis was performed using the standard protocol following bolus administration of intravenous contrast. CONTRAST:  100mL OMNIPAQUE IOHEXOL 300 MG/ML  SOLN COMPARISON:  None. FINDINGS: Lower chest: The visualized lung bases are clear. No intra-abdominal free air or free fluid. Hepatobiliary: Apparent diffuse fatty infiltration of the liver. No intrahepatic biliary ductal dilatation. The gallbladder is unremarkable. Pancreas: Unremarkable. No pancreatic ductal dilatation or surrounding inflammatory changes. Spleen: Normal in size without focal abnormality. Adrenals/Urinary Tract: The adrenal glands are unremarkable. The kidneys, visualized ureters, and urinary bladder appear unremarkable. Stomach/Bowel: There is moderate stool throughout  the colon. No bowel obstruction or active inflammation. The appendix is normal. Vascular/Lymphatic: The abdominal aorta and IVC appear unremarkable. No portal venous gas. There is no adenopathy. Reproductive: The uterus is anteverted and grossly unremarkable. Bilateral ovarian follicles/cysts measure up to 4 cm on the left. Other: None Musculoskeletal: No acute or significant osseous findings.  IMPRESSION: 1. No acute intra-abdominal or pelvic pathology. 2. Fatty liver. Electronically Signed   By: Elgie Collard M.D.   On: 10/04/2018 21:32    Procedures Procedures (including critical care time)  Medications Ordered in ED Medications  LORazepam (ATIVAN) tablet 0.5 mg (0.5 mg Oral Given 10/04/18 1945)  LORazepam (ATIVAN) tablet 0.5 mg (0 mg Oral Hold 10/04/18 2005)  acetaminophen (TYLENOL) tablet 650 mg (650 mg Oral Given 10/04/18 1945)  metoprolol tartrate (LOPRESSOR) tablet 25 mg (25 mg Oral Given 10/04/18 2020)  iohexol (OMNIPAQUE) 300 MG/ML solution 100 mL (100 mLs Intravenous Contrast Given 10/04/18 2103)   Initial Impression / Assessment and Plan / ED Course  I have reviewed the triage vital signs and the nursing notes.  Pertinent labs & imaging results that were available during my care of the patient were reviewed by me and considered in my medical decision making (see chart for details).  33 year old female appears otherwise well presents for evaluation after mechanical fall.  Afebrile, nonseptic, non-ill-appearing. She denies syncope, LOC.  Ambulatory in ED after incident.  Was seen at urgent care for bilateral wrist and chest wall pain.  She did have some tenderness to her left upper quadrant and was concern for spleen injury and was sent to ED for evaluation.  Normal neurologic exam without neurologic deficits.  She has not had any episodes of emesis and is not on anticoagulation.  I have low suspicion for acute intracranial injury.  She has no facial tenderness, crepitus or step-offs, low suspicion for max/face fracture.  Normal musculoskeletal exam.  Patient with complaints of bilateral wrist pain however has full range of motion.  No overlying skin changes.  She does not have any deformity to her extremities, contusions, lacerations, ecchymosis or step-offs.  Neurovascularly intact.  Plain films bilateral wrists negative for fracture.  She has no tenderness to her bilateral  scaphoid, low suspicion for scaphoid fracture.  Likely musculoskeletal strain.  She does have some mild chest wall tenderness to her bilateral lower ribs.  There is no crepitus, step-offs or deformities.  No evidence of flail chest.  She has no overlying skin changes.  Her chest x-ray is negative for pneumothorax, rib fracture.  Abdomen is soft however does have tenderness to left upper quadrant over spleen with mild guarding.  She has normoactive bowel sounds.  She has no overlying skin changes to her abdominal wall.  Do feel patient needs CT AP to rule out spleen injury at this time.  Her vitals are stable.  1930: Patient requesting her home meds at this time.  She normally takes these 530-6 PM.  Normally takes Lopressor for tachycardia as well as Ativan.  Will order.  She does not want anything IV for pain currently.  Requesting Tylenol.  Will order.  2130: CT AP without acute pathology. Patient without tachycardia, tachypnea or hypoxia. Ambulatory in ED without difficulty. Tolerating PO intake without difficulty and no emesis. Revaluation abd soft without rebound or guarding. Likely MSK strain from fall. Pain resolved with Tylenol.  Patient without signs of serious head, neck, or back injury. No midline spinal tenderness or TTP of the chest. Normal neurological exam. No  concern for closed head injury, lung injury. Normal muscle soreness after fall.   Pt is hemodynamically stable, in NAD.   Pain has been managed & pt has no complaints prior to dc.  Patient counseled on typical course of muscle stiffness and soreness. Discussed s/s that should cause them to return. Patient instructed on NSAID use. Instructed that prescribed medicine can cause drowsiness and they should not work, drink alcohol, or drive while taking this medicine. Encouraged PCP follow-up for recheck if symptoms are not improved in one week.   The patient has been appropriately medically screened and/or stabilized in the ED. I have low  suspicion for any other emergent medical condition which would require further screening, evaluation or treatment in the ED or require inpatient management.     Final Clinical Impressions(s) / ED Diagnoses   Final diagnoses:  Fall, initial encounter  Pain in both wrists  Acute LUQ pain    ED Discharge Orders    None       Adalyna Godbee A, PA-C 10/04/18 2144    Niel Hummer, MD 10/06/18 [phone]

## 2018-10-04 NOTE — ED Notes (Signed)
Pt reports ibuprofen around 11am when injury occurred.

## 2018-12-16 ENCOUNTER — Telehealth: Payer: Self-pay | Admitting: Physician Assistant

## 2018-12-16 NOTE — Telephone Encounter (Signed)
Pt is currently in the car (not driving) so she was unable to take her BP but she did take her HR was 90bpm. She reports taking 50mg  Metoprolol instead of her prescribed dose of 25mg . Pt reports feeling drowsy but no other symtpoms at this time. Advised pt to take her BP as soon as possible and call us back.

## 2018-12-16 NOTE — Telephone Encounter (Signed)
Patient took her metoprolol tartrate (LOPRESSOR) 25 MG tablet twice this morning, she wants to make sure that is okay.  She feels drowse, that is the only symptom she is having.

## 2019-01-11 ENCOUNTER — Other Ambulatory Visit: Payer: Self-pay

## 2019-01-11 ENCOUNTER — Ambulatory Visit (INDEPENDENT_AMBULATORY_CARE_PROVIDER_SITE_OTHER): Payer: BLUE CROSS/BLUE SHIELD | Admitting: Adult Health

## 2019-01-11 ENCOUNTER — Encounter: Payer: Self-pay | Admitting: Adult Health

## 2019-01-11 DIAGNOSIS — F329 Major depressive disorder, single episode, unspecified: Secondary | ICD-10-CM | POA: Diagnosis not present

## 2019-01-11 DIAGNOSIS — F331 Major depressive disorder, recurrent, moderate: Secondary | ICD-10-CM

## 2019-01-11 DIAGNOSIS — F411 Generalized anxiety disorder: Secondary | ICD-10-CM

## 2019-01-11 DIAGNOSIS — F909 Attention-deficit hyperactivity disorder, unspecified type: Secondary | ICD-10-CM

## 2019-01-11 MED ORDER — AMPHETAMINE-DEXTROAMPHETAMINE 20 MG PO TABS: ORAL_TABLET | ORAL | 0 refills | Status: DC

## 2019-01-11 MED ORDER — LORAZEPAM 1 MG PO TABS: ORAL_TABLET | ORAL | 2 refills | Status: DC

## 2019-01-11 NOTE — Progress Notes (Signed)
Christina [name] [id number] 01/25/1986 33 y.o.  Virtual Visit via Telephone Note  I connected with pt on 01/11/19 at  2:30 PM EDT by telephone and verified that I am speaking with the correct person using two identifiers.   I discussed the limitations, risks, security and privacy concerns of performing an evaluation and management service by telephone and the availability of in person appointments. I also discussed with the patient that there may be a patient responsible charge related to this service. The patient expressed understanding and agreed to proceed.   I discussed the assessment and treatment plan with the patient. The patient was provided an opportunity to ask questions and all were answered. The patient agreed with the plan and demonstrated an understanding of the instructions.   The patient was advised to call back or seek an in-person evaluation if the symptoms worsen or if the condition fails to improve as anticipated.  I provided 30 minutes of non-face-to-face time during this encounter.  The patient was located at home.  [facility]   Dorothyann Gibbsegina N Sherida Dobkins, NP   Subjective:   Patient ID:  Christina [name] is a 33 y.o. (DOB 04/16/1986) female.  Chief Complaint:  Chief Complaint  Patient presents with  . ADHD  . Anxiety  . Depression    HPI Christina [name] presents for follow-up of GAD, MDD, and ADHD.  Describes mood today as "ok". Pleasant. Mood symptoms - denies depression, anxiety, and irritability. Stating "I'm hanging in there". Increased stress with taking 4 classes - starting pre-dissertation in October. Children doing well - oldest son struggling with Covid-19 implications. Had to let nanny "go". Felt like her lifestyle was not a "good match" with there restrictions during Covid-19. Ex has been supportive and helpful while she's doing school work. Stable interest and motivation. Taking medications as prescribed.  Energy levels  stable. Active, does not have a regular exercise routine. Physicist, medicalull-time student.  Enjoys some usual interests and activities. Single - lives with 3 children. Spending time with family. Appetite adequate. Weight stable. Hired a trainer - comes to her house 3 times a week.  Sleeps better some nights than others. Averages 6 hours. Stating "I'm getting enough sleep".  Focus and concentration stable. Completing tasks. Managing aspects of household. Doing well with school work. Denies SI or HI.  Denies AH or VH.  Review of Systems:  Review of Systems  Musculoskeletal: Negative for gait problem.  Neurological: Negative for tremors.  Psychiatric/Behavioral:       Please refer to HPI    Medications: I have reviewed the patient's current medications.  Current Outpatient Medications  Medication Sig Dispense Refill  . amphetamine-dextroamphetamine (ADDERALL) 20 MG tablet Take one tablet three times daily. 90 tablet 0  . [START ON 02/08/2019] amphetamine-dextroamphetamine (ADDERALL) 20 MG tablet Take one tablet three times daily. 90 tablet 0  . [START ON 03/08/2019] amphetamine-dextroamphetamine (ADDERALL) 20 MG tablet Take one tablet three times daily. 90 tablet 0  . Biotin w/ Vitamins C & E (HAIR/SKIN/NAILS PO) Take 1 tablet by mouth daily.    . diazepam (VALIUM) 10 MG tablet Take 1 tablet (10 mg total) by mouth every 8 (eight) hours as needed (Muscle spasm). 20 tablet 0  . LORazepam (ATIVAN) 1 MG tablet Take one tablet TID as needed for anxiety. 90 tablet 2  . metoprolol tartrate (LOPRESSOR) 25 MG tablet Take 1 tablet (25 mg total) by mouth 2 (two) times daily. 180 tablet 3  .  Multiple Vitamin (MULTIVITAMIN WITH MINERALS) TABS tablet Take 1 tablet by mouth daily.    Marland Kitchen. omega-3 acid ethyl esters (LOVAZA) 1 g capsule Take 1 g by mouth daily.     No current facility-administered medications for this visit.     Medication Side Effects: None  Allergies:  Allergies  Allergen Reactions  . Sulfa  Antibiotics Hives, Itching and Swelling  . Latex Itching    Past Medical History:  Diagnosis Date  . Anemia    borderline anemic  . Anxiety   . Depression   . Eczema   . GERD (gastroesophageal reflux disease)    with pregnancy  . History of hip fracture 10/2010   still has pain, right hip  . Hyperemesis arising during pregnancy   . Increased heart rate    resting heart rate 100-120  . Low energy    on bedrest during pregnancy trying to regain strength  . SVD (spontaneous vaginal delivery) 12/16/2015  . SVT (supraventricular tachycardia) (HCC)   . UTI (lower urinary tract infection)   . Vaginitis and vulvovaginitis     Family History  Problem Relation Age of Onset  . Alcohol abuse Mother   . Anxiety disorder Mother   . Asthma Mother   . Mental illness Mother        bipolar  . Depression Mother   . Hypothyroidism Mother   . Ovarian cysts Mother   . Hypertension Father   . Endometriosis Sister   . Ovarian cysts Sister   . Alcohol abuse Brother   . Crohn's disease Brother   . Alcohol abuse Maternal Aunt   . Cancer Maternal Aunt        breast  . Hypothyroidism Maternal Aunt   . Miscarriages / Stillbirths Maternal Aunt        stillbirth  . Alcohol abuse Maternal Uncle   . Asthma Maternal Grandmother   . Mental illness Maternal Grandmother        bipolar  . Asthma Maternal Grandfather   . Diabetes Maternal Grandfather   . Hypertension Maternal Grandfather   . Asthma Paternal Grandmother   . Hypertension Paternal Grandmother   . Asthma Paternal Grandfather   . Diabetes Paternal Grandfather   . Heart disease Paternal Grandfather   . Hypertension Paternal Grandfather     Social History   Socioeconomic History  . Marital status: Married    Spouse name: Not on file  . Number of children: Not on file  . Years of education: Not on file  . Highest education level: Not on file  Occupational History  . Not on file  Social Needs  . Financial resource strain: Not  on file  . Food insecurity    Worry: Not on file    Inability: Not on file  . Transportation needs    Medical: Not on file    Non-medical: Not on file  Tobacco Use  . Smoking status: Never Smoker  . Smokeless tobacco: Never Used  Substance and Sexual Activity  . Alcohol use: Yes    Comment: occ  . Drug use: No  . Sexual activity: Yes    Birth control/protection: None  Lifestyle  . Physical activity    Days per week: Not on file    Minutes per session: Not on file  . Stress: Not on file  Relationships  . Social Musicianconnections    Talks on phone: Not on file    Gets together: Not on file    Attends religious  service: Not on file    Active member of club or organization: Not on file    Attends meetings of clubs or organizations: Not on file    Relationship status: Not on file  . Intimate partner violence    Fear of current or ex partner: Not on file    Emotionally abused: Not on file    Physically abused: Not on file    Forced sexual activity: Not on file  Other Topics Concern  . Not on file  Social History Narrative  . Not on file    Past Medical History, Surgical history, Social history, and Family history were reviewed and updated as appropriate.   Please see review of systems for further details on the patient's review from today.   Objective:   Physical Exam:  There were no vitals taken for this visit.  Physical Exam Constitutional:      General: She is not in acute distress.    Appearance: She is well-developed.  Musculoskeletal:        General: No deformity.  Neurological:     Mental Status: She is alert and oriented to person, place, and time.     Coordination: Coordination normal.  Psychiatric:        Attention and Perception: Attention and perception normal. She does not perceive auditory or visual hallucinations.        Mood and Affect: Mood normal. Mood is not anxious or depressed. Affect is not labile, blunt, angry or inappropriate.        Speech:  Speech normal.        Behavior: Behavior normal.        Thought Content: Thought content normal. Thought content is not paranoid or delusional. Thought content does not include homicidal or suicidal ideation. Thought content does not include homicidal or suicidal plan.        Cognition and Memory: Cognition and memory normal.        Judgment: Judgment normal.     Comments: Insight intact     Lab Review:     Component Value Date/Time   NA 139 10/04/2018 1940   K 3.8 10/04/2018 1940   CL 107 10/04/2018 1940   CO2 26 10/04/2018 1940   GLUCOSE 96 10/04/2018 1940   BUN 15 10/04/2018 1940   CREATININE 0.67 10/04/2018 1940   CALCIUM 9.6 10/04/2018 1940   PROT 7.1 10/04/2018 1940   ALBUMIN 4.1 10/04/2018 1940   AST 26 10/04/2018 1940   ALT 37 10/04/2018 1940   ALKPHOS 52 10/04/2018 1940   BILITOT 0.3 10/04/2018 1940   GFRNONAA >60 10/04/2018 1940   GFRAA >60 10/04/2018 1940       Component Value Date/Time   WBC 11.5 (H) 10/04/2018 1940   RBC 4.33 10/04/2018 1940   HGB 12.8 10/04/2018 1940   HCT 39.9 10/04/2018 1940   PLT 318 10/04/2018 1940   MCV 92.1 10/04/2018 1940   MCH 29.6 10/04/2018 1940   MCHC 32.1 10/04/2018 1940   RDW 12.1 10/04/2018 1940   LYMPHSABS 3.0 10/04/2018 1940   MONOABS 1.2 (H) 10/04/2018 1940   EOSABS 0.2 10/04/2018 1940   BASOSABS 0.1 10/04/2018 1940    No results found for: POCLITH, LITHIUM   No results found for: PHENYTOIN, PHENOBARB, VALPROATE, CBMZ   .res Assessment: Plan:    Plan:  1. Adderall 20mg  TID 2. Ativan 1mg  TID  Continue therapy as needed  RTC 3 months  Patient advised to contact office with any questions, adverse  effects, or acute worsening in signs and symptoms.  Stuart was seen today for adhd, anxiety and depression.  Diagnoses and all orders for this visit:  Generalized anxiety disorder -     LORazepam (ATIVAN) 1 MG tablet; Take one tablet TID as needed for anxiety.  Attention deficit hyperactivity disorder  (ADHD), unspecified ADHD type -     amphetamine-dextroamphetamine (ADDERALL) 20 MG tablet; Take one tablet three times daily. -     amphetamine-dextroamphetamine (ADDERALL) 20 MG tablet; Take one tablet three times daily. -     amphetamine-dextroamphetamine (ADDERALL) 20 MG tablet; Take one tablet three times daily.  Major depressive disorder, recurrent episode, moderate (HCC)  Major depressive disorder, remission status unspecified, unspecified whether recurrent  GAD (generalized anxiety disorder)    Please see After Visit Summary for patient specific instructions.  No future appointments.  No orders of the defined types were placed in this encounter.     -------------------------------

## 2019-01-26 ENCOUNTER — Telehealth: Payer: Self-pay | Admitting: Internal Medicine

## 2019-01-26 NOTE — Telephone Encounter (Signed)
No answer, vm full

## 2019-01-26 NOTE — Telephone Encounter (Signed)
Phoned patient, no answer, voicemail box full, unable to leave message.

## 2019-01-26 NOTE — Telephone Encounter (Signed)
New Message:  Patient called and wanted to know if the heart condition that she was diagnosed with is genetic or hereditary. Her son is starting to exhibit some of the symptoms that she had, which lead to her diagnosis. She needs to know what to be able to tell her son's pediatrician.  Please advise

## 2019-01-27 ENCOUNTER — Telehealth: Payer: Self-pay

## 2019-01-27 NOTE — Telephone Encounter (Signed)
Returned call to Pt.  Advised in general population chance of having SVT is 05/998  For children of parents with SVT chance is 1/100  Pt thanked nurse for call.

## 2019-01-30 ENCOUNTER — Telehealth: Payer: Self-pay | Admitting: Internal Medicine

## 2019-01-30 MED ORDER — METOPROLOL TARTRATE 25 MG PO TABS: ORAL_TABLET | ORAL | 3 refills | Status: DC

## 2019-01-30 NOTE — Telephone Encounter (Signed)
New Message  Pt c/o medication issue:  1. Name of Medication: metoprolol tartrate (LOPRESSOR) 25 MG tablet  2. How are you currently taking this medication (dosage and times per day)? 25 mg twice a day  3. Are you having a reaction (difficulty breathing--STAT)? No  4. What is your medication issue? Patient is noticing that the medication is wearing off around 1 pm each day and wanted to know should she take another pill when this happens. Patient notices an increase in bp and hr around this time. Please give patient a call back to discuss.

## 2019-01-30 NOTE — Telephone Encounter (Signed)
Call returned to Pt.  Advised per Dr. Corliss Parish to take an extra metoprolol daily as needed for increased heart rate.  Sent updated script to Pt pharmacy.

## 2019-02-10 DIAGNOSIS — M546 Pain in thoracic spine: Secondary | ICD-10-CM | POA: Diagnosis not present

## 2019-02-10 DIAGNOSIS — M542 Cervicalgia: Secondary | ICD-10-CM | POA: Diagnosis not present

## 2019-02-15 ENCOUNTER — Other Ambulatory Visit: Payer: Self-pay

## 2019-02-15 DIAGNOSIS — Z20828 Contact with and (suspected) exposure to other viral communicable diseases: Secondary | ICD-10-CM | POA: Diagnosis not present

## 2019-02-15 DIAGNOSIS — Z20822 Contact with and (suspected) exposure to covid-19: Secondary | ICD-10-CM

## 2019-02-17 LAB — NOVEL CORONAVIRUS, NAA: SARS-CoV-2, NAA: NOT DETECTED

## 2019-02-21 DIAGNOSIS — R200 Anesthesia of skin: Secondary | ICD-10-CM | POA: Diagnosis not present

## 2019-03-08 DIAGNOSIS — M546 Pain in thoracic spine: Secondary | ICD-10-CM | POA: Diagnosis not present

## 2019-03-08 DIAGNOSIS — S233XXD Sprain of ligaments of thoracic spine, subsequent encounter: Secondary | ICD-10-CM | POA: Diagnosis not present

## 2019-03-08 DIAGNOSIS — M2560 Stiffness of unspecified joint, not elsewhere classified: Secondary | ICD-10-CM | POA: Diagnosis not present

## 2019-03-09 ENCOUNTER — Other Ambulatory Visit: Payer: Self-pay | Admitting: Orthopedic Surgery

## 2019-03-09 DIAGNOSIS — M546 Pain in thoracic spine: Secondary | ICD-10-CM

## 2019-03-13 ENCOUNTER — Other Ambulatory Visit: Payer: Self-pay

## 2019-03-13 ENCOUNTER — Ambulatory Visit
Admission: RE | Admit: 2019-03-13 | Discharge: 2019-03-13 | Disposition: A | Discharge status: Home / Self Care | Payer: BC Managed Care – PPO | Source: Ambulatory Visit | Attending: Orthopedic Surgery | Admitting: Orthopedic Surgery

## 2019-03-13 DIAGNOSIS — M546 Pain in thoracic spine: Secondary | ICD-10-CM

## 2019-03-15 DIAGNOSIS — M2560 Stiffness of unspecified joint, not elsewhere classified: Secondary | ICD-10-CM | POA: Diagnosis not present

## 2019-03-15 DIAGNOSIS — S233XXD Sprain of ligaments of thoracic spine, subsequent encounter: Secondary | ICD-10-CM | POA: Diagnosis not present

## 2019-03-15 DIAGNOSIS — M546 Pain in thoracic spine: Secondary | ICD-10-CM | POA: Diagnosis not present

## 2019-04-07 ENCOUNTER — Other Ambulatory Visit: Payer: Self-pay

## 2019-04-07 ENCOUNTER — Encounter (HOSPITAL_COMMUNITY): Payer: Self-pay

## 2019-04-07 ENCOUNTER — Emergency Department (HOSPITAL_COMMUNITY): Payer: BC Managed Care – PPO

## 2019-04-07 ENCOUNTER — Emergency Department (HOSPITAL_COMMUNITY)
Admission: EM | Admit: 2019-04-07 | Discharge: 2019-04-07 | Disposition: A | Discharge status: Home / Self Care | Payer: BC Managed Care – PPO | Attending: Emergency Medicine | Admitting: Emergency Medicine

## 2019-04-07 DIAGNOSIS — Z5321 Procedure and treatment not carried out due to patient leaving prior to being seen by health care provider: Secondary | ICD-10-CM | POA: Diagnosis not present

## 2019-04-07 DIAGNOSIS — K8020 Calculus of gallbladder without cholecystitis without obstruction: Secondary | ICD-10-CM | POA: Diagnosis not present

## 2019-04-07 DIAGNOSIS — R10811 Right upper quadrant abdominal tenderness: Secondary | ICD-10-CM | POA: Diagnosis not present

## 2019-04-07 DIAGNOSIS — K810 Acute cholecystitis: Secondary | ICD-10-CM | POA: Diagnosis not present

## 2019-04-07 DIAGNOSIS — R071 Chest pain on breathing: Secondary | ICD-10-CM | POA: Insufficient documentation

## 2019-04-07 DIAGNOSIS — E669 Obesity, unspecified: Secondary | ICD-10-CM | POA: Diagnosis not present

## 2019-04-07 DIAGNOSIS — K760 Fatty (change of) liver, not elsewhere classified: Secondary | ICD-10-CM | POA: Diagnosis not present

## 2019-04-07 DIAGNOSIS — R079 Chest pain, unspecified: Secondary | ICD-10-CM | POA: Insufficient documentation

## 2019-04-07 DIAGNOSIS — K8010 Calculus of gallbladder with chronic cholecystitis without obstruction: Secondary | ICD-10-CM | POA: Diagnosis not present

## 2019-04-07 DIAGNOSIS — K819 Cholecystitis, unspecified: Secondary | ICD-10-CM | POA: Diagnosis not present

## 2019-04-07 LAB — BASIC METABOLIC PANEL
Anion gap: 11 (ref 5–15)
BUN: 19 mg/dL (ref 6–20)
CO2: 22 mmol/L (ref 22–32)
Calcium: 9.1 mg/dL (ref 8.9–10.3)
Chloride: 104 mmol/L (ref 98–111)
Creatinine, Ser: 0.63 mg/dL (ref 0.44–1.00)
GFR calc Af Amer: >60 mL/min (ref >60)
GFR calc non Af Amer: >60 mL/min (ref >60)
Glucose, Bld: 94 mg/dL (ref 70–99)
Potassium: 3.8 mmol/L (ref 3.5–5.1)
Sodium: 137 mmol/L (ref 135–145)

## 2019-04-07 LAB — I-STAT BETA HCG BLOOD, ED (MC, WL, AP ONLY): I-stat hCG, quantitative: <5 m[IU]/mL (ref <5)

## 2019-04-07 LAB — CBC
HCT: 40.2 % (ref 36.0–46.0)
Hemoglobin: 12.9 g/dL (ref 12.0–15.0)
MCH: 30.2 pg (ref 26.0–34.0)
MCHC: 32.1 g/dL (ref 30.0–36.0)
MCV: 94.1 fL (ref 80.0–100.0)
Platelets: 317 10*3/uL (ref 150–400)
RBC: 4.27 MIL/uL (ref 3.87–5.11)
RDW: 12.1 % (ref 11.5–15.5)
WBC: 13.1 10*3/uL — ABNORMAL HIGH (ref 4.0–10.5)
nRBC: 0 % (ref 0.0–0.2)

## 2019-04-07 LAB — LIPASE, BLOOD: Lipase: 33 U/L (ref 11–51)

## 2019-04-07 LAB — TROPONIN I (HIGH SENSITIVITY): Troponin I (High Sensitivity): 2 ng/L (ref <18)

## 2019-04-07 MED ORDER — OXYCODONE-ACETAMINOPHEN 5-325 MG PO TABS
1.0000 | ORAL_TABLET | ORAL | Status: DC | PRN
  Filled 2019-04-07: qty 1

## 2019-04-07 NOTE — ED Triage Notes (Signed)
Pt arrives POV for eval of chest pain onset this AM. Pt reports pain feels like a contraction, worse w/ deep breath,worse w/ movement. Pt is tearful and appears uncomfortable in triage. Denies SOB

## 2019-04-07 NOTE — ED Notes (Signed)
Offered pt percocet for pain to patient, pt stated "my husband is coming to get me and take me somewhere with a shorter wait". Encouraged pt to stay and be seen, pt ultimately decided to leave. Seen leaving the lobby w/ husband, steady gait, NAD.

## 2019-04-08 DIAGNOSIS — K824 Cholesterolosis of gallbladder: Secondary | ICD-10-CM | POA: Diagnosis not present

## 2019-04-08 DIAGNOSIS — K819 Cholecystitis, unspecified: Secondary | ICD-10-CM | POA: Diagnosis not present

## 2019-04-08 DIAGNOSIS — K8010 Calculus of gallbladder with chronic cholecystitis without obstruction: Secondary | ICD-10-CM | POA: Diagnosis not present

## 2019-04-10 ENCOUNTER — Ambulatory Visit: Payer: Self-pay | Admitting: Adult Health

## 2019-04-10 DIAGNOSIS — R110 Nausea: Secondary | ICD-10-CM | POA: Diagnosis not present

## 2019-04-10 DIAGNOSIS — K5900 Constipation, unspecified: Secondary | ICD-10-CM | POA: Diagnosis not present

## 2019-04-11 ENCOUNTER — Other Ambulatory Visit: Payer: Self-pay

## 2019-04-11 ENCOUNTER — Ambulatory Visit (INDEPENDENT_AMBULATORY_CARE_PROVIDER_SITE_OTHER): Payer: BC Managed Care – PPO | Admitting: Adult Health

## 2019-04-11 ENCOUNTER — Encounter: Payer: Self-pay | Admitting: Adult Health

## 2019-04-11 ENCOUNTER — Other Ambulatory Visit: Payer: Self-pay | Admitting: Physician Assistant

## 2019-04-11 DIAGNOSIS — F411 Generalized anxiety disorder: Secondary | ICD-10-CM

## 2019-04-11 DIAGNOSIS — F909 Attention-deficit hyperactivity disorder, unspecified type: Secondary | ICD-10-CM | POA: Diagnosis not present

## 2019-04-11 DIAGNOSIS — F329 Major depressive disorder, single episode, unspecified: Secondary | ICD-10-CM | POA: Diagnosis not present

## 2019-04-11 MED ORDER — LORAZEPAM 1 MG PO TABS: ORAL_TABLET | ORAL | 2 refills | Status: DC

## 2019-04-11 MED ORDER — AMPHETAMINE-DEXTROAMPHETAMINE 20 MG PO TABS: ORAL_TABLET | ORAL | 0 refills | Status: DC

## 2019-04-11 NOTE — Progress Notes (Signed)
Christina [name] [id number] 09/30/1985 33 y.o.   Virtual Visit via Telephone Note  I connected with pt on 04/11/19 at 11:20 AM EST by telephone and verified that I am speaking with the correct person using two identifiers.   I discussed the limitations, risks, security and privacy concerns of performing an evaluation and management service by telephone and the availability of in person appointments. I also discussed with the patient that there may be a patient responsible charge related to this service. The patient expressed understanding and agreed to proceed.   I discussed the assessment and treatment plan with the patient. The patient was provided an opportunity to ask questions and all were answered. The patient agreed with the plan and demonstrated an understanding of the instructions.   The patient was advised to call back or seek an in-person evaluation if the symptoms worsen or if the condition fails to improve as anticipated.  I provided 30 minutes of non-face-to-face time during this encounter.  The patient was located at home.  The provider was located at [facility].   Dorothyann Gibbs, NP   Subjective:   Patient ID:  Christina [name] is a 33 y.o.  (DOB 13-Oct-1985) female.  Chief Complaint:  Chief Complaint  Patient presents with  . Anxiety  . Depression  . ADHD    HPI   Christina [name] presents for follow-up of GAD, MDD, and ADHD.  Describes mood today as "ok". Pleasant. Mood symptoms - denies depression. Feels like anxiety and irritability are "worse" lately. Feels like "anxiety is higher lately, but not sure why". Has been "short" with the kids - "at times". Getting irritated "more often" - "over little things". Concerned that she is "letting herself get aggravated". Stating "I get annoyed, but I do get over it quickly". Went to get kids lately and started feeling "anxious". Recent Gallbladder surgery - went into ED Friday night in pain. Was in surgery less  than 10 hours later.Stable interest and motivation. Taking medications as prescribed.  Energy levels improving. Active, does not have a regular exercise routine. 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 Single - lives with 3 children (has help with children). Sister staying with her while she recovers.  Appetite adequate. Weight stable - having to eat every 5 to 6 hours to take medication. Sleeps better some nights than others. Averages 8 hours. Taking Benadryl prn sleep. Focus and concentration stable. Completing tasks. Managing aspects of household. Doing well with school work - Writer.  Denies SI or HI. Denies AH or VH.  Review of Systems:  Review of Systems  Musculoskeletal: Negative for gait problem.  Neurological: Negative for tremors.  Psychiatric/Behavioral:       Please refer to HPI    Medications: I have reviewed the patient's current medications.  Current Outpatient Medications  Medication Sig Dispense Refill  . amphetamine-dextroamphetamine (ADDERALL) 20 MG tablet Take one tablet three times daily. 90 tablet 0  . [START ON 05/09/2019] amphetamine-dextroamphetamine (ADDERALL) 20 MG tablet Take one tablet three times daily. 90 tablet 0  . [START ON 06/06/2019] amphetamine-dextroamphetamine (ADDERALL) 20 MG tablet Take one tablet three times daily. 90 tablet 0  . Biotin w/ Vitamins C & E (HAIR/SKIN/NAILS PO) Take 1 tablet by mouth daily.    . diazepam (VALIUM) 10 MG tablet Take 1 tablet (10 mg total) by mouth every 8 (eight) hours as needed (Muscle spasm). 20 tablet 0  . LORazepam (ATIVAN) 1 MG tablet Take one tablet TID  as needed for anxiety. 90 tablet 2  . metoprolol tartrate (LOPRESSOR) 25 MG tablet TAKE 1 TABLET BY MOUTH TWICE A DAY 166 tablet 0  . Multiple Vitamin (MULTIVITAMIN WITH MINERALS) TABS tablet Take 1 tablet by mouth daily.    Marland Kitchen omega-3 acid ethyl esters (LOVAZA) 1 g capsule Take 1 g by mouth daily.     No current  facility-administered medications for this visit.     Medication Side Effects: None  Allergies:  Allergies  Allergen Reactions  . Sulfa Antibiotics Hives, Itching and Swelling  . Latex Itching    Past Medical History:  Diagnosis Date  . Anemia    borderline anemic  . Anxiety   . Depression   . Eczema   . GERD (gastroesophageal reflux disease)    with pregnancy  . History of hip fracture 10/2010   still has pain, right hip  . Hyperemesis arising during pregnancy   . Increased heart rate    resting heart rate 100-120  . Low energy    on bedrest during pregnancy trying to regain strength  . SVD (spontaneous vaginal delivery) 12/16/2015  . SVT (supraventricular tachycardia) (HCC)   . UTI (lower urinary tract infection)   . Vaginitis and vulvovaginitis     Family History  Problem Relation Age of Onset  . Alcohol abuse Mother   . Anxiety disorder Mother   . Asthma Mother   . Mental illness Mother        bipolar  . Depression Mother   . Hypothyroidism Mother   . Ovarian cysts Mother   . Hypertension Father   . Endometriosis Sister   . Ovarian cysts Sister   . Alcohol abuse Brother   . Crohn's disease Brother   . Alcohol abuse Maternal Aunt   . Cancer Maternal Aunt        breast  . Hypothyroidism Maternal Aunt   . Miscarriages / Stillbirths Maternal Aunt        stillbirth  . Alcohol abuse Maternal Uncle   . Asthma Maternal Grandmother   . Mental illness Maternal Grandmother        bipolar  . Asthma Maternal Grandfather   . Diabetes Maternal Grandfather   . Hypertension Maternal Grandfather   . Asthma Paternal Grandmother   . Hypertension Paternal Grandmother   . Asthma Paternal Grandfather   . Diabetes Paternal Grandfather   . Heart disease Paternal Grandfather   . Hypertension Paternal Grandfather     Social History   Socioeconomic History  . Marital status: Married    Spouse name: Not on file  . Number of children: Not on file  . Years of education:  Not on file  . Highest education level: Not on file  Occupational History  . Not on file  Social Needs  . Financial resource strain: Not on file  . Food insecurity    Worry: Not on file    Inability: Not on file  . Transportation needs    Medical: Not on file    Non-medical: Not on file  Tobacco Use  . Smoking status: Never Smoker  . Smokeless tobacco: Never Used  Substance and Sexual Activity  . Alcohol use: Yes    Comment: occ  . Drug use: No  . Sexual activity: Yes    Birth control/protection: None  Lifestyle  . Physical activity    Days per week: Not on file    Minutes per session: Not on file  . Stress: Not on file  Relationships  . Social Herbalist on phone: Not on file    Gets together: Not on file    Attends religious service: Not on file    Active member of club or organization: Not on file    Attends meetings of clubs or organizations: Not on file    Relationship status: Not on file  . Intimate partner violence    Fear of current or ex partner: Not on file    Emotionally abused: Not on file    Physically abused: Not on file    Forced sexual activity: Not on file  Other Topics Concern  . Not on file  Social History Narrative  . Not on file    Past Medical History, Surgical history, Social history, and Family history were reviewed and updated as appropriate.   Please see review of systems for further details on the patient's review from today.   Objective:   Physical Exam:  There were no vitals taken for this visit.  Physical Exam Constitutional:      General: She is not in acute distress.    Appearance: She is well-developed.  Musculoskeletal:        General: No deformity.  Neurological:     Mental Status: She is alert and oriented to person, place, and time.     Coordination: Coordination normal.  Psychiatric:        Attention and Perception: Attention and perception normal. She does not perceive auditory or visual hallucinations.         Mood and Affect: Mood is anxious. Mood is not depressed. Affect is not labile, blunt, angry or inappropriate.        Speech: Speech normal.        Behavior: Behavior is agitated.        Thought Content: Thought content normal. Thought content is not paranoid or delusional. Thought content does not include homicidal or suicidal ideation. Thought content does not include homicidal or suicidal plan.        Cognition and Memory: Cognition and memory normal.        Judgment: Judgment normal.     Comments: Insight intact    Lab Review:     Component Value Date/Time   NA 137 04/07/2019 1834   K 3.8 04/07/2019 1834   CL 104 04/07/2019 1834   CO2 22 04/07/2019 1834   GLUCOSE 94 04/07/2019 1834   BUN 19 04/07/2019 1834   CREATININE 0.63 04/07/2019 1834   CALCIUM 9.1 04/07/2019 1834   PROT 7.1 10/04/2018 1940   ALBUMIN 4.1 10/04/2018 1940   AST 26 10/04/2018 1940   ALT 37 10/04/2018 1940   ALKPHOS 52 10/04/2018 1940   BILITOT 0.3 10/04/2018 1940   GFRNONAA >60 04/07/2019 1834   GFRAA >60 04/07/2019 1834       Component Value Date/Time   WBC 13.1 (H) 04/07/2019 1834   RBC 4.27 04/07/2019 1834   HGB 12.9 04/07/2019 1834   HCT 40.2 04/07/2019 1834   PLT 317 04/07/2019 1834   MCV 94.1 04/07/2019 1834   MCH 30.2 04/07/2019 1834   MCHC 32.1 04/07/2019 1834   RDW 12.1 04/07/2019 1834   LYMPHSABS 3.0 10/04/2018 1940   MONOABS 1.2 (H) 10/04/2018 1940   EOSABS 0.2 10/04/2018 1940   BASOSABS 0.1 10/04/2018 1940    No results found for: POCLITH, LITHIUM   No results found for: PHENYTOIN, PHENOBARB, VALPROATE, CBMZ   .res Assessment: Plan:    Plan:  1.  Adderall 20mg  TID 2. Ativan 1mg  TID  Continue therapy as needed  RTC 3 months  Wynona CanesChristine was seen today for anxiety, depression and adhd.  Diagnoses and all orders for this visit:  GAD (generalized anxiety disorder)  Major depressive disorder, remission status unspecified, unspecified whether recurrent  Attention  deficit hyperactivity disorder (ADHD), unspecified ADHD type -     amphetamine-dextroamphetamine (ADDERALL) 20 MG tablet; Take one tablet three times daily. -     amphetamine-dextroamphetamine (ADDERALL) 20 MG tablet; Take one tablet three times daily. -     amphetamine-dextroamphetamine (ADDERALL) 20 MG tablet; Take one tablet three times daily.  Generalized anxiety disorder -     LORazepam (ATIVAN) 1 MG tablet; Take one tablet TID as needed for anxiety.    Please see After Visit Summary for patient specific instructions.  Future Appointments  Date Time Provider Department Center  04/17/2019 10:30 AM Sater, Pearletha Furlichard A, MD [facility] None    No orders of the defined types were placed in this encounter.     -------------------------------

## 2019-04-17 ENCOUNTER — Encounter

## 2019-04-17 ENCOUNTER — Ambulatory Visit: Payer: Self-pay | Admitting: Neurology

## 2019-04-18 ENCOUNTER — Encounter: Payer: Self-pay | Admitting: Neurology

## 2019-04-29 DIAGNOSIS — R300 Dysuria: Secondary | ICD-10-CM | POA: Diagnosis not present

## 2019-04-29 DIAGNOSIS — N390 Urinary tract infection, site not specified: Secondary | ICD-10-CM | POA: Diagnosis not present

## 2019-05-02 DIAGNOSIS — K5641 Fecal impaction: Secondary | ICD-10-CM | POA: Diagnosis not present

## 2019-05-02 DIAGNOSIS — N83202 Unspecified ovarian cyst, left side: Secondary | ICD-10-CM | POA: Diagnosis not present

## 2019-05-02 DIAGNOSIS — K760 Fatty (change of) liver, not elsewhere classified: Secondary | ICD-10-CM | POA: Diagnosis not present

## 2019-05-02 DIAGNOSIS — Z79899 Other long term (current) drug therapy: Secondary | ICD-10-CM | POA: Diagnosis not present

## 2019-05-02 DIAGNOSIS — R300 Dysuria: Secondary | ICD-10-CM | POA: Diagnosis not present

## 2019-05-02 DIAGNOSIS — R1011 Right upper quadrant pain: Secondary | ICD-10-CM | POA: Diagnosis not present

## 2019-05-02 DIAGNOSIS — K5900 Constipation, unspecified: Secondary | ICD-10-CM | POA: Diagnosis not present

## 2019-05-02 DIAGNOSIS — R1031 Right lower quadrant pain: Secondary | ICD-10-CM | POA: Diagnosis not present

## 2019-06-09 DIAGNOSIS — F4320 Adjustment disorder, unspecified: Secondary | ICD-10-CM | POA: Diagnosis not present

## 2019-06-30 DIAGNOSIS — Z2089 Contact with and (suspected) exposure to other communicable diseases: Secondary | ICD-10-CM | POA: Diagnosis not present

## 2019-06-30 DIAGNOSIS — J029 Acute pharyngitis, unspecified: Secondary | ICD-10-CM | POA: Diagnosis not present

## 2019-07-24 ENCOUNTER — Ambulatory Visit: Payer: BC Managed Care – PPO | Admitting: Internal Medicine

## 2019-07-24 ENCOUNTER — Other Ambulatory Visit: Payer: Self-pay

## 2019-07-24 ENCOUNTER — Encounter: Payer: Self-pay | Admitting: Internal Medicine

## 2019-07-24 DIAGNOSIS — R002 Palpitations: Secondary | ICD-10-CM | POA: Diagnosis not present

## 2019-07-24 NOTE — Progress Notes (Signed)
HPI Christina [name] returns today after an over 3 year absence from our EP clinic. She is a pleasant 34 yo woman with 5 children who has autonomic dysfunction by history. She may or may not have SVT. She has never had an ECG in SVT but does note that her heart will race with exertion but at times when she is resting. She has had a pulse ox HR of over 200/min. She has dyspnea with exertion. No chest pain. She has very mild palpitations by history.  Allergies  Allergen Reactions  . Sulfa Antibiotics Hives, Itching and Swelling  . Latex Itching     Current Outpatient Medications  Medication Sig Dispense Refill  . amphetamine-dextroamphetamine (ADDERALL) 20 MG tablet Take one tablet three times daily. 90 tablet 0  . Biotin w/ Vitamins C & E (HAIR/SKIN/NAILS PO) Take 1 tablet by mouth daily.    . diazepam (VALIUM) 10 MG tablet Take 1 tablet (10 mg total) by mouth every 8 (eight) hours as needed (Muscle spasm). 20 tablet 0  . LORazepam (ATIVAN) 1 MG tablet Take one tablet TID as needed for anxiety. 90 tablet 2  . metoprolol tartrate (LOPRESSOR) 25 MG tablet TAKE 1 TABLET BY MOUTH TWICE A DAY 166 tablet 0  . Multiple Vitamin (MULTIVITAMIN WITH MINERALS) TABS tablet Take 1 tablet by mouth daily.    Marland Kitchen omega-3 acid ethyl esters (LOVAZA) 1 g capsule Take 1 g by mouth daily.     No current facility-administered medications for this visit.     Past Medical History:  Diagnosis Date  . Anemia    borderline anemic  . Anxiety   . Depression   . Eczema   . GERD (gastroesophageal reflux disease)    with pregnancy  . History of hip fracture 10/2010   still has pain, right hip  . Hyperemesis arising during pregnancy   . Increased heart rate    resting heart rate 100-120  . Low energy    on bedrest during pregnancy trying to regain strength  . SVD (spontaneous vaginal delivery) 12/16/2015  . SVT (supraventricular tachycardia) ([facility])   . UTI (lower urinary tract infection)   . Vaginitis and  vulvovaginitis     ROS:   All systems reviewed and negative except as noted in the HPI.   Past Surgical History:  Procedure Laterality Date  . LAPAROSCOPIC TUBAL LIGATION Bilateral 02/28/2016   Procedure: LAPAROSCOPIC TUBAL LIGATION;  Surgeon: Cheri Fowler, MD;  Location: [facility];  Service: Gynecology;  Laterality: Bilateral;  . LAPAROSCOPY Left 06/20/2012   Procedure: LAPAROSCOPY OPERATIVE;  Surgeon: Cheri Fowler, MD;  Location: [facility];  Service: Gynecology;  Laterality: Left;  OPEN LAPAROSCOPY, removal of portion of left ovarian cyst wall  . WISDOM TOOTH EXTRACTION       Family History  Problem Relation Age of Onset  . Alcohol abuse Mother   . Anxiety disorder Mother   . Asthma Mother   . Mental illness Mother        bipolar  . Depression Mother   . Hypothyroidism Mother   . Ovarian cysts Mother   . Hypertension Father   . Endometriosis Sister   . Ovarian cysts Sister   . Alcohol abuse Brother   . Crohn's disease Brother   . Alcohol abuse Maternal Aunt   . Cancer Maternal Aunt        breast  . Hypothyroidism Maternal Aunt   . Miscarriages / Stillbirths Maternal Aunt  stillbirth  . Alcohol abuse Maternal Uncle   . Asthma Maternal Grandmother   . Mental illness Maternal Grandmother        bipolar  . Asthma Maternal Grandfather   . Diabetes Maternal Grandfather   . Hypertension Maternal Grandfather   . Asthma Paternal Grandmother   . Hypertension Paternal Grandmother   . Asthma Paternal Grandfather   . Diabetes Paternal Grandfather   . Heart disease Paternal Grandfather   . Hypertension Paternal Grandfather      Social History   Socioeconomic History  . Marital status: Married    Spouse name: Not on file  . Number of children: Not on file  . Years of education: Not on file  . Highest education level: Not on file  Occupational History  . Not on file  Tobacco Use  . Smoking status: Never Smoker  . Smokeless tobacco: Never Used  Substance and  Sexual Activity  . Alcohol use: Yes    Comment: occ  . Drug use: No  . Sexual activity: Yes    Birth control/protection: None  Other Topics Concern  . Not on file  Social History Narrative  . Not on file   Social Determinants of Health   Financial Resource Strain:   . Difficulty of Paying Living Expenses:   Food Insecurity:   . Worried About Programme researcher, broadcasting/film/video in the Last Year:   . Barista in the Last Year:   Transportation Needs:   . Freight forwarder (Medical):   Marland Kitchen Lack of Transportation (Non-Medical):   Physical Activity:   . Days of Exercise per Week:   . Minutes of Exercise per Session:   Stress:   . Feeling of Stress :   Social Connections:   . Frequency of Communication with Friends and Family:   . Frequency of Social Gatherings with Friends and Family:   . Attends Religious Services:   . Active Member of Clubs or Organizations:   . Attends Banker Meetings:   Marland Kitchen Marital Status:   Intimate Partner Violence:   . Fear of Current or Ex-Partner:   . Emotionally Abused:   Marland Kitchen Physically Abused:   . Sexually Abused:      BP 108/68   Pulse 84   Resp 15   Ht 5\' 7"  (1.702 m)   Wt 231 lb (104.8 kg)   SpO2 97%   BMI 36.18 kg/m   Physical Exam:  Well appearing NAD HEENT: Unremarkable Neck:  No JVD, no thyromegally Lymphatics:  No adenopathy Back:  No CVA tenderness Lungs:  Clear with no wheezes HEART:  Regular rate rhythm, no murmurs, no rubs, no clicks Abd:  soft, positive bowel sounds, no organomegally, no rebound, no guarding Ext:  2 plus pulses, no edema, no cyanosis, no clubbing Skin:  No rashes no nodules Neuro:  CN II through XII intact, motor grossly intact  Assess/Plan: 1. Autonomic dysfunction - she has tried to improve her hydration and her symptoms have improved. I encouraged her to increase her salt intake.  2. Palpitations - her history may suggest SVT but could also be sinus tachycardia. I have asked her to use her  apple watch to record her HR. If she has SVT and becomes bothered then EP study and ablation would be an option.  3. Obesity - she has hired a and begun an exercise program. We will follow.  Psychologist, educational.D.

## 2019-07-24 NOTE — Patient Instructions (Addendum)
Medication Instructions:  Your physician recommends that you continue on your current medications as directed. Please refer to the Current Medication list given to you today.  Labwork: None ordered.  Testing/Procedures: None ordered.  Follow-Up: Your physician wants you to follow-up in: January 2022 with Dr. Ladona Ridgel.   You will receive a reminder letter in the mail two months in advance. If you don't receive a letter, please call our office to schedule the follow-up appointment.   Any Other Special Instructions Will Be Listed Below (If Applicable).  If you need a refill on your cardiac medications before your next appointment, please call your pharmacy.

## 2019-07-27 DIAGNOSIS — Z20822 Contact with and (suspected) exposure to covid-19: Secondary | ICD-10-CM | POA: Diagnosis not present

## 2019-08-01 DIAGNOSIS — F4320 Adjustment disorder, unspecified: Secondary | ICD-10-CM | POA: Diagnosis not present

## 2019-08-04 ENCOUNTER — Other Ambulatory Visit: Payer: Self-pay | Admitting: Adult Health

## 2019-08-04 DIAGNOSIS — F411 Generalized anxiety disorder: Secondary | ICD-10-CM

## 2019-08-07 ENCOUNTER — Other Ambulatory Visit: Payer: Self-pay | Admitting: Adult Health

## 2019-08-07 DIAGNOSIS — F411 Generalized anxiety disorder: Secondary | ICD-10-CM

## 2019-08-07 MED ORDER — LORAZEPAM 1 MG PO TABS: ORAL_TABLET | ORAL | 2 refills | Status: DC

## 2019-08-07 NOTE — Telephone Encounter (Signed)
Last apt 04/2019, nothing scheduled 

## 2019-09-12 ENCOUNTER — Ambulatory Visit: Payer: BC Managed Care – PPO | Admitting: Adult Health

## 2019-09-13 ENCOUNTER — Telehealth (INDEPENDENT_AMBULATORY_CARE_PROVIDER_SITE_OTHER): Payer: BC Managed Care – PPO | Admitting: Adult Health

## 2019-09-13 ENCOUNTER — Encounter: Payer: Self-pay | Admitting: Adult Health

## 2019-09-13 ENCOUNTER — Telehealth: Payer: Self-pay | Admitting: Adult Health

## 2019-09-13 DIAGNOSIS — F329 Major depressive disorder, single episode, unspecified: Secondary | ICD-10-CM

## 2019-09-13 DIAGNOSIS — F411 Generalized anxiety disorder: Secondary | ICD-10-CM | POA: Diagnosis not present

## 2019-09-13 DIAGNOSIS — F909 Attention-deficit hyperactivity disorder, unspecified type: Secondary | ICD-10-CM

## 2019-09-13 NOTE — Progress Notes (Signed)
Christina [name] [id number] May 22, 1985 34 y.o.  Virtual Visit via Video Note  I connected with pt @ on 09/13/19 at  2:00 PM EDT by a video enabled telemedicine application and verified that I am speaking with the correct person using two identifiers.   I discussed the limitations of evaluation and management by telemedicine and the availability of in person appointments. The patient expressed understanding and agreed to proceed.  I discussed the assessment and treatment plan with the patient. The patient was provided an opportunity to ask questions and all were answered. The patient agreed with the plan and demonstrated an understanding of the instructions.   The patient was advised to call back or seek an in-person evaluation if the symptoms worsen or if the condition fails to improve as anticipated.  I provided 30 minutes of non-face-to-face time during this encounter.  The patient was located at home.  The provider was located at [place].   Aloha Gell, NP   Subjective:   Patient ID:  Christina [name] is a 34 y.o. (DOB [birth year]) female.  Chief Complaint: No chief complaint on file.  HPI Christina TWITTY presents for follow-up of GAD, MDD, and ADHD.  Describes mood today as "ok". Pleasant. Mood symptoms - denies depression. Stating "things are going good". Continues to feel anxious and irritable. Stating "I have accepted this is who I am". Does not feel like there is a "magic pill" that will help me. Stable interest and motivation. Taking medications as prescribed.  Energy levels improved. Active, has a regular exercise routine.  Enjoys some usual interests and activities. Single mother - lives with 3 children. Appetite adequate. Weight stable - 225 pounds.  Sleeps better some nights than others. Averages 5 nights of good sleep and has 2 off days. Taking Benadryl prn sleep. Focus and concentration stable. Completing tasks. Managing aspects of household. 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  Denies SI or HI. Denies AH or VH.   Review of Systems:  Review of Systems  Musculoskeletal: Negative for gait problem.  Neurological: Negative for tremors.  Psychiatric/Behavioral:       Please refer to HPI    Medications: I have reviewed the patient's current medications.  Current Outpatient Medications  Medication Sig Dispense Refill  . amphetamine-dextroamphetamine (ADDERALL) 20 MG tablet Take one tablet three times daily. 90 tablet 0  . LORazepam (ATIVAN) 1 MG tablet TAKE ONE TABLET 3 TIMES A DAY AS NEEDED FOR ANXIETY. 90 tablet 2  . metoprolol tartrate (LOPRESSOR) 25 MG tablet TAKE 1 TABLET BY MOUTH TWICE A DAY 166 tablet 0  . Multiple Vitamin (MULTIVITAMIN WITH MINERALS) TABS tablet Take 1 tablet by mouth daily.     No current facility-administered medications for this visit.    Medication Side Effects: None  Allergies:  Allergies  Allergen Reactions  . Sulfa Antibiotics Hives, Itching and Swelling  . Latex Itching    Past Medical History:  Diagnosis Date  . Anemia    borderline anemic  . Anxiety   . Depression   . Eczema   . GERD (gastroesophageal reflux disease)    with pregnancy  . History of hip fracture 10/2010   still has pain, right hip  . Hyperemesis arising during pregnancy   . Increased heart rate    resting heart rate 100-120  . Low energy    on bedrest during pregnancy trying to regain strength  . SVD (spontaneous vaginal delivery) 12/16/2015  . SVT (supraventricular tachycardia) (  [facility])   . UTI (lower urinary tract infection)   . Vaginitis and vulvovaginitis     Family History  Problem Relation Age of Onset  . Alcohol abuse Mother   . Anxiety disorder Mother   . Asthma Mother   . Mental illness Mother        bipolar  . Depression Mother   . Hypothyroidism Mother   . Ovarian cysts Mother   . Hypertension Father   . Endometriosis Sister   . Ovarian cysts Sister   . Alcohol abuse Brother   . Crohn's  disease Brother   . Alcohol abuse Maternal Aunt   . Cancer Maternal Aunt        breast  . Hypothyroidism Maternal Aunt   . Miscarriages / Stillbirths Maternal Aunt        stillbirth  . Alcohol abuse Maternal Uncle   . Asthma Maternal Grandmother   . Mental illness Maternal Grandmother        bipolar  . Asthma Maternal Grandfather   . Diabetes Maternal Grandfather   . Hypertension Maternal Grandfather   . Asthma Paternal Grandmother   . Hypertension Paternal Grandmother   . Asthma Paternal Grandfather   . Diabetes Paternal Grandfather   . Heart disease Paternal Grandfather   . Hypertension Paternal Grandfather     Social History   Socioeconomic History  . Marital status: Married    Spouse name: Not on file  . Number of children: Not on file  . Years of education: Not on file  . Highest education level: Not on file  Occupational History  . Not on file  Tobacco Use  . Smoking status: Never Smoker  . Smokeless tobacco: Never Used  Substance and Sexual Activity  . Alcohol use: Yes    Comment: occ  . Drug use: No  . Sexual activity: Yes    Birth control/protection: None  Other Topics Concern  . Not on file  Social History Narrative  . Not on file   Social Determinants of Health   Financial Resource Strain:   . Difficulty of Paying Living Expenses:   Food Insecurity:   . Worried About Programme researcher, broadcasting/film/video in the Last Year:   . Barista in the Last Year:   Transportation Needs:   . Freight forwarder (Medical):   Marland Kitchen Lack of Transportation (Non-Medical):   Physical Activity:   . Days of Exercise per Week:   . Minutes of Exercise per Session:   Stress:   . Feeling of Stress :   Social Connections:   . Frequency of Communication with Friends and Family:   . Frequency of Social Gatherings with Friends and Family:   . Attends Religious Services:   . Active Member of Clubs or Organizations:   . Attends Banker Meetings:   Marland Kitchen Marital Status:    Intimate Partner Violence:   . Fear of Current or Ex-Partner:   . Emotionally Abused:   Marland Kitchen Physically Abused:   . Sexually Abused:     Past Medical History, Surgical history, Social history, and Family history were reviewed and updated as appropriate.   Please see review of systems for further details on the patient's review from today.   Objective:   Physical Exam:  There were no vitals taken for this visit.  Physical Exam Neurological:     Mental Status: She is alert and oriented to person, place, and time.     Cranial Nerves: No dysarthria.  Psychiatric:        Attention and Perception: Attention and perception normal.        Mood and Affect: Mood normal.        Speech: Speech normal.        Behavior: Behavior is cooperative.        Thought Content: Thought content normal. Thought content is not paranoid or delusional. Thought content does not include homicidal or suicidal ideation. Thought content does not include homicidal or suicidal plan.        Cognition and Memory: Cognition and memory normal.        Judgment: Judgment normal.     Comments: Insight intact     Lab Review:     Component Value Date/Time   NA 137 04/07/2019 1834   K 3.8 04/07/2019 1834   CL 104 04/07/2019 1834   CO2 22 04/07/2019 1834   GLUCOSE 94 04/07/2019 1834   BUN 19 04/07/2019 1834   CREATININE 0.63 04/07/2019 1834   CALCIUM 9.1 04/07/2019 1834   PROT 7.1 10/04/2018 1940   ALBUMIN 4.1 10/04/2018 1940   AST 26 10/04/2018 1940   ALT 37 10/04/2018 1940   ALKPHOS 52 10/04/2018 1940   BILITOT 0.3 10/04/2018 1940   GFRNONAA >60 04/07/2019 1834   GFRAA >60 04/07/2019 1834       Component Value Date/Time   WBC 13.1 (H) 04/07/2019 1834   RBC 4.27 04/07/2019 1834   HGB 12.9 04/07/2019 1834   HCT 40.2 04/07/2019 1834   PLT 317 04/07/2019 1834   MCV 94.1 04/07/2019 1834   MCH 30.2 04/07/2019 1834   MCHC 32.1 04/07/2019 1834   RDW 12.1 04/07/2019 1834   LYMPHSABS 3.0 10/04/2018 1940    MONOABS 1.2 (H) 10/04/2018 1940   EOSABS 0.2 10/04/2018 1940   BASOSABS 0.1 10/04/2018 1940    No results found for: POCLITH, LITHIUM   No results found for: PHENYTOIN, PHENOBARB, VALPROATE, CBMZ   .res Assessment: Plan:    Plan:  1. Adderall 10mg  TID 2. Ativan 1mg  TID  Continue therapy as needed  RTC 3 months  Discussed potential benefits, risk, and side effects of benzodiazepines to include potential risk of tolerance and dependence, as well as possible drowsiness.  Advised patient not to drive if experiencing drowsiness and to take lowest possible effective dose to minimize risk of dependence and tolerance.  Discussed potential benefits, risks, and side effects of stimulants with patient to include increased heart rate, palpitations, insomnia, increased anxiety, increased irritability, or decreased appetite.  Instructed patient to contact office if experiencing any significant tolerability issues.   Diagnoses and all orders for this visit:  GAD (generalized anxiety disorder)  Major depressive disorder, remission status unspecified, unspecified whether recurrent  Attention deficit hyperactivity disorder (ADHD), unspecified ADHD type     Please see After Visit Summary for patient specific instructions.  No future appointments.  No orders of the defined types were placed in this encounter.     -------------------------------

## 2019-09-13 NOTE — Telephone Encounter (Signed)
Ms. kimmberly, wisser are scheduled for a virtual visit with your provider today.    Just as we do with appointments in the office, we must obtain your consent to participate.  Your consent will be active for this visit and any virtual visit you may have with one of our providers in the next 365 days.    If you have a MyChart account, I can also send a copy of this consent to you electronically.  All virtual visits are billed to your insurance company just like a traditional visit in the office.  As this is a virtual visit, video technology does not allow for your provider to perform a traditional examination.  This may limit your provider's ability to fully assess your condition.  If your provider identifies any concerns that need to be evaluated in person or the need to arrange testing such as labs, EKG, etc, we will make arrangements to do so.    Although advances in technology are sophisticated, we cannot ensure that it will always work on either your end or our end.  If the connection with a video visit is poor, we may have to switch to a telephone visit.  With either a video or telephone visit, we are not always able to ensure that we have a secure connection.   I need to obtain your verbal consent now.   Are you willing to proceed with your visit today?   CLARK CLOWDUS has provided verbal consent on 09/13/2019 for a virtual visit (video or telephone).   Lennie Muckle Edmond, NP 09/13/2019  2:07 PM

## 2019-09-19 ENCOUNTER — Telehealth: Payer: Self-pay | Admitting: Adult Health

## 2019-09-19 ENCOUNTER — Other Ambulatory Visit: Payer: Self-pay | Admitting: Adult Health

## 2019-09-19 DIAGNOSIS — G4700 Insomnia, unspecified: Secondary | ICD-10-CM

## 2019-09-19 DIAGNOSIS — F4320 Adjustment disorder, unspecified: Secondary | ICD-10-CM | POA: Diagnosis not present

## 2019-09-19 MED ORDER — TRAZODONE HCL 50 MG PO TABS: ORAL_TABLET | ORAL | 0 refills | Status: DC

## 2019-09-19 NOTE — Telephone Encounter (Signed)
Can we call and get more details. 

## 2019-09-19 NOTE — Telephone Encounter (Signed)
I sent in Trazadone 50mg  - 1/2 to 1 tablet for sleep. Has only slept 2 hours for past three nights. Advised to call with update in the morning.

## 2019-09-19 NOTE — Telephone Encounter (Signed)
Pt not doing well she said that her anxiety is high and she has not slept in 3 days. Please call.

## 2019-09-21 ENCOUNTER — Telehealth (INDEPENDENT_AMBULATORY_CARE_PROVIDER_SITE_OTHER): Payer: BC Managed Care – PPO | Admitting: Adult Health

## 2019-09-21 DIAGNOSIS — G4700 Insomnia, unspecified: Secondary | ICD-10-CM

## 2019-09-21 DIAGNOSIS — F411 Generalized anxiety disorder: Secondary | ICD-10-CM | POA: Diagnosis not present

## 2019-09-21 DIAGNOSIS — F4320 Adjustment disorder, unspecified: Secondary | ICD-10-CM | POA: Diagnosis not present

## 2019-09-21 DIAGNOSIS — F329 Major depressive disorder, single episode, unspecified: Secondary | ICD-10-CM | POA: Diagnosis not present

## 2019-09-21 NOTE — Progress Notes (Signed)
Christina [name] [id number] June 21, 1985 34 y.o.  Virtual Visit via Telephone Note  I connected with pt on 09/21/19 at  5:00 PM EDT by telephone and verified that I am speaking with the correct person using two identifiers.   I discussed the limitations, risks, security and privacy concerns of performing an evaluation and management service by telephone and the availability of in person appointments. I also discussed with the patient that there may be a patient responsible charge related to this service. The patient expressed understanding and agreed to proceed.   I discussed the assessment and treatment plan with the patient. The patient was provided an opportunity to ask questions and all were answered. The patient agreed with the plan and demonstrated an understanding of the instructions.   The patient was advised to call back or seek an in-person evaluation if the symptoms worsen or if the condition fails to improve as anticipated.  I provided 30 minutes of non-face-to-face time during this encounter.  The patient was located at home.  The provider was located at [facility].   Dorothyann Gibbs, NP   Subjective:   Patient ID:  Christina [name] is a 34 y.o. (DOB 1985/07/03) female.  Chief Complaint: No chief complaint on file.   HPI Christina [name] presents for follow-up of GAD, MDD, and ADHD.  Describes mood today as "not good". Tearful. Mood symptoms - reports increased anxiety and panic attacks that started earlier in the week when patient found out that a mother of one of the girls in her daughters girl scout group was having inappropriate thoughts about her daughter and relayed this to patient via text messaging. Has been having panic attacks, heart palpitations, and did not sleep for 3 days. Stating "I haven't found a way to get past this". Working with therapist. Stating "I want justice and I want to fight back". Speaking with attorney tomorrow.  Energy levels decreased  with lack of sleep. Active, typically has a regular exercise routine.  Enjoys some usual interests and activities. Single mother - lives with 3 children. Appetite adequate. Weight stable. Sleeping difficulties. Did not sleep for 3 days. Started on Trazadone 50mg  2 nights ago. Slept 5 hours the first night and 4 hours the second night. Feels more rested - "still very anxious".  Focus and concentration stable. Completing tasks. Managing aspects of household.  Denies SI or HI. Denies AH or VH.   Review of Systems:  Review of Systems  Musculoskeletal: Negative for gait problem.  Neurological: Negative for tremors.  Psychiatric/Behavioral:       Please refer to HPI    Medications: I have reviewed the patient's current medications.  Current Outpatient Medications  Medication Sig Dispense Refill  . amphetamine-dextroamphetamine (ADDERALL) 20 MG tablet Take one tablet three times daily. 90 tablet 0  . LORazepam (ATIVAN) 1 MG tablet TAKE ONE TABLET 3 TIMES A DAY AS NEEDED FOR ANXIETY. 90 tablet 2  . metoprolol tartrate (LOPRESSOR) 25 MG tablet TAKE 1 TABLET BY MOUTH TWICE A DAY 166 tablet 0  . Multiple Vitamin (MULTIVITAMIN WITH MINERALS) TABS tablet Take 1 tablet by mouth daily.    . traZODone (DESYREL) 50 MG tablet Take 1/2 to one tablet at bedtime for sleep. 30 tablet 0   No current facility-administered medications for this visit.    Medication Side Effects: None  Allergies:  Allergies  Allergen Reactions  . Sulfa Antibiotics Hives, Itching and Swelling  . Latex Itching    Past Medical History:  Diagnosis Date  . Anemia    borderline anemic  . Anxiety   . Depression   . Eczema   . GERD (gastroesophageal reflux disease)    with pregnancy  . History of hip fracture 10/2010   still has pain, right hip  . Hyperemesis arising during pregnancy   . Increased heart rate    resting heart rate 100-120  . Low energy    on bedrest during pregnancy trying to regain strength  . SVD  (spontaneous vaginal delivery) 12/16/2015  . SVT (supraventricular tachycardia) (HCC)   . UTI (lower urinary tract infection)   . Vaginitis and vulvovaginitis     Family History  Problem Relation Age of Onset  . Alcohol abuse Mother   . Anxiety disorder Mother   . Asthma Mother   . Mental illness Mother        bipolar  . Depression Mother   . Hypothyroidism Mother   . Ovarian cysts Mother   . Hypertension Father   . Endometriosis Sister   . Ovarian cysts Sister   . Alcohol abuse Brother   . Crohn's disease Brother   . Alcohol abuse Maternal Aunt   . Cancer Maternal Aunt        breast  . Hypothyroidism Maternal Aunt   . Miscarriages / Stillbirths Maternal Aunt        stillbirth  . Alcohol abuse Maternal Uncle   . Asthma Maternal Grandmother   . Mental illness Maternal Grandmother        bipolar  . Asthma Maternal Grandfather   . Diabetes Maternal Grandfather   . Hypertension Maternal Grandfather   . Asthma Paternal Grandmother   . Hypertension Paternal Grandmother   . Asthma Paternal Grandfather   . Diabetes Paternal Grandfather   . Heart disease Paternal Grandfather   . Hypertension Paternal Grandfather     Social History   Socioeconomic History  . Marital status: Married    Spouse name: Not on file  . Number of children: Not on file  . Years of education: Not on file  . Highest education level: Not on file  Occupational History  . Not on file  Tobacco Use  . Smoking status: Never Smoker  . Smokeless tobacco: Never Used  Substance and Sexual Activity  . Alcohol use: Yes    Comment: occ  . Drug use: No  . Sexual activity: Yes    Birth control/protection: None  Other Topics Concern  . Not on file  Social History Narrative  . Not on file   Social Determinants of Health   Financial Resource Strain:   . Difficulty of Paying Living Expenses:   Food Insecurity:   . Worried About Programme researcher, broadcasting/film/video in the Last Year:   . Barista in the Last Year:    Transportation Needs:   . Freight forwarder (Medical):   Marland Kitchen Lack of Transportation (Non-Medical):   Physical Activity:   . Days of Exercise per Week:   . Minutes of Exercise per Session:   Stress:   . Feeling of Stress :   Social Connections:   . Frequency of Communication with Friends and Family:   . Frequency of Social Gatherings with Friends and Family:   . Attends Religious Services:   . Active Member of Clubs or Organizations:   . Attends Banker Meetings:   Marland Kitchen Marital Status:   Intimate Partner Violence:   . Fear of Current or Ex-Partner:   .  Emotionally Abused:   Marland Kitchen Physically Abused:   . Sexually Abused:     Past Medical History, Surgical history, Social history, and Family history were reviewed and updated as appropriate.   Please see review of systems for further details on the patient's review from today.   Objective:   Physical Exam:  There were no vitals taken for this visit.  Physical Exam Neurological:     Mental Status: She is alert and oriented to person, place, and time.     Cranial Nerves: No dysarthria.  Psychiatric:        Attention and Perception: Attention and perception normal.        Mood and Affect: Mood is anxious.        Speech: Speech normal.        Behavior: Behavior is cooperative.        Thought Content: Thought content normal. Thought content is not paranoid or delusional. Thought content does not include homicidal or suicidal ideation. Thought content does not include homicidal or suicidal plan.        Cognition and Memory: Cognition and memory normal.        Judgment: Judgment normal.     Comments: Insight intact     Lab Review:     Component Value Date/Time   NA 137 04/07/2019 1834   K 3.8 04/07/2019 1834   CL 104 04/07/2019 1834   CO2 22 04/07/2019 1834   GLUCOSE 94 04/07/2019 1834   BUN 19 04/07/2019 1834   CREATININE 0.63 04/07/2019 1834   CALCIUM 9.1 04/07/2019 1834   PROT 7.1 10/04/2018 1940    ALBUMIN 4.1 10/04/2018 1940   AST 26 10/04/2018 1940   ALT 37 10/04/2018 1940   ALKPHOS 52 10/04/2018 1940   BILITOT 0.3 10/04/2018 1940   GFRNONAA >60 04/07/2019 1834   GFRAA >60 04/07/2019 1834       Component Value Date/Time   WBC 13.1 (H) 04/07/2019 1834   RBC 4.27 04/07/2019 1834   HGB 12.9 04/07/2019 1834   HCT 40.2 04/07/2019 1834   PLT 317 04/07/2019 1834   MCV 94.1 04/07/2019 1834   MCH 30.2 04/07/2019 1834   MCHC 32.1 04/07/2019 1834   RDW 12.1 04/07/2019 1834   LYMPHSABS 3.0 10/04/2018 1940   MONOABS 1.2 (H) 10/04/2018 1940   EOSABS 0.2 10/04/2018 1940   BASOSABS 0.1 10/04/2018 1940    No results found for: POCLITH, LITHIUM   No results found for: PHENYTOIN, PHENOBARB, VALPROATE, CBMZ   .res Assessment: Plan:    Plan:  1. Adderall 10mg  TID - not taking currently 2. Ativan 1mg  TID to four times daily for increased anxiety and panic attacks. 3. Trazadone 50mg  - take 2 tablets at bedtime - increased from onne tablet.  Continue therapy   RTC 2 weeks  Discussed potential benefits, risk, and side effects of benzodiazepines to include potential risk of tolerance and dependence, as well as possible drowsiness.  Advised patient not to drive if experiencing drowsiness and to take lowest possible effective dose to minimize risk of dependence and tolerance.  Discussed potential benefits, risks, and side effects of stimulants with patient to include increased heart rate, palpitations, insomnia, increased anxiety, increased irritability, or decreased appetite.  Instructed patient to contact office if experiencing any significant tolerability issues.     Diagnoses and all orders for this visit:  Insomnia, unspecified type  GAD (generalized anxiety disorder)  Major depressive disorder, remission status unspecified, unspecified whether recurrent    Please  see After Visit Summary for patient specific instructions.  No future appointments.  No orders of the  defined types were placed in this encounter.     -------------------------------

## 2019-09-26 DIAGNOSIS — F4320 Adjustment disorder, unspecified: Secondary | ICD-10-CM | POA: Diagnosis not present

## 2019-09-28 ENCOUNTER — Telehealth: Payer: Self-pay | Admitting: Adult Health

## 2019-09-28 ENCOUNTER — Other Ambulatory Visit: Payer: Self-pay | Admitting: Adult Health

## 2019-09-28 DIAGNOSIS — F428 Other obsessive-compulsive disorder: Secondary | ICD-10-CM

## 2019-09-28 DIAGNOSIS — G4700 Insomnia, unspecified: Secondary | ICD-10-CM

## 2019-09-28 DIAGNOSIS — F331 Major depressive disorder, recurrent, moderate: Secondary | ICD-10-CM

## 2019-09-28 DIAGNOSIS — F909 Attention-deficit hyperactivity disorder, unspecified type: Secondary | ICD-10-CM

## 2019-09-28 DIAGNOSIS — F411 Generalized anxiety disorder: Secondary | ICD-10-CM

## 2019-09-28 MED ORDER — FLUOXETINE HCL 10 MG PO CAPS: ORAL_CAPSULE | ORAL | 2 refills | Status: DC

## 2019-09-28 NOTE — Telephone Encounter (Signed)
Called patient - added Prozac 10mg  daily.

## 2019-09-28 NOTE — Telephone Encounter (Signed)
Christina [name] called to report that the sleep medication is helping.  She is getting 5-7 hours of sleep, but she is still experiencing panic attacks.  She has still been able to cope but it is affecting her ability function.  Please call to discuss options.

## 2019-09-28 NOTE — Telephone Encounter (Signed)
Can we get more details? Trazadone was added for sleep. Has Ativan for anxiety.

## 2019-09-29 NOTE — Telephone Encounter (Signed)
Noted thank you

## 2019-10-04 DIAGNOSIS — N3000 Acute cystitis without hematuria: Secondary | ICD-10-CM | POA: Diagnosis not present

## 2019-10-08 ENCOUNTER — Other Ambulatory Visit: Payer: Self-pay | Admitting: Adult Health

## 2019-10-08 DIAGNOSIS — G4700 Insomnia, unspecified: Secondary | ICD-10-CM

## 2019-10-09 NOTE — Telephone Encounter (Signed)
Taking 2 of the 50 mg tablets, change to 100 mg ?

## 2019-10-11 ENCOUNTER — Telehealth: Payer: Self-pay | Admitting: Adult Health

## 2019-10-11 NOTE — Telephone Encounter (Signed)
Patient aware to increase Prozac to 20 mg and call back with an update.

## 2019-10-11 NOTE — Telephone Encounter (Signed)
Patient was told to call in and let gina know how she was doing. Patrient called and said that she sees no change. She is crying all the time and anxiety is high and waking up at 4  Or 5 am. Please call her at [phone]

## 2019-10-11 NOTE — Telephone Encounter (Signed)
If tolerating the Prozac at 10mg , may increase to 20mg  daily.

## 2019-10-17 ENCOUNTER — Telehealth: Payer: Self-pay | Admitting: Adult Health

## 2019-10-17 DIAGNOSIS — F4320 Adjustment disorder, unspecified: Secondary | ICD-10-CM | POA: Diagnosis not present

## 2019-10-17 NOTE — Telephone Encounter (Signed)
Letter complete. Pls review with patient before picking up.

## 2019-10-17 NOTE — Telephone Encounter (Signed)
Jenness called to report that her therapist recommended that she get a emotional/service dog.  She was told you would need to write a letter to support the need. If you have questions about it Dimples would be able to discuss it with you or you could also contact her Therapist.  She will pick up the letter when it is ready.

## 2019-10-19 ENCOUNTER — Other Ambulatory Visit: Payer: Self-pay | Admitting: Adult Health

## 2019-10-19 DIAGNOSIS — G4700 Insomnia, unspecified: Secondary | ICD-10-CM

## 2019-10-20 ENCOUNTER — Other Ambulatory Visit: Payer: Self-pay

## 2019-10-20 ENCOUNTER — Telehealth: Payer: Self-pay | Admitting: Adult Health

## 2019-10-20 DIAGNOSIS — G4700 Insomnia, unspecified: Secondary | ICD-10-CM

## 2019-10-20 MED ORDER — TRAZODONE HCL 100 MG PO TABS: ORAL_TABLET | ORAL | 1 refills | Status: DC

## 2019-10-20 NOTE — Telephone Encounter (Signed)
Pt said that Pharmacy will not fill her rx because they still have the old prescription in the system and it is to early to fill. Send new rx to [facility] on [address].

## 2019-10-20 NOTE — Telephone Encounter (Signed)
Dose increase of Trazodone to 50 mg 2 at hs per last office visit. Submitted updated Rx with Trazodone 100 mg 1 tablet at hs.

## 2019-10-21 ENCOUNTER — Other Ambulatory Visit: Payer: Self-pay | Admitting: Adult Health

## 2019-10-21 DIAGNOSIS — F411 Generalized anxiety disorder: Secondary | ICD-10-CM

## 2019-10-21 DIAGNOSIS — F428 Other obsessive-compulsive disorder: Secondary | ICD-10-CM

## 2019-10-24 DIAGNOSIS — F4320 Adjustment disorder, unspecified: Secondary | ICD-10-CM | POA: Diagnosis not present

## 2019-11-11 DIAGNOSIS — F4320 Adjustment disorder, unspecified: Secondary | ICD-10-CM | POA: Diagnosis not present

## 2019-11-13 ENCOUNTER — Other Ambulatory Visit: Payer: Self-pay | Admitting: Adult Health

## 2019-11-13 DIAGNOSIS — S46811A Strain of other muscles, fascia and tendons at shoulder and upper arm level, right arm, initial encounter: Secondary | ICD-10-CM | POA: Diagnosis not present

## 2019-11-13 DIAGNOSIS — M222X1 Patellofemoral disorders, right knee: Secondary | ICD-10-CM | POA: Diagnosis not present

## 2019-11-13 DIAGNOSIS — G4700 Insomnia, unspecified: Secondary | ICD-10-CM

## 2019-11-14 ENCOUNTER — Telehealth (INDEPENDENT_AMBULATORY_CARE_PROVIDER_SITE_OTHER): Payer: BC Managed Care – PPO | Admitting: Adult Health

## 2019-11-14 ENCOUNTER — Encounter: Payer: Self-pay | Admitting: Adult Health

## 2019-11-14 DIAGNOSIS — F428 Other obsessive-compulsive disorder: Secondary | ICD-10-CM

## 2019-11-14 DIAGNOSIS — F909 Attention-deficit hyperactivity disorder, unspecified type: Secondary | ICD-10-CM

## 2019-11-14 DIAGNOSIS — F411 Generalized anxiety disorder: Secondary | ICD-10-CM | POA: Diagnosis not present

## 2019-11-14 DIAGNOSIS — G4700 Insomnia, unspecified: Secondary | ICD-10-CM

## 2019-11-14 MED ORDER — FLUOXETINE HCL 20 MG PO CAPS: ORAL_CAPSULE | ORAL | 5 refills | Status: DC

## 2019-11-14 MED ORDER — AMPHETAMINE-DEXTROAMPHETAMINE 20 MG PO TABS: ORAL_TABLET | ORAL | 0 refills | Status: AC

## 2019-11-14 MED ORDER — LORAZEPAM 1 MG PO TABS: ORAL_TABLET | ORAL | 2 refills | Status: DC

## 2019-11-14 MED ORDER — AMPHETAMINE-DEXTROAMPHETAMINE 20 MG PO TABS: 20.0000 mg | ORAL_TABLET | Freq: Three times a day (TID) | ORAL | 0 refills | Status: DC

## 2019-11-14 MED ORDER — TRAZODONE HCL 100 MG PO TABS: ORAL_TABLET | ORAL | 5 refills | Status: DC

## 2019-11-14 NOTE — Progress Notes (Signed)
Christina HurterChristine W Welker [id number] 07/30/1985 34 y.o.  Virtual Visit via Video Note  I connected with pt @ on 11/14/19 at  8:20 AM EDT by a video enabled telemedicine application and verified that I am speaking with the correct person using two identifiers.   I discussed the limitations of evaluation and management by telemedicine and the availability of in person appointments. The patient expressed understanding and agreed to proceed.  I discussed the assessment and treatment plan with the patient. The patient was provided an opportunity to ask questions and all were answered. The patient agreed with the plan and demonstrated an understanding of the instructions.   The patient was advised to call back or seek an in-person evaluation if the symptoms worsen or if the condition fails to improve as anticipated.  I provided 30 minutes of non-face-to-face time during this encounter.  The patient was located at home.  The provider was located at [facility].   Dorothyann Gibbsegina N Sarye Kath, NP   Subjective:   Patient ID:  Christina [name] is a 34 y.o. (DOB 06/01/1985) female.  Chief Complaint: No chief complaint on file.   HPI Christina HurterChristine W Salva presents for follow-up of GAD, MDD, insomnia and ADHD.  Describes mood today as "ok". Mood symptoms - decreased anxiety, depression, and irritability. Reporting panic attacks "at times". Feels "calmer" overall. Stating "I feel better than I ever have". Medications working well. Decreased worry and rumination. Seeing therapist. Stable interest and motivation. Taking medications as prescribed.  Energy levels stable. Active, typically has a regular exercise routine.  Enjoys some usual interests and activities. Single. Lives alone with lives with 3 children. Appetite adequate. Weight stable. Sleeping well most nights - "like a champion". Averages 8 hours. Focus and concentration stable. Completing tasks. Managing aspects of household.  Denies SI or HI. Denies AH or  VH.    Review of Systems:  Review of Systems  Musculoskeletal: Negative for gait problem.  Neurological: Negative for tremors.  Psychiatric/Behavioral:       Please refer to HPI    Medications: I have reviewed the patient's current medications.  Current Outpatient Medications  Medication Sig Dispense Refill  . amphetamine-dextroamphetamine (ADDERALL) 20 MG tablet Take one tablet three times daily. 90 tablet 0  . [START ON 01/09/2020] amphetamine-dextroamphetamine (ADDERALL) 20 MG tablet Take 1 tablet (20 mg total) by mouth 3 (three) times daily. 90 tablet 0  . FLUoxetine (PROZAC) 20 MG capsule Take one capsule daily. 30 capsule 5  . loratadine (CLARITIN) 10 MG tablet Take by mouth.    Marland Kitchen. LORazepam (ATIVAN) 1 MG tablet TAKE ONE TABLET 4 TIMES A DAY AS NEEDED FOR ANXIETY. 120 tablet 2  . metoprolol tartrate (LOPRESSOR) 25 MG tablet TAKE 1 TABLET BY MOUTH TWICE A DAY 166 tablet 0  . Multiple Vitamin (MULTIVITAMIN WITH MINERALS) TABS tablet Take 1 tablet by mouth daily.    . traZODone (DESYREL) 100 MG tablet Take 1 tablet (100 mg) by mouth at bedtime for sleep 30 tablet 5   No current facility-administered medications for this visit.    Medication Side Effects: None  Allergies:  Allergies  Allergen Reactions  . Sulfa Antibiotics Hives, Itching and Swelling  . Latex Itching    Past Medical History:  Diagnosis Date  . Anemia    borderline anemic  . Anxiety   . Depression   . Eczema   . GERD (gastroesophageal reflux disease)    with pregnancy  . History of hip fracture 10/2010   still  has pain, right hip  . Hyperemesis arising during pregnancy   . Increased heart rate    resting heart rate 100-120  . Low energy    on bedrest during pregnancy trying to regain strength  . SVD (spontaneous vaginal delivery) 12/16/2015  . SVT (supraventricular tachycardia) (HCC)   . UTI (lower urinary tract infection)   . Vaginitis and vulvovaginitis     Family History  Problem Relation Age  of Onset  . Alcohol abuse Mother   . Anxiety disorder Mother   . Asthma Mother   . Mental illness Mother        bipolar  . Depression Mother   . Hypothyroidism Mother   . Ovarian cysts Mother   . Hypertension Father   . Endometriosis Sister   . Ovarian cysts Sister   . Alcohol abuse Brother   . Crohn's disease Brother   . Alcohol abuse Maternal Aunt   . Cancer Maternal Aunt        breast  . Hypothyroidism Maternal Aunt   . Miscarriages / Stillbirths Maternal Aunt        stillbirth  . Alcohol abuse Maternal Uncle   . Asthma Maternal Grandmother   . Mental illness Maternal Grandmother        bipolar  . Asthma Maternal Grandfather   . Diabetes Maternal Grandfather   . Hypertension Maternal Grandfather   . Asthma Paternal Grandmother   . Hypertension Paternal Grandmother   . Asthma Paternal Grandfather   . Diabetes Paternal Grandfather   . Heart disease Paternal Grandfather   . Hypertension Paternal Grandfather     Social History   Socioeconomic History  . Marital status: Married    Spouse name: Not on file  . Number of children: Not on file  . Years of education: Not on file  . Highest education level: Not on file  Occupational History  . Not on file  Tobacco Use  . Smoking status: Never Smoker  . Smokeless tobacco: Never Used  Vaping Use  . Vaping Use: Never used  Substance and Sexual Activity  . Alcohol use: Yes    Comment: occ  . Drug use: No  . Sexual activity: Yes    Birth control/protection: None  Other Topics Concern  . Not on file  Social History Narrative  . Not on file   Social Determinants of Health   Financial Resource Strain:   . Difficulty of Paying Living Expenses:   Food Insecurity:   . Worried About Programme researcher, broadcasting/film/video in the Last Year:   . Barista in the Last Year:   Transportation Needs:   . Freight forwarder (Medical):   Marland Kitchen Lack of Transportation (Non-Medical):   Physical Activity:   . Days of Exercise per Week:   .  Minutes of Exercise per Session:   Stress:   . Feeling of Stress :   Social Connections:   . Frequency of Communication with Friends and Family:   . Frequency of Social Gatherings with Friends and Family:   . Attends Religious Services:   . Active Member of Clubs or Organizations:   . Attends Banker Meetings:   Marland Kitchen Marital Status:   Intimate Partner Violence:   . Fear of Current or Ex-Partner:   . Emotionally Abused:   Marland Kitchen Physically Abused:   . Sexually Abused:     Past Medical History, Surgical history, Social history, and Family history were reviewed and updated as appropriate.  Please see review of systems for further details on the patient's review from today.   Objective:   Physical Exam:  There were no vitals taken for this visit.  Physical Exam Constitutional:      General: She is not in acute distress. Musculoskeletal:        General: No deformity.  Neurological:     Mental Status: She is alert and oriented to person, place, and time.     Coordination: Coordination normal.  Psychiatric:        Attention and Perception: Attention and perception normal. She does not perceive auditory or visual hallucinations.        Mood and Affect: Mood normal. Mood is not anxious or depressed. Affect is not labile, blunt, angry or inappropriate.        Speech: Speech normal.        Behavior: Behavior normal.        Thought Content: Thought content normal. Thought content is not paranoid or delusional. Thought content does not include homicidal or suicidal ideation. Thought content does not include homicidal or suicidal plan.        Cognition and Memory: Cognition and memory normal.        Judgment: Judgment normal.     Comments: Insight intact     Lab Review:     Component Value Date/Time   NA 137 04/07/2019 1834   K 3.8 04/07/2019 1834   CL 104 04/07/2019 1834   CO2 22 04/07/2019 1834   GLUCOSE 94 04/07/2019 1834   BUN 19 04/07/2019 1834   CREATININE 0.63  04/07/2019 1834   CALCIUM 9.1 04/07/2019 1834   PROT 7.1 10/04/2018 1940   ALBUMIN 4.1 10/04/2018 1940   AST 26 10/04/2018 1940   ALT 37 10/04/2018 1940   ALKPHOS 52 10/04/2018 1940   BILITOT 0.3 10/04/2018 1940   GFRNONAA >60 04/07/2019 1834   GFRAA >60 04/07/2019 1834       Component Value Date/Time   WBC 13.1 (H) 04/07/2019 1834   RBC 4.27 04/07/2019 1834   HGB 12.9 04/07/2019 1834   HCT 40.2 04/07/2019 1834   PLT 317 04/07/2019 1834   MCV 94.1 04/07/2019 1834   MCH 30.2 04/07/2019 1834   MCHC 32.1 04/07/2019 1834   RDW 12.1 04/07/2019 1834   LYMPHSABS 3.0 10/04/2018 1940   MONOABS 1.2 (H) 10/04/2018 1940   EOSABS 0.2 10/04/2018 1940   BASOSABS 0.1 10/04/2018 1940    No results found for: POCLITH, LITHIUM   No results found for: PHENYTOIN, PHENOBARB, VALPROATE, CBMZ   .res Assessment: Plan:    Plan:  1. Adderall 10mg  TID - not taking currently 2. Ativan 1mg  TID to four times daily for increased anxiety and panic attacks. 3. Trazadone 50mg  - take 2 tablets at bedtime - increased from onne tablet. 4. Prozac 10mg  daily  Continue therapy   RTC 2 weeks  Discussed potential benefits, risk, and side effects of benzodiazepines to include potential risk of tolerance and dependence, as well as possible drowsiness.  Advised patient not to drive if experiencing drowsiness and to take lowest possible effective dose to minimize risk of dependence and tolerance.  Discussed potential benefits, risks, and side effects of stimulants with patient to include increased heart rate, palpitations, insomnia, increased anxiety, increased irritability, or decreased appetite.  Instructed patient to contact office if experiencing any significant tolerability issues.    Diagnoses and all orders for this visit:  Generalized anxiety disorder -     FLUoxetine (  PROZAC) 20 MG capsule; Take one capsule daily. -     LORazepam (ATIVAN) 1 MG tablet; TAKE ONE TABLET 4 TIMES A DAY AS NEEDED FOR  ANXIETY.  Obsessional thoughts -     FLUoxetine (PROZAC) 20 MG capsule; Take one capsule daily.  Insomnia, unspecified type -     traZODone (DESYREL) 100 MG tablet; Take 1 tablet (100 mg) by mouth at bedtime for sleep  Attention deficit hyperactivity disorder (ADHD), unspecified ADHD type -     amphetamine-dextroamphetamine (ADDERALL) 20 MG tablet; Take one tablet three times daily. -     amphetamine-dextroamphetamine (ADDERALL) 20 MG tablet; Take 1 tablet (20 mg total) by mouth 3 (three) times daily.     Please see After Visit Summary for patient specific instructions.  No future appointments.  No orders of the defined types were placed in this encounter.     -------------------------------

## 2019-11-21 DIAGNOSIS — M25561 Pain in right knee: Secondary | ICD-10-CM | POA: Diagnosis not present

## 2019-12-01 DIAGNOSIS — S838X1A Sprain of other specified parts of right knee, initial encounter: Secondary | ICD-10-CM | POA: Diagnosis not present

## 2019-12-05 DIAGNOSIS — F4320 Adjustment disorder, unspecified: Secondary | ICD-10-CM | POA: Diagnosis not present

## 2019-12-07 ENCOUNTER — Other Ambulatory Visit: Payer: Self-pay | Admitting: Adult Health

## 2019-12-07 DIAGNOSIS — F411 Generalized anxiety disorder: Secondary | ICD-10-CM

## 2019-12-07 DIAGNOSIS — F428 Other obsessive-compulsive disorder: Secondary | ICD-10-CM

## 2019-12-17 DIAGNOSIS — X58XXXA Exposure to other specified factors, initial encounter: Secondary | ICD-10-CM | POA: Diagnosis not present

## 2019-12-17 DIAGNOSIS — S86811A Strain of other muscle(s) and tendon(s) at lower leg level, right leg, initial encounter: Secondary | ICD-10-CM | POA: Diagnosis not present

## 2019-12-17 DIAGNOSIS — M25561 Pain in right knee: Secondary | ICD-10-CM | POA: Diagnosis not present

## 2019-12-17 DIAGNOSIS — G8911 Acute pain due to trauma: Secondary | ICD-10-CM | POA: Diagnosis not present

## 2019-12-17 DIAGNOSIS — Z79899 Other long term (current) drug therapy: Secondary | ICD-10-CM | POA: Diagnosis not present

## 2019-12-18 ENCOUNTER — Other Ambulatory Visit: Payer: Self-pay | Admitting: Adult Health

## 2019-12-18 DIAGNOSIS — F411 Generalized anxiety disorder: Secondary | ICD-10-CM

## 2019-12-18 DIAGNOSIS — F428 Other obsessive-compulsive disorder: Secondary | ICD-10-CM

## 2019-12-20 DIAGNOSIS — M25561 Pain in right knee: Secondary | ICD-10-CM | POA: Diagnosis not present

## 2019-12-21 IMAGING — CR DG THORACIC SPINE 2V
3 series · 3 of 3 positions shown · non-contrast
Comparison: Chest radiograph 10/20/2014

CLINICAL DATA: Patient claims waking up at 4 am this morning and
stretching and felt and heard a pop in her middle thoracic spine.
Says its hurts worse when she lays flat or on her back. No previous
injury or surgery to area.

EXAM:
THORACIC SPINE 2 VIEWS

[t-spine ap]
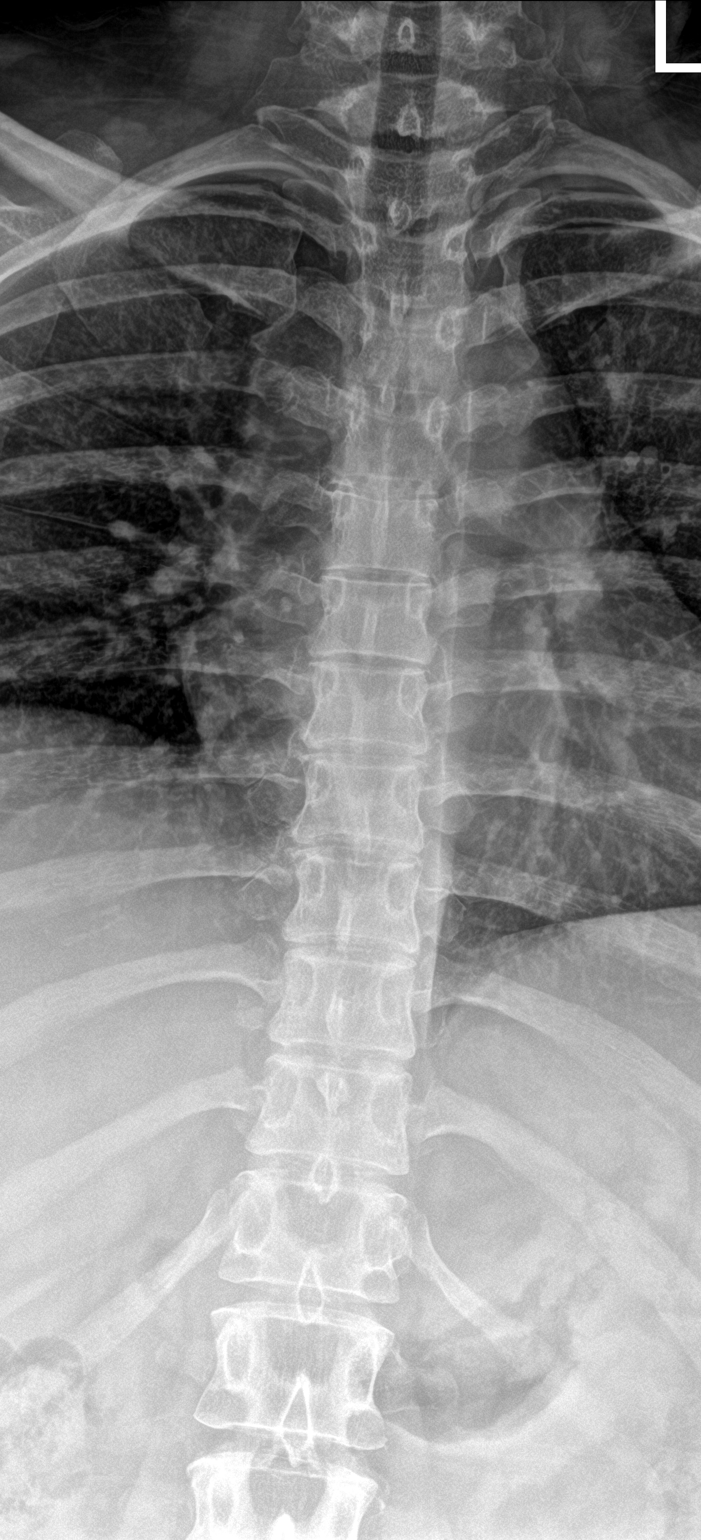

[t-spine lat]
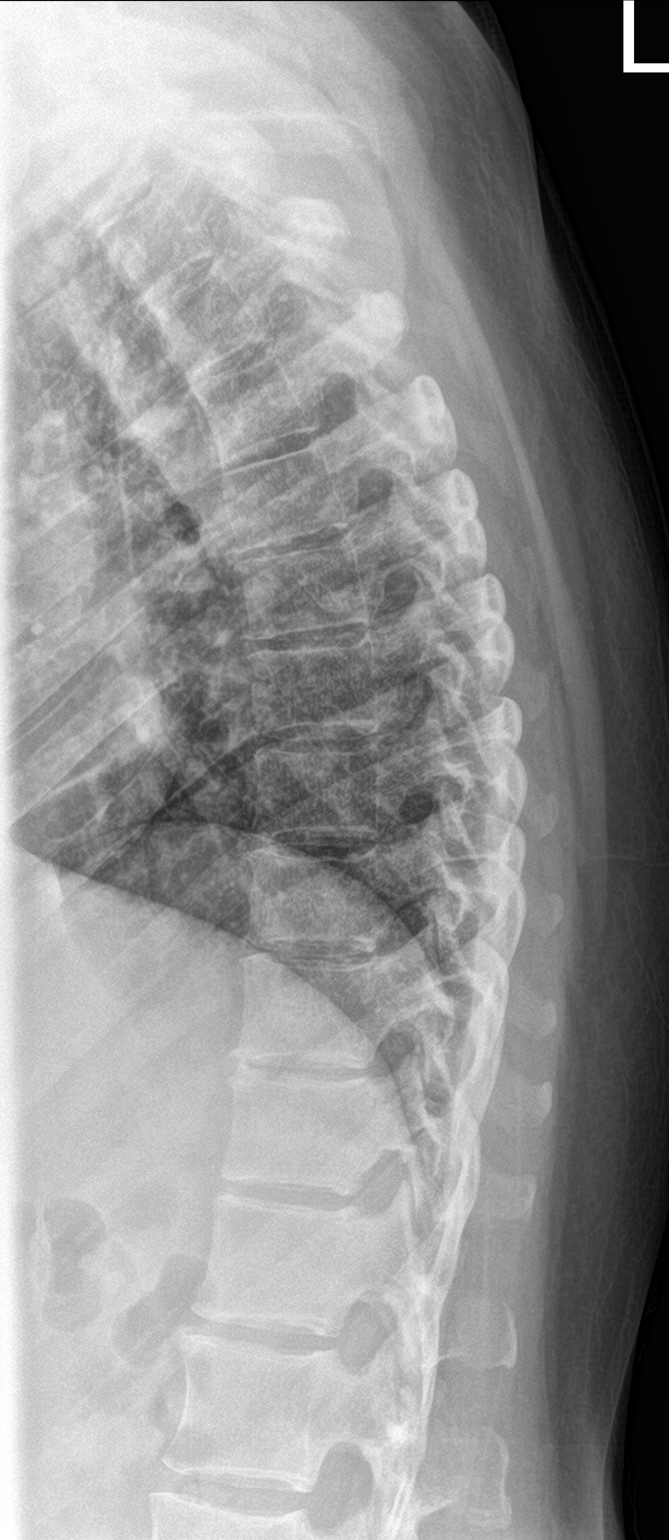

[t-spine swimmers]
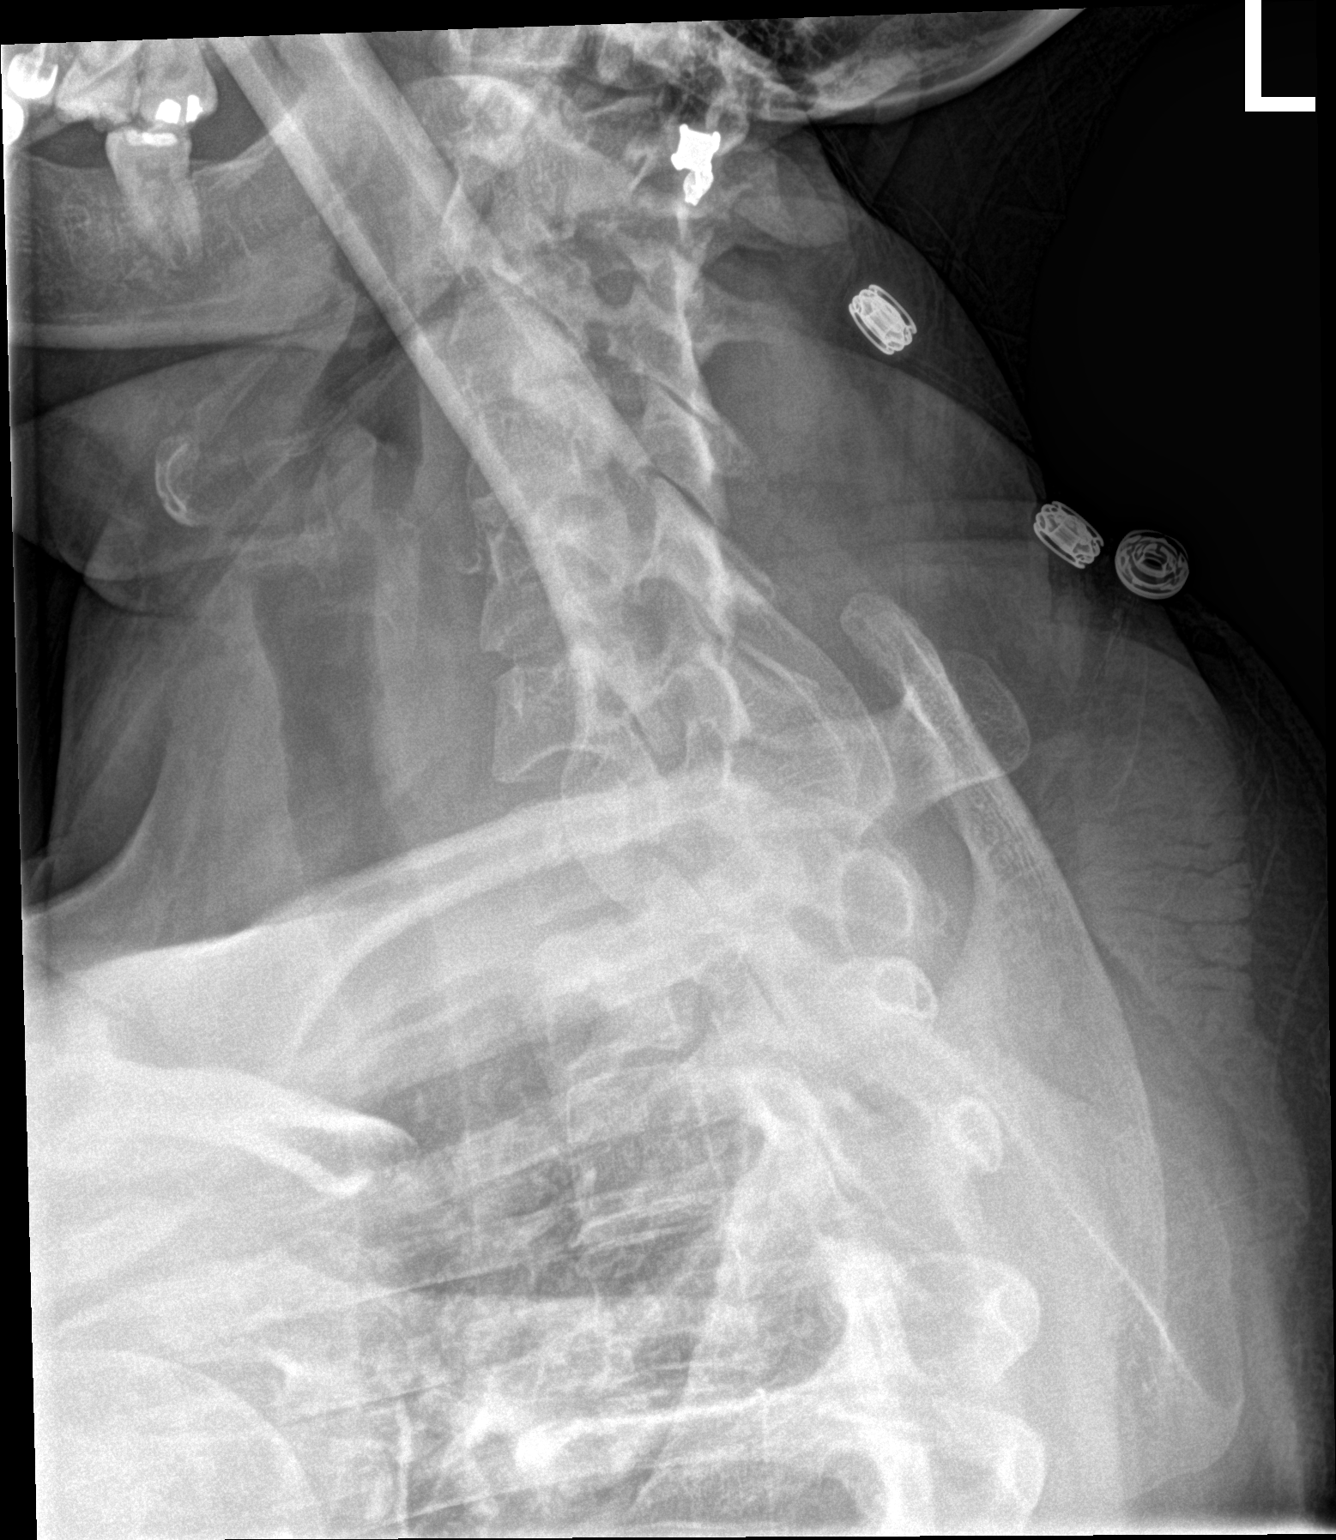

[3 of 3 positions shown; findings below may reference images not displayed]

FINDINGS: Normal paraspinous contours. Normal cervicothoracic junction on
swimmer's view. Lateral view images approximately T3 through L2.
Maintenance of vertebral body height and alignment across these
levels. Intervertebral disc heights are maintained.
IMPRESSION: No acute osseous abnormality.

## 2020-01-03 DIAGNOSIS — B354 Tinea corporis: Secondary | ICD-10-CM | POA: Diagnosis not present

## 2020-01-23 DIAGNOSIS — F4320 Adjustment disorder, unspecified: Secondary | ICD-10-CM | POA: Diagnosis not present

## 2020-01-27 DIAGNOSIS — J029 Acute pharyngitis, unspecified: Secondary | ICD-10-CM | POA: Diagnosis not present

## 2020-01-30 DIAGNOSIS — M25561 Pain in right knee: Secondary | ICD-10-CM | POA: Diagnosis not present

## 2020-02-20 DIAGNOSIS — M25561 Pain in right knee: Secondary | ICD-10-CM | POA: Diagnosis not present

## 2020-02-20 DIAGNOSIS — M6281 Muscle weakness (generalized): Secondary | ICD-10-CM | POA: Diagnosis not present

## 2020-02-26 DIAGNOSIS — M6281 Muscle weakness (generalized): Secondary | ICD-10-CM | POA: Diagnosis not present

## 2020-02-26 DIAGNOSIS — M25561 Pain in right knee: Secondary | ICD-10-CM | POA: Diagnosis not present

## 2020-02-29 DIAGNOSIS — M25561 Pain in right knee: Secondary | ICD-10-CM | POA: Diagnosis not present

## 2020-02-29 DIAGNOSIS — M6281 Muscle weakness (generalized): Secondary | ICD-10-CM | POA: Diagnosis not present

## 2020-03-05 ENCOUNTER — Encounter: Payer: Self-pay | Admitting: Adult Health

## 2020-03-05 ENCOUNTER — Telehealth (INDEPENDENT_AMBULATORY_CARE_PROVIDER_SITE_OTHER): Payer: BC Managed Care – PPO | Admitting: Adult Health

## 2020-03-05 DIAGNOSIS — F909 Attention-deficit hyperactivity disorder, unspecified type: Secondary | ICD-10-CM

## 2020-03-05 DIAGNOSIS — F411 Generalized anxiety disorder: Secondary | ICD-10-CM

## 2020-03-05 DIAGNOSIS — F428 Other obsessive-compulsive disorder: Secondary | ICD-10-CM

## 2020-03-05 DIAGNOSIS — G4700 Insomnia, unspecified: Secondary | ICD-10-CM | POA: Diagnosis not present

## 2020-03-05 MED ORDER — FLUOXETINE HCL 20 MG PO CAPS: ORAL_CAPSULE | ORAL | 2 refills | Status: DC

## 2020-03-05 MED ORDER — AMPHETAMINE-DEXTROAMPHETAMINE 20 MG PO TABS: 20.0000 mg | ORAL_TABLET | Freq: Three times a day (TID) | ORAL | 0 refills | Status: DC

## 2020-03-05 MED ORDER — LORAZEPAM 1 MG PO TABS: ORAL_TABLET | ORAL | 2 refills | Status: DC

## 2020-03-05 MED ORDER — TRAZODONE HCL 150 MG PO TABS: ORAL_TABLET | ORAL | 2 refills | Status: DC

## 2020-03-05 NOTE — Progress Notes (Signed)
Christina [name] [id number] 1986/02/23 34 y.o.  Virtual Visit via Video Note  I connected with pt @ on 03/05/20 at  9:00 AM EDT by a video enabled telemedicine application and verified that I am speaking with the correct person using two identifiers.   I discussed the limitations of evaluation and management by telemedicine and the availability of in person appointments. The patient expressed understanding and agreed to proceed.  I discussed the assessment and treatment plan with the patient. The patient was provided an opportunity to ask questions and all were answered. The patient agreed with the plan and demonstrated an understanding of the instructions.   The patient was advised to call back or seek an in-person evaluation if the symptoms worsen or if the condition fails to improve as anticipated.  I provided 30 minutes of non-face-to-face time during this encounter.  The patient was located at home.  The provider was located at [facility].   Dorothyann Gibbs, NP   Subjective:   Patient ID:  Christina [name] is a 34 y.o. (DOB [birth year]) female.  Chief Complaint: No chief complaint on file.   HPI Christina [name] presents for follow-up of GAD, MDD, insomnia and ADHD.  Describes mood today as "ok". Mood symptoms - decreased anxiety, depression, and irritability. Decreased worry and rumination. Stating "things are moving in a more positive direction". Feels like medications continue to work well for her. Addition of Prozac has been very helpful - "my mindset is much better". Seeing therapist. Stable interest and motivation. Taking medications as prescribed.  Energy levels stable. Active, has a regular exercise routine.  Enjoys some usual interests and activities. Single. Lives alone with lives with 3 children. Appetite adequate. Weight stable. Sleeping well most nights. Averages 5 hours with Trazadone 100mg  at hs. Focus and concentration stable with Adderall. Completing  tasks. Managing aspects of household.  Denies SI or HI. Denies AH or VH.   Review of Systems:  Review of Systems  Musculoskeletal: Negative for gait problem.  Neurological: Negative for tremors.  Psychiatric/Behavioral:       Please refer to HPI    Medications: I have reviewed the patient's current medications.  Current Outpatient Medications  Medication Sig Dispense Refill   amphetamine-dextroamphetamine (ADDERALL) 20 MG tablet Take one tablet three times daily. 90 tablet 0   amphetamine-dextroamphetamine (ADDERALL) 20 MG tablet Take 1 tablet (20 mg total) by mouth 3 (three) times daily. 90 tablet 0   [START ON 04/02/2020] amphetamine-dextroamphetamine (ADDERALL) 20 MG tablet Take 1 tablet (20 mg total) by mouth 3 (three) times daily. 90 tablet 0   [START ON 04/30/2020] amphetamine-dextroamphetamine (ADDERALL) 20 MG tablet Take 1 tablet (20 mg total) by mouth 3 (three) times daily. 90 tablet 0   FLUoxetine (PROZAC) 20 MG capsule TAKE 3 CAPSULES BY MOUTH EVERY DAY. 90 capsule 2   loratadine (CLARITIN) 10 MG tablet Take by mouth.     LORazepam (ATIVAN) 1 MG tablet TAKE ONE TABLET 4 TIMES A DAY AS NEEDED FOR ANXIETY. 120 tablet 2   metoprolol tartrate (LOPRESSOR) 25 MG tablet TAKE 1 TABLET BY MOUTH TWICE A DAY 166 tablet 0   Multiple Vitamin (MULTIVITAMIN WITH MINERALS) TABS tablet Take 1 tablet by mouth daily.     traZODone (DESYREL) 150 MG tablet Take 1 tablet by mouth at bedtime for sleep. 30 tablet 2   No current facility-administered medications for this visit.    Medication Side Effects: None  Allergies:  Allergies  Allergen Reactions  Sulfa Antibiotics Hives, Itching and Swelling   Latex Itching    Past Medical History:  Diagnosis Date   Anemia    borderline anemic   Anxiety    Depression    Eczema    GERD (gastroesophageal reflux disease)    with pregnancy   History of hip fracture 10/2010   still has pain, right hip   Hyperemesis arising  during pregnancy    Increased heart rate    resting heart rate 100-120   Low energy    on bedrest during pregnancy trying to regain strength   SVD (spontaneous vaginal delivery) 12/16/2015   SVT (supraventricular tachycardia) (HCC)    UTI (lower urinary tract infection)    Vaginitis and vulvovaginitis     Family History  Problem Relation Age of Onset   Alcohol abuse Mother    Anxiety disorder Mother    Asthma Mother    Mental illness Mother        bipolar   Depression Mother    Hypothyroidism Mother    Ovarian cysts Mother    Hypertension Father    Endometriosis Sister    Ovarian cysts Sister    Alcohol abuse Brother    Crohn's disease Brother    Alcohol abuse Maternal Aunt    Cancer Maternal Aunt        breast   Hypothyroidism Maternal Aunt    Miscarriages / Stillbirths Maternal Aunt        stillbirth   Alcohol abuse Maternal Uncle    Asthma Maternal Grandmother    Mental illness Maternal Grandmother        bipolar   Asthma Maternal Grandfather    Diabetes Maternal Grandfather    Hypertension Maternal Grandfather    Asthma Paternal Grandmother    Hypertension Paternal Grandmother    Asthma Paternal Grandfather    Diabetes Paternal Grandfather    Heart disease Paternal Grandfather    Hypertension Paternal Grandfather     Social History   Socioeconomic History   Marital status: Married    Spouse name: Not on file   Number of children: Not on file   Years of education: Not on file   Highest education level: Not on file  Occupational History   Not on file  Tobacco Use   Smoking status: Never Smoker   Smokeless tobacco: Never Used  Building services engineerVaping Use   Vaping Use: Never used  Substance and Sexual Activity   Alcohol use: Yes    Comment: occ   Drug use: No   Sexual activity: Yes    Birth control/protection: None  Other Topics Concern   Not on file  Social History Narrative   Not on file   Social Determinants of  Health   Financial Resource Strain:    Difficulty of Paying Living Expenses: Not on file  Food Insecurity:    Worried About Programme researcher, broadcasting/film/videounning Out of Food in the Last Year: Not on file   [employer] Out of Food in the Last Year: Not on file  Transportation Needs:    Lack of Transportation (Medical): Not on file   Lack of Transportation (Non-Medical): Not on file  Physical Activity:    Days of Exercise per Week: Not on file   Minutes of Exercise per Session: Not on file  Stress:    Feeling of Stress : Not on file  Social Connections:    Frequency of Communication with Friends and Family: Not on file   Frequency of Social Gatherings with Friends and  Family: Not on file   Attends Religious Services: Not on file   Active Member of Clubs or Organizations: Not on file   Attends Club or Organization Meetings: Not on file   Marital Status: Not on file  Intimate Partner Violence:    Fear of Current or Ex-Partner: Not on file   Emotionally Abused: Not on file   Physically Abused: Not on file   Sexually Abused: Not on file    Past Medical History, Surgical history, Social history, and Family history were reviewed and updated as appropriate.   Please see review of systems for further details on the patient's review from today.   Objective:   Physical Exam:  There were no vitals taken for this visit.  Physical Exam Constitutional:      General: She is not in acute distress. Musculoskeletal:        General: No deformity.  Neurological:     Mental Status: She is alert and oriented to person, place, and time.     Coordination: Coordination normal.  Psychiatric:        Attention and Perception: Attention and perception normal. She does not perceive auditory or visual hallucinations.        Mood and Affect: Mood normal. Mood is not anxious or depressed. Affect is not labile, blunt, angry or inappropriate.        Speech: Speech normal.        Behavior: Behavior normal.        Thought  Content: Thought content normal. Thought content is not paranoid or delusional. Thought content does not include homicidal or suicidal ideation. Thought content does not include homicidal or suicidal plan.        Cognition and Memory: Cognition and memory normal.        Judgment: Judgment normal.     Comments: Insight intact     Lab Review:     Component Value Date/Time   NA 137 04/07/2019 1834   K 3.8 04/07/2019 1834   CL 104 04/07/2019 1834   CO2 22 04/07/2019 1834   GLUCOSE 94 04/07/2019 1834   BUN 19 04/07/2019 1834   CREATININE 0.63 04/07/2019 1834   CALCIUM 9.1 04/07/2019 1834   PROT 7.1 10/04/2018 1940   ALBUMIN 4.1 10/04/2018 1940   AST 26 10/04/2018 1940   ALT 37 10/04/2018 1940   ALKPHOS 52 10/04/2018 1940   BILITOT 0.3 10/04/2018 1940   GFRNONAA >60 04/07/2019 1834   GFRAA >60 04/07/2019 1834       Component Value Date/Time   WBC 13.1 (H) 04/07/2019 1834   RBC 4.27 04/07/2019 1834   HGB 12.9 04/07/2019 1834   HCT 40.2 04/07/2019 1834   PLT 317 04/07/2019 1834   MCV 94.1 04/07/2019 1834   MCH 30.2 04/07/2019 1834   MCHC 32.1 04/07/2019 1834   RDW 12.1 04/07/2019 1834   LYMPHSABS 3.0 10/04/2018 1940   MONOABS 1.2 (H) 10/04/2018 1940   EOSABS 0.2 10/04/2018 1940   BASOSABS 0.1 10/04/2018 1940    No results found for: POCLITH, LITHIUM   No results found for: PHENYTOIN, PHENOBARB, VALPROATE, CBMZ   .res Assessment: Plan:    Plan:  1. Adderall 20mg  TID - will try taking 10mg  in the afternoon. 2. Ativan 1mg  four times daily for anxiety and panic attacks. 3. Trazadone 150mg  at hs - increase from 100mg . 4. Prozac 10mg  - 3 daily  Continue therapy   RTC 3 months  Discussed potential benefits, risk, and side effects of  benzodiazepines to include potential risk of tolerance and dependence, as well as possible drowsiness.  Advised patient not to drive if experiencing drowsiness and to take lowest possible effective dose to minimize risk of dependence and  tolerance.  Discussed potential benefits, risks, and side effects of stimulants with patient to include increased heart rate, palpitations, insomnia, increased anxiety, increased irritability, or decreased appetite.  Instructed patient to contact office if experiencing any significant tolerability issues.   Diagnoses and all orders for this visit:  Generalized anxiety disorder -     FLUoxetine (PROZAC) 20 MG capsule; TAKE 3 CAPSULES BY MOUTH EVERY DAY. -     LORazepam (ATIVAN) 1 MG tablet; TAKE ONE TABLET 4 TIMES A DAY AS NEEDED FOR ANXIETY.  Obsessional thoughts -     FLUoxetine (PROZAC) 20 MG capsule; TAKE 3 CAPSULES BY MOUTH EVERY DAY.  Insomnia, unspecified type -     traZODone (DESYREL) 150 MG tablet; Take 1 tablet by mouth at bedtime for sleep.  Attention deficit hyperactivity disorder (ADHD), unspecified ADHD type -     amphetamine-dextroamphetamine (ADDERALL) 20 MG tablet; Take 1 tablet (20 mg total) by mouth 3 (three) times daily. -     amphetamine-dextroamphetamine (ADDERALL) 20 MG tablet; Take 1 tablet (20 mg total) by mouth 3 (three) times daily. -     amphetamine-dextroamphetamine (ADDERALL) 20 MG tablet; Take 1 tablet (20 mg total) by mouth 3 (three) times daily.     Please see After Visit Summary for patient specific instructions.  No future appointments.  No orders of the defined types were placed in this encounter.     -------------------------------

## 2020-03-06 DIAGNOSIS — M6281 Muscle weakness (generalized): Secondary | ICD-10-CM | POA: Diagnosis not present

## 2020-03-06 DIAGNOSIS — M25561 Pain in right knee: Secondary | ICD-10-CM | POA: Diagnosis not present

## 2020-03-10 ENCOUNTER — Other Ambulatory Visit: Payer: Self-pay | Admitting: Adult Health

## 2020-03-10 DIAGNOSIS — F411 Generalized anxiety disorder: Secondary | ICD-10-CM

## 2020-03-10 DIAGNOSIS — F428 Other obsessive-compulsive disorder: Secondary | ICD-10-CM

## 2020-03-16 DIAGNOSIS — Z03818 Encounter for observation for suspected exposure to other biological agents ruled out: Secondary | ICD-10-CM | POA: Diagnosis not present

## 2020-03-16 DIAGNOSIS — Z1152 Encounter for screening for COVID-19: Secondary | ICD-10-CM | POA: Diagnosis not present

## 2020-03-16 DIAGNOSIS — B349 Viral infection, unspecified: Secondary | ICD-10-CM | POA: Diagnosis not present

## 2020-03-18 DIAGNOSIS — D485 Neoplasm of uncertain behavior of skin: Secondary | ICD-10-CM | POA: Diagnosis not present

## 2020-03-18 DIAGNOSIS — D225 Melanocytic nevi of trunk: Secondary | ICD-10-CM | POA: Diagnosis not present

## 2020-03-18 DIAGNOSIS — D224 Melanocytic nevi of scalp and neck: Secondary | ICD-10-CM | POA: Diagnosis not present

## 2020-03-18 DIAGNOSIS — L309 Dermatitis, unspecified: Secondary | ICD-10-CM | POA: Diagnosis not present

## 2020-03-27 ENCOUNTER — Other Ambulatory Visit: Payer: Self-pay | Admitting: Adult Health

## 2020-03-27 DIAGNOSIS — G4700 Insomnia, unspecified: Secondary | ICD-10-CM

## 2020-05-07 DIAGNOSIS — Z03818 Encounter for observation for suspected exposure to other biological agents ruled out: Secondary | ICD-10-CM | POA: Diagnosis not present

## 2020-05-07 DIAGNOSIS — R069 Unspecified abnormalities of breathing: Secondary | ICD-10-CM | POA: Diagnosis not present

## 2020-05-07 DIAGNOSIS — R059 Cough, unspecified: Secondary | ICD-10-CM | POA: Diagnosis not present

## 2020-06-02 DIAGNOSIS — J029 Acute pharyngitis, unspecified: Secondary | ICD-10-CM | POA: Diagnosis not present

## 2020-06-02 DIAGNOSIS — R509 Fever, unspecified: Secondary | ICD-10-CM | POA: Diagnosis not present

## 2020-06-02 DIAGNOSIS — R6889 Other general symptoms and signs: Secondary | ICD-10-CM | POA: Diagnosis not present

## 2020-06-02 DIAGNOSIS — R5383 Other fatigue: Secondary | ICD-10-CM | POA: Diagnosis not present

## 2020-06-02 DIAGNOSIS — R059 Cough, unspecified: Secondary | ICD-10-CM | POA: Diagnosis not present

## 2020-06-18 ENCOUNTER — Other Ambulatory Visit: Payer: Self-pay | Admitting: Internal Medicine

## 2020-06-18 DIAGNOSIS — R059 Cough, unspecified: Secondary | ICD-10-CM | POA: Diagnosis not present

## 2020-06-18 DIAGNOSIS — J40 Bronchitis, not specified as acute or chronic: Secondary | ICD-10-CM | POA: Diagnosis not present

## 2020-06-18 DIAGNOSIS — J329 Chronic sinusitis, unspecified: Secondary | ICD-10-CM | POA: Diagnosis not present

## 2020-07-10 ENCOUNTER — Other Ambulatory Visit: Payer: Self-pay | Admitting: Adult Health

## 2020-07-10 DIAGNOSIS — F411 Generalized anxiety disorder: Secondary | ICD-10-CM

## 2020-07-10 DIAGNOSIS — F428 Other obsessive-compulsive disorder: Secondary | ICD-10-CM

## 2020-07-14 ENCOUNTER — Other Ambulatory Visit: Payer: Self-pay | Admitting: Adult Health

## 2020-07-14 DIAGNOSIS — F411 Generalized anxiety disorder: Secondary | ICD-10-CM

## 2020-07-14 DIAGNOSIS — F428 Other obsessive-compulsive disorder: Secondary | ICD-10-CM

## 2020-07-15 NOTE — Telephone Encounter (Signed)
Please review

## 2020-08-01 ENCOUNTER — Telehealth: Payer: Self-pay | Admitting: Adult Health

## 2020-08-01 NOTE — Telephone Encounter (Signed)
Patient called after hours line to report a panic attack. Called back twice - no answer and unable to leave a voicemail. Called after hours number to report inability to reach patient. Called patient a 3rd time and was able to speak with patient.

## 2020-08-06 ENCOUNTER — Other Ambulatory Visit: Payer: Self-pay | Admitting: Adult Health

## 2020-08-06 DIAGNOSIS — F411 Generalized anxiety disorder: Secondary | ICD-10-CM

## 2020-08-06 DIAGNOSIS — F428 Other obsessive-compulsive disorder: Secondary | ICD-10-CM

## 2020-08-07 NOTE — Telephone Encounter (Signed)
Ok to send in script.

## 2020-08-07 NOTE — Telephone Encounter (Signed)
Pt has an appt tomorrow °

## 2020-08-07 NOTE — Telephone Encounter (Signed)
Last apt 03/2020 due 3 months      ADMIN: call pt for apt

## 2020-08-08 ENCOUNTER — Telehealth (INDEPENDENT_AMBULATORY_CARE_PROVIDER_SITE_OTHER): Payer: BC Managed Care – PPO | Admitting: Adult Health

## 2020-08-08 ENCOUNTER — Encounter: Payer: Self-pay | Admitting: Adult Health

## 2020-08-08 DIAGNOSIS — F428 Other obsessive-compulsive disorder: Secondary | ICD-10-CM

## 2020-08-08 DIAGNOSIS — F909 Attention-deficit hyperactivity disorder, unspecified type: Secondary | ICD-10-CM

## 2020-08-08 DIAGNOSIS — G4700 Insomnia, unspecified: Secondary | ICD-10-CM

## 2020-08-08 DIAGNOSIS — F411 Generalized anxiety disorder: Secondary | ICD-10-CM | POA: Diagnosis not present

## 2020-08-08 MED ORDER — FLUOXETINE HCL 10 MG PO CAPS: 10.0000 mg | ORAL_CAPSULE | Freq: Every day | ORAL | 3 refills | Status: DC

## 2020-08-08 MED ORDER — AMPHETAMINE-DEXTROAMPHETAMINE 20 MG PO TABS: 20.0000 mg | ORAL_TABLET | Freq: Three times a day (TID) | ORAL | 0 refills | Status: DC

## 2020-08-08 MED ORDER — TRAZODONE HCL 150 MG PO TABS: ORAL_TABLET | ORAL | 3 refills | Status: DC

## 2020-08-08 NOTE — Progress Notes (Signed)
Christina [name] [id number] 1986/03/27 35 y.o.  Subjective:   Patient ID:  Christina [name] is a 34 y.o. (DOB 11-01-1985) female.  Chief Complaint: No chief complaint on file.   HPI Christina [name] presents to the office today for follow-up of GAD, MDD, insomnia and ADHD.  Describes mood today as "ok". Mood symptoms - decreased anxiety, depression, and irritability. Decreased worry and rumination. Stating "I'm doing better". 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 Feels like medications continue to work well for her. Seeing therapist. Stable interest and motivation. Taking medications as prescribed.  Energy levels stable. Active, has a regular exercise routine.  Enjoys some usual interests and activities. Single. Lives alone with lives with 3 children. Appetite adequate. Weight stable. Sleeping well most nights. Averages 6 to 7 hours with Trazadone. Focus and concentration stable with Adderall. Completing tasks. Managing aspects of household.  Denies SI or HI.  Denies AH or VH      Review of Systems:  Review of Systems  Musculoskeletal: Negative for gait problem.  Neurological: Negative for tremors.  Psychiatric/Behavioral:       Please refer to HPI    Medications: I have reviewed the patient's current medications.  Current Outpatient Medications  Medication Sig Dispense Refill  . amphetamine-dextroamphetamine (ADDERALL) 20 MG tablet Take one tablet three times daily. 90 tablet 0  . amphetamine-dextroamphetamine (ADDERALL) 20 MG tablet Take 1 tablet (20 mg total) by mouth 3 (three) times daily. 90 tablet 0  . [START ON 09/05/2020] amphetamine-dextroamphetamine (ADDERALL) 20 MG tablet Take 1 tablet (20 mg total) by mouth 3 (three) times daily. 90 tablet 0  . [START ON 10/03/2020] amphetamine-dextroamphetamine (ADDERALL) 20 MG tablet Take 1 tablet (20 mg total) by mouth 3 (three) times daily. 90 tablet  0  . FLUoxetine (PROZAC) 10 MG capsule Take 1 capsule (10 mg total) by mouth daily. 270 capsule 3  . loratadine (CLARITIN) 10 MG tablet Take by mouth.    Marland Kitchen LORazepam (ATIVAN) 1 MG tablet TAKE ONE TABLET 4 TIMES A DAY AS NEEDED FOR ANXIETY. 120 tablet 2  . metoprolol tartrate (LOPRESSOR) 25 MG tablet TAKE 1 TABLET BY MOUTH TWICE A DAY. MAY TAKE AN EXTRA ONE TABLET AS NEEDED FOR INCREASED HEART RATE. Please make yearly appt with Dr. Ladona Ridgel for March 2022. 1st attempt 225 tablet 0  . Multiple Vitamin (MULTIVITAMIN WITH MINERALS) TABS tablet Take 1 tablet by mouth daily.    . traZODone (DESYREL) 150 MG tablet TAKE 1 TABLET BY MOUTH EVERY DAY AT BEDTIME FOR SLEEP 90 tablet 3   No current facility-administered medications for this visit.    Medication Side Effects: None  Allergies:  Allergies  Allergen Reactions  . Sulfa Antibiotics Hives, Itching and Swelling  . Latex Itching  . Polymyxin B-Trimethoprim     Other reaction(s): eye redness/pain    Past Medical History:  Diagnosis Date  . Anemia    borderline anemic  . Anxiety   . Depression   . Eczema   . GERD (gastroesophageal reflux disease)    with pregnancy  . History of hip fracture 10/2010   still has pain, right hip  . Hyperemesis arising during pregnancy   . Increased heart rate    resting heart rate 100-120  . Low energy    on bedrest during pregnancy trying to regain strength  . SVD (spontaneous vaginal delivery) 12/16/2015  . SVT (supraventricular tachycardia) (HCC)   .  UTI (lower urinary tract infection)   . Vaginitis and vulvovaginitis     Family History  Problem Relation Age of Onset  . Alcohol abuse Mother   . Anxiety disorder Mother   . Asthma Mother   . Mental illness Mother        bipolar  . Depression Mother   . Hypothyroidism Mother   . Ovarian cysts Mother   . Hypertension Father   . Endometriosis Sister   . Ovarian cysts Sister   . Alcohol abuse Brother   . Crohn's disease Brother   . Alcohol  abuse Maternal Aunt   . Cancer Maternal Aunt        breast  . Hypothyroidism Maternal Aunt   . Miscarriages / Stillbirths Maternal Aunt        stillbirth  . Alcohol abuse Maternal Uncle   . Asthma Maternal Grandmother   . Mental illness Maternal Grandmother        bipolar  . Asthma Maternal Grandfather   . Diabetes Maternal Grandfather   . Hypertension Maternal Grandfather   . Asthma Paternal Grandmother   . Hypertension Paternal Grandmother   . Asthma Paternal Grandfather   . Diabetes Paternal Grandfather   . Heart disease Paternal Grandfather   . Hypertension Paternal Grandfather     Social History   Socioeconomic History  . Marital status: Married    Spouse name: Not on file  . Number of children: Not on file  . Years of education: Not on file  . Highest education level: Not on file  Occupational History  . Not on file  Tobacco Use  . Smoking status: Never Smoker  . Smokeless tobacco: Never Used  Vaping Use  . Vaping Use: Never used  Substance and Sexual Activity  . Alcohol use: Yes    Comment: occ  . Drug use: No  . Sexual activity: Yes    Birth control/protection: None  Other Topics Concern  . Not on file  Social History Narrative  . Not on file   Social Determinants of Health   Financial Resource Strain: Not on file  Food Insecurity: Not on file  Transportation Needs: Not on file  Physical Activity: Not on file  Stress: Not on file  Social Connections: Not on file  Intimate Partner Violence: Not on file    Past Medical History, Surgical history, Social history, and Family history were reviewed and updated as appropriate.   Please see review of systems for further details on the patient's review from today.   Objective:   Physical Exam:  There were no vitals taken for this visit.  Physical Exam Constitutional:      General: She is not in acute distress. Musculoskeletal:        General: No deformity.  Neurological:     Mental Status: She is  alert and oriented to person, place, and time.     Coordination: Coordination normal.  Psychiatric:        Attention and Perception: Attention and perception normal. She does not perceive auditory or visual hallucinations.        Mood and Affect: Mood normal. Mood is not anxious or depressed. Affect is not labile, blunt, angry or inappropriate.        Speech: Speech normal.        Behavior: Behavior normal.        Thought Content: Thought content normal. Thought content is not paranoid or delusional. Thought content does not include homicidal or suicidal ideation.  Thought content does not include homicidal or suicidal plan.        Cognition and Memory: Cognition and memory normal.        Judgment: Judgment normal.     Comments: Insight intact     Lab Review:     Component Value Date/Time   NA 137 04/07/2019 1834   K 3.8 04/07/2019 1834   CL 104 04/07/2019 1834   CO2 22 04/07/2019 1834   GLUCOSE 94 04/07/2019 1834   BUN 19 04/07/2019 1834   CREATININE 0.63 04/07/2019 1834   CALCIUM 9.1 04/07/2019 1834   PROT 7.1 10/04/2018 1940   ALBUMIN 4.1 10/04/2018 1940   AST 26 10/04/2018 1940   ALT 37 10/04/2018 1940   ALKPHOS 52 10/04/2018 1940   BILITOT 0.3 10/04/2018 1940   GFRNONAA >60 04/07/2019 1834   GFRAA >60 04/07/2019 1834       Component Value Date/Time   WBC 13.1 (H) 04/07/2019 1834   RBC 4.27 04/07/2019 1834   HGB 12.9 04/07/2019 1834   HCT 40.2 04/07/2019 1834   PLT 317 04/07/2019 1834   MCV 94.1 04/07/2019 1834   MCH 30.2 04/07/2019 1834   MCHC 32.1 04/07/2019 1834   RDW 12.1 04/07/2019 1834   LYMPHSABS 3.0 10/04/2018 1940   MONOABS 1.2 (H) 10/04/2018 1940   EOSABS 0.2 10/04/2018 1940   BASOSABS 0.1 10/04/2018 1940    No results found for: POCLITH, LITHIUM   No results found for: PHENYTOIN, PHENOBARB, VALPROATE, CBMZ   .res Assessment: Plan:    Plan:   1. Adderall 20mg  TID - will try taking 10mg  in the afternoon. 2. Ativan 1mg  four times daily for  anxiety and panic attacks. 3. Trazadone 150mg  at hs  4. Prozac 10mg  - 3 daily  Continue therapy   RTC 3 months  Discussed potential benefits, risk, and side effects of benzodiazepines to include potential risk of tolerance and dependence, as well as possible drowsiness.  Advised patient not to drive if experiencing drowsiness and to take lowest possible effective dose to minimize risk of dependence and tolerance.  Discussed potential benefits, risks, and side effects of stimulants with patient to include increased heart rate, palpitations, insomnia, increased anxiety, increased irritability, or decreased appetite.  Instructed patient to contact office if experiencing any significant tolerability issues.    Diagnoses and all orders for this visit:  Insomnia, unspecified type -     traZODone (DESYREL) 150 MG tablet; TAKE 1 TABLET BY MOUTH EVERY DAY AT BEDTIME FOR SLEEP  Generalized anxiety disorder -     FLUoxetine (PROZAC) 10 MG capsule; Take 1 capsule (10 mg total) by mouth daily.  Obsessional thoughts -     FLUoxetine (PROZAC) 10 MG capsule; Take 1 capsule (10 mg total) by mouth daily.  Attention deficit hyperactivity disorder (ADHD), unspecified ADHD type -     amphetamine-dextroamphetamine (ADDERALL) 20 MG tablet; Take 1 tablet (20 mg total) by mouth 3 (three) times daily. -     amphetamine-dextroamphetamine (ADDERALL) 20 MG tablet; Take 1 tablet (20 mg total) by mouth 3 (three) times daily. -     amphetamine-dextroamphetamine (ADDERALL) 20 MG tablet; Take 1 tablet (20 mg total) by mouth 3 (three) times daily.     Please see After Visit Summary for patient specific instructions.  No future appointments.  No orders of the defined types were placed in this encounter.   -------------------------------

## 2020-08-30 DIAGNOSIS — R300 Dysuria: Secondary | ICD-10-CM | POA: Diagnosis not present

## 2020-08-30 DIAGNOSIS — R059 Cough, unspecified: Secondary | ICD-10-CM | POA: Diagnosis not present

## 2020-09-05 ENCOUNTER — Other Ambulatory Visit: Payer: Self-pay | Admitting: Adult Health

## 2020-09-05 DIAGNOSIS — F411 Generalized anxiety disorder: Secondary | ICD-10-CM

## 2020-09-06 NOTE — Telephone Encounter (Signed)
Last refill 03/09 #120 Due back in July

## 2020-09-26 DIAGNOSIS — M545 Low back pain, unspecified: Secondary | ICD-10-CM | POA: Diagnosis not present

## 2020-09-26 DIAGNOSIS — M25562 Pain in left knee: Secondary | ICD-10-CM | POA: Diagnosis not present

## 2020-09-26 DIAGNOSIS — M25552 Pain in left hip: Secondary | ICD-10-CM | POA: Diagnosis not present

## 2020-09-26 DIAGNOSIS — M25561 Pain in right knee: Secondary | ICD-10-CM | POA: Diagnosis not present

## 2020-09-26 DIAGNOSIS — M25551 Pain in right hip: Secondary | ICD-10-CM | POA: Diagnosis not present

## 2020-10-10 ENCOUNTER — Other Ambulatory Visit: Payer: Self-pay | Admitting: Adult Health

## 2020-10-10 DIAGNOSIS — F411 Generalized anxiety disorder: Secondary | ICD-10-CM

## 2020-10-10 DIAGNOSIS — F428 Other obsessive-compulsive disorder: Secondary | ICD-10-CM

## 2020-10-18 DIAGNOSIS — M25561 Pain in right knee: Secondary | ICD-10-CM | POA: Diagnosis not present

## 2020-10-23 DIAGNOSIS — J019 Acute sinusitis, unspecified: Secondary | ICD-10-CM | POA: Diagnosis not present

## 2020-10-24 DIAGNOSIS — R21 Rash and other nonspecific skin eruption: Secondary | ICD-10-CM | POA: Diagnosis not present

## 2020-11-07 ENCOUNTER — Encounter: Payer: Self-pay | Admitting: Adult Health

## 2020-11-07 ENCOUNTER — Telehealth (INDEPENDENT_AMBULATORY_CARE_PROVIDER_SITE_OTHER): Payer: BC Managed Care – PPO | Admitting: Adult Health

## 2020-11-07 DIAGNOSIS — F909 Attention-deficit hyperactivity disorder, unspecified type: Secondary | ICD-10-CM

## 2020-11-07 DIAGNOSIS — G4700 Insomnia, unspecified: Secondary | ICD-10-CM | POA: Diagnosis not present

## 2020-11-07 DIAGNOSIS — F428 Other obsessive-compulsive disorder: Secondary | ICD-10-CM

## 2020-11-07 DIAGNOSIS — F331 Major depressive disorder, recurrent, moderate: Secondary | ICD-10-CM

## 2020-11-07 DIAGNOSIS — F411 Generalized anxiety disorder: Secondary | ICD-10-CM | POA: Diagnosis not present

## 2020-11-07 MED ORDER — FLUOXETINE HCL 10 MG PO CAPS: 10.0000 mg | ORAL_CAPSULE | Freq: Every day | ORAL | 3 refills | Status: DC

## 2020-11-07 MED ORDER — AMPHETAMINE-DEXTROAMPHETAMINE 20 MG PO TABS: 20.0000 mg | ORAL_TABLET | Freq: Three times a day (TID) | ORAL | 0 refills | Status: DC

## 2020-11-07 MED ORDER — LORAZEPAM 1 MG PO TABS: ORAL_TABLET | ORAL | 2 refills | Status: DC

## 2020-11-07 MED ORDER — TRAZODONE HCL 100 MG PO TABS: ORAL_TABLET | ORAL | 3 refills | Status: DC

## 2020-11-07 NOTE — Progress Notes (Signed)
Christina HurterChristine W [name] [id number] 07/15/1985 35 y.o.  Virtual Visit via Video Note  I connected with pt @ on 11/07/20 at  1:00 PM EDT by a video enabled telemedicine application and verified that I am speaking with the correct person using two identifiers.   I discussed the limitations of evaluation and management by telemedicine and the availability of in person appointments. The patient expressed understanding and agreed to proceed.  I discussed the assessment and treatment plan with the patient. The patient was provided an opportunity to ask questions and all were answered. The patient agreed with the plan and demonstrated an understanding of the instructions.   The patient was advised to call back or seek an in-person evaluation if the symptoms worsen or if the condition fails to improve as anticipated.  I provided 30 minutes of non-face-to-face time during this encounter.  The patient was located at home.  [facility]   Dorothyann Gibbsegina N Demonte Dobratz, NP   Subjective:   Patient ID:  Christina [name] is a 35 y.o. (DOB 03/17/1986) female.  Chief Complaint: No chief complaint on file.   HPI Christina HurterChristine W [name] presents for follow-up of GAD, MDD, insomnia and ADHD.  Describes mood today as "ok". Pleasant. Denies tearfulness. Mood symptoms - decreased anxiety, depression, and irritability. Decreased worry and rumination. Stating "I'm doing better". Feels like medications continue to work well for her. Also stating "they check off everything on the list". Recent vacation for 2 weeks. Children will be starting public school in the fall. Seeing therapist. Stable interest and motivation. Taking medications as prescribed.  Energy levels stable - getting tired at times. Active, has a regular exercise routine.  Enjoys some usual interests and activities. Single. Lives alone with lives with 3 children. Appetite adequate. Weight stable. Sleeping well most nights. Averages 6 to 7  hours with Trazadone. Focus and concentration difficulties at times. Completing tasks. Managing aspects of household.  Denies SI or HI.  Denies AH or VH    Review of Systems:  Review of Systems  Musculoskeletal:  Negative for gait problem.  Neurological:  Negative for tremors.  Psychiatric/Behavioral:         Please refer to HPI   Medications: I have reviewed the patient's current medications.  Current Outpatient Medications  Medication Sig Dispense Refill   amphetamine-dextroamphetamine (ADDERALL) 20 MG tablet Take one tablet three times daily. 90 tablet 0   amphetamine-dextroamphetamine (ADDERALL) 20 MG tablet Take 1 tablet (20 mg total) by mouth 3 (three) times daily. 90 tablet 0   [START ON 12/05/2020] amphetamine-dextroamphetamine (ADDERALL) 20 MG tablet Take 1 tablet (20 mg total) by mouth 3 (three) times daily. 90 tablet 0   [START ON 01/02/2021] amphetamine-dextroamphetamine (ADDERALL) 20 MG tablet Take 1 tablet (20 mg total) by mouth 3 (three) times daily. 90 tablet 0   FLUoxetine (PROZAC) 10 MG capsule Take 1 capsule (10 mg total) by mouth daily. 270 capsule 3   loratadine (CLARITIN) 10 MG tablet Take by mouth.     LORazepam (ATIVAN) 1 MG tablet TAKE ONE TABLET 4 TIMES A DAY AS NEEDED FOR ANXIETY. 120 tablet 2   metoprolol tartrate (LOPRESSOR) 25 MG tablet TAKE 1 TABLET BY MOUTH TWICE A DAY. MAY TAKE AN EXTRA ONE TABLET AS NEEDED FOR INCREASED HEART RATE. Please make yearly appt with Dr. Ladona Ridgelaylor for March 2022. 1st attempt 225 tablet 0   Multiple Vitamin (MULTIVITAMIN WITH MINERALS) TABS tablet Take 1 tablet by mouth daily.  traZODone (DESYREL) 100 MG tablet TAKE 1 TO 2 TABLETS BY MOUTH EVERY DAY AT BEDTIME FOR SLEEP 180 tablet 3   No current facility-administered medications for this visit.    Medication Side Effects: None  Allergies:  Allergies  Allergen Reactions   Sulfa Antibiotics Hives, Itching and Swelling   Latex Itching   Polymyxin B-Trimethoprim     Other  reaction(s): eye redness/pain    Past Medical History:  Diagnosis Date   Anemia    borderline anemic   Anxiety    Depression    Eczema    GERD (gastroesophageal reflux disease)    with pregnancy   History of hip fracture 10/2010   still has pain, right hip   Hyperemesis arising during pregnancy    Increased heart rate    resting heart rate 100-120   Low energy    on bedrest during pregnancy trying to regain strength   SVD (spontaneous vaginal delivery) 12/16/2015   SVT (supraventricular tachycardia) (HCC)    UTI (lower urinary tract infection)    Vaginitis and vulvovaginitis     Family History  Problem Relation Age of Onset   Alcohol abuse Mother    Anxiety disorder Mother    Asthma Mother    Mental illness Mother        bipolar   Depression Mother    Hypothyroidism Mother    Ovarian cysts Mother    Hypertension Father    Endometriosis Sister    Ovarian cysts Sister    Alcohol abuse Brother    Crohn's disease Brother    Alcohol abuse Maternal Aunt    Cancer Maternal Aunt        breast   Hypothyroidism Maternal Aunt    Miscarriages / Stillbirths Maternal Aunt        stillbirth   Alcohol abuse Maternal Uncle    Asthma Maternal Grandmother    Mental illness Maternal Grandmother        bipolar   Asthma Maternal Grandfather    Diabetes Maternal Grandfather    Hypertension Maternal Grandfather    Asthma Paternal Grandmother    Hypertension Paternal Grandmother    Asthma Paternal Grandfather    Diabetes Paternal Grandfather    Heart disease Paternal Grandfather    Hypertension Paternal Grandfather     Social History   Socioeconomic History   Marital status: Married    Spouse name: Not on file   Number of children: Not on file   Years of education: Not on file   Highest education level: Not on file  Occupational History   Not on file  Tobacco Use   Smoking status: Never   Smokeless tobacco: Never  Vaping Use   Vaping Use: Never used  Substance and  Sexual Activity   Alcohol use: Yes    Comment: occ   Drug use: No   Sexual activity: Yes    Birth control/protection: None  Other Topics Concern   Not on file  Social History Narrative   Not on file   Social Determinants of Health   Financial Resource Strain: Not on file  Food Insecurity: Not on file  Transportation Needs: Not on file  Physical Activity: Not on file  Stress: Not on file  Social Connections: Not on file  Intimate Partner Violence: Not on file    Past Medical History, Surgical history, Social history, and Family history were reviewed and updated as appropriate.   Please see review of systems for further details on the  patient's review from today.   Objective:   Physical Exam:  There were no vitals taken for this visit.  Physical Exam Constitutional:      General: She is not in acute distress. Musculoskeletal:        General: No deformity.  Neurological:     Mental Status: She is alert and oriented to person, place, and time.     Coordination: Coordination normal.  Psychiatric:        Attention and Perception: Attention and perception normal. She does not perceive auditory or visual hallucinations.        Mood and Affect: Mood normal. Mood is not anxious or depressed. Affect is not labile, blunt, angry or inappropriate.        Speech: Speech normal.        Behavior: Behavior normal.        Thought Content: Thought content normal. Thought content is not paranoid or delusional. Thought content does not include homicidal or suicidal ideation. Thought content does not include homicidal or suicidal plan.        Cognition and Memory: Cognition and memory normal.        Judgment: Judgment normal.     Comments: Insight intact    Lab Review:     Component Value Date/Time   NA 137 04/07/2019 1834   K 3.8 04/07/2019 1834   CL 104 04/07/2019 1834   CO2 22 04/07/2019 1834   GLUCOSE 94 04/07/2019 1834   BUN 19 04/07/2019 1834   CREATININE 0.63 04/07/2019  1834   CALCIUM 9.1 04/07/2019 1834   PROT 7.1 10/04/2018 1940   ALBUMIN 4.1 10/04/2018 1940   AST 26 10/04/2018 1940   ALT 37 10/04/2018 1940   ALKPHOS 52 10/04/2018 1940   BILITOT 0.3 10/04/2018 1940   GFRNONAA >60 04/07/2019 1834   GFRAA >60 04/07/2019 1834       Component Value Date/Time   WBC 13.1 (H) 04/07/2019 1834   RBC 4.27 04/07/2019 1834   HGB 12.9 04/07/2019 1834   HCT 40.2 04/07/2019 1834   PLT 317 04/07/2019 1834   MCV 94.1 04/07/2019 1834   MCH 30.2 04/07/2019 1834   MCHC 32.1 04/07/2019 1834   RDW 12.1 04/07/2019 1834   LYMPHSABS 3.0 10/04/2018 1940   MONOABS 1.2 (H) 10/04/2018 1940   EOSABS 0.2 10/04/2018 1940   BASOSABS 0.1 10/04/2018 1940    No results found for: POCLITH, LITHIUM   No results found for: PHENYTOIN, PHENOBARB, VALPROATE, CBMZ   .res Assessment: Plan:   Plan:   1. Adderall 20mg  TID - will try taking 10mg  in the afternoon. 2. Ativan 1mg  four times daily for anxiety and panic attacks. 3. Trazadone 150mg  at hs  4. Prozac 10mg  - 3 daily  Continue therapy   RTC 3 months  Discussed potential benefits, risk, and side effects of benzodiazepines to include potential risk of tolerance and dependence, as well as possible drowsiness.  Advised patient not to drive if experiencing drowsiness and to take lowest possible effective dose to minimize risk of dependence and tolerance.  Discussed potential benefits, risks, and side effects of stimulants with patient to include increased heart rate, palpitations, insomnia, increased anxiety, increased irritability, or decreased appetite.  Instructed patient to contact office if experiencing any significant tolerability issues.  Diagnoses and all orders for this visit:  Generalized anxiety disorder -     FLUoxetine (PROZAC) 10 MG capsule; Take 1 capsule (10 mg total) by mouth daily. -  LORazepam (ATIVAN) 1 MG tablet; TAKE ONE TABLET 4 TIMES A DAY AS NEEDED FOR ANXIETY.  Insomnia, unspecified  type -     traZODone (DESYREL) 100 MG tablet; TAKE 1 TO 2 TABLETS BY MOUTH EVERY DAY AT BEDTIME FOR SLEEP  Obsessional thoughts -     FLUoxetine (PROZAC) 10 MG capsule; Take 1 capsule (10 mg total) by mouth daily.  Attention deficit hyperactivity disorder (ADHD), unspecified ADHD type -     amphetamine-dextroamphetamine (ADDERALL) 20 MG tablet; Take 1 tablet (20 mg total) by mouth 3 (three) times daily. -     amphetamine-dextroamphetamine (ADDERALL) 20 MG tablet; Take 1 tablet (20 mg total) by mouth 3 (three) times daily. -     amphetamine-dextroamphetamine (ADDERALL) 20 MG tablet; Take 1 tablet (20 mg total) by mouth 3 (three) times daily.  Major depressive disorder, recurrent episode, moderate (HCC)    Please see After Visit Summary for patient specific instructions.  No future appointments.   No orders of the defined types were placed in this encounter.     -------------------------------

## 2020-11-09 ENCOUNTER — Other Ambulatory Visit: Payer: Self-pay | Admitting: Adult Health

## 2020-11-09 DIAGNOSIS — F428 Other obsessive-compulsive disorder: Secondary | ICD-10-CM

## 2020-11-09 DIAGNOSIS — F411 Generalized anxiety disorder: Secondary | ICD-10-CM

## 2020-11-12 DIAGNOSIS — M546 Pain in thoracic spine: Secondary | ICD-10-CM | POA: Diagnosis not present

## 2020-12-12 ENCOUNTER — Other Ambulatory Visit: Payer: Self-pay | Admitting: Adult Health

## 2020-12-12 DIAGNOSIS — F411 Generalized anxiety disorder: Secondary | ICD-10-CM

## 2020-12-12 DIAGNOSIS — F428 Other obsessive-compulsive disorder: Secondary | ICD-10-CM

## 2021-01-19 DIAGNOSIS — S8992XA Unspecified injury of left lower leg, initial encounter: Secondary | ICD-10-CM | POA: Diagnosis not present

## 2021-01-19 DIAGNOSIS — S8012XA Contusion of left lower leg, initial encounter: Secondary | ICD-10-CM | POA: Diagnosis not present

## 2021-01-19 DIAGNOSIS — M25562 Pain in left knee: Secondary | ICD-10-CM | POA: Diagnosis not present

## 2021-01-19 DIAGNOSIS — S8002XA Contusion of left knee, initial encounter: Secondary | ICD-10-CM | POA: Diagnosis not present

## 2021-01-19 DIAGNOSIS — Z6835 Body mass index (BMI) 35.0-35.9, adult: Secondary | ICD-10-CM | POA: Diagnosis not present

## 2021-01-28 ENCOUNTER — Other Ambulatory Visit: Payer: Self-pay | Admitting: Adult Health

## 2021-01-28 DIAGNOSIS — F411 Generalized anxiety disorder: Secondary | ICD-10-CM

## 2021-01-28 DIAGNOSIS — F428 Other obsessive-compulsive disorder: Secondary | ICD-10-CM

## 2021-01-29 ENCOUNTER — Ambulatory Visit (INDEPENDENT_AMBULATORY_CARE_PROVIDER_SITE_OTHER): Payer: BC Managed Care – PPO | Admitting: Adult Health

## 2021-01-29 ENCOUNTER — Other Ambulatory Visit: Payer: Self-pay

## 2021-01-29 ENCOUNTER — Encounter: Payer: Self-pay | Admitting: Adult Health

## 2021-01-29 DIAGNOSIS — G4700 Insomnia, unspecified: Secondary | ICD-10-CM | POA: Diagnosis not present

## 2021-01-29 DIAGNOSIS — F428 Other obsessive-compulsive disorder: Secondary | ICD-10-CM

## 2021-01-29 DIAGNOSIS — F411 Generalized anxiety disorder: Secondary | ICD-10-CM | POA: Diagnosis not present

## 2021-01-29 DIAGNOSIS — F909 Attention-deficit hyperactivity disorder, unspecified type: Secondary | ICD-10-CM | POA: Diagnosis not present

## 2021-01-29 MED ORDER — AMPHETAMINE-DEXTROAMPHETAMINE 20 MG PO TABS: 20.0000 mg | ORAL_TABLET | Freq: Three times a day (TID) | ORAL | 0 refills | Status: DC

## 2021-01-29 MED ORDER — LORAZEPAM 1 MG PO TABS
ORAL_TABLET | ORAL | 2 refills | Status: DC
Start: 2021-01-29 — End: 2021-07-03

## 2021-01-29 MED ORDER — FLUOXETINE HCL 40 MG PO CAPS: 40.0000 mg | ORAL_CAPSULE | Freq: Every day | ORAL | 1 refills | Status: DC

## 2021-01-29 NOTE — Progress Notes (Signed)
Christina [name] [id number] Mar 05, 1986 35 y.o.  Subjective:   Patient ID:  Christina [name] is a 35 y.o. (DOB July 04, 1985) female.  Chief Complaint: No chief complaint on file.   HPI Christina [name] presents to the office today for follow-up of GAD, MDD, insomnia and ADHD.  Describes mood today as "ok". Pleasant. Denies tearfulness. Mood symptoms - decreased anxiety, depression, and irritability. Decreased worry and rumination. Stating "I'm doing alright". Feels like current medications are helpful - would like to increase Prozac from 30mg  to 40mg  daily.  She and children are doing well. Recently moved to a new home in [place] - adjusting well. Children are now in public school and "loving it". Has started a new job. Seeing therapist. Stable interest and motivation. Taking medications as prescribed.  Energy levels stable - getting tired at times. Active, has a regular exercise routine.  Enjoys some usual interests and activities. Single. Lives alone with lives with 3 children. Appetite adequate. Weight stable. Sleeping well most nights. Averages 6 to 7 hours with Trazadone. Focus and concentration difficulties at times. Completing tasks. Managing aspects of household.  Denies SI or HI.  Denies AH or VH  Review of Systems:  Review of Systems  Musculoskeletal:  Negative for gait problem.  Neurological:  Negative for tremors.  Psychiatric/Behavioral:         Please refer to HPI   Medications: I have reviewed the patient's current medications.  Current Outpatient Medications  Medication Sig Dispense Refill   amphetamine-dextroamphetamine (ADDERALL) 20 MG tablet Take one tablet three times daily. 90 tablet 0   amphetamine-dextroamphetamine (ADDERALL) 20 MG tablet Take 1 tablet (20 mg total) by mouth 3 (three) times daily. 90 tablet 0   [START ON 02/26/2021] amphetamine-dextroamphetamine (ADDERALL) 20 MG tablet Take 1 tablet (20 mg total) by mouth 3 (three) times daily. 90 tablet 0    [START ON 03/26/2021] amphetamine-dextroamphetamine (ADDERALL) 20 MG tablet Take 1 tablet (20 mg total) by mouth 3 (three) times daily. 90 tablet 0   FLUoxetine (PROZAC) 40 MG capsule Take 1 capsule (40 mg total) by mouth daily. 90 capsule 1   loratadine (CLARITIN) 10 MG tablet Take by mouth.     LORazepam (ATIVAN) 1 MG tablet TAKE ONE TABLET 4 TIMES A DAY AS NEEDED FOR ANXIETY. 120 tablet 2   metoprolol tartrate (LOPRESSOR) 25 MG tablet TAKE 1 TABLET BY MOUTH TWICE A DAY. MAY TAKE AN EXTRA ONE TABLET AS NEEDED FOR INCREASED HEART RATE. Please make yearly appt with Dr. 02/28/2021 for March 2022. 1st attempt 225 tablet 0   Multiple Vitamin (MULTIVITAMIN WITH MINERALS) TABS tablet Take 1 tablet by mouth daily.     traZODone (DESYREL) 100 MG tablet TAKE 1 TO 2 TABLETS BY MOUTH EVERY DAY AT BEDTIME FOR SLEEP 180 tablet 3   No current facility-administered medications for this visit.    Medication Side Effects: None  Allergies:  Allergies  Allergen Reactions   Sulfa Antibiotics Hives, Itching and Swelling   Latex Itching   Polymyxin B-Trimethoprim     Other reaction(s): eye redness/pain    Past Medical History:  Diagnosis Date   Anemia    borderline anemic   Anxiety    Depression    Eczema    GERD (gastroesophageal reflux disease)    with pregnancy   History of hip fracture 10/2010   still has pain, right hip   Hyperemesis arising during pregnancy    Increased heart rate    resting heart rate 100-120  Low energy    on bedrest during pregnancy trying to regain strength   SVD (spontaneous vaginal delivery) 12/16/2015   SVT (supraventricular tachycardia) (HCC)    UTI (lower urinary tract infection)    Vaginitis and vulvovaginitis     Past Medical History, Surgical history, Social history, and Family history were reviewed and updated as appropriate.   Please see review of systems for further details on the patient's review from today.   Objective:   Physical Exam:  There were no  vitals taken for this visit.  Physical Exam Constitutional:      General: She is not in acute distress. Musculoskeletal:        General: No deformity.  Neurological:     Mental Status: She is alert and oriented to person, place, and time.     Coordination: Coordination normal.  Psychiatric:        Attention and Perception: Attention and perception normal. She does not perceive auditory or visual hallucinations.        Mood and Affect: Mood normal. Mood is not anxious or depressed. Affect is not labile, blunt, angry or inappropriate.        Speech: Speech normal.        Behavior: Behavior normal.        Thought Content: Thought content normal. Thought content is not paranoid or delusional. Thought content does not include homicidal or suicidal ideation. Thought content does not include homicidal or suicidal plan.        Cognition and Memory: Cognition and memory normal.        Judgment: Judgment normal.     Comments: Insight intact    Lab Review:     Component Value Date/Time   NA 137 04/07/2019 1834   K 3.8 04/07/2019 1834   CL 104 04/07/2019 1834   CO2 22 04/07/2019 1834   GLUCOSE 94 04/07/2019 1834   BUN 19 04/07/2019 1834   CREATININE 0.63 04/07/2019 1834   CALCIUM 9.1 04/07/2019 1834   PROT 7.1 10/04/2018 1940   ALBUMIN 4.1 10/04/2018 1940   AST 26 10/04/2018 1940   ALT 37 10/04/2018 1940   ALKPHOS 52 10/04/2018 1940   BILITOT 0.3 10/04/2018 1940   GFRNONAA >60 04/07/2019 1834   GFRAA >60 04/07/2019 1834       Component Value Date/Time   WBC 13.1 (H) 04/07/2019 1834   RBC 4.27 04/07/2019 1834   HGB 12.9 04/07/2019 1834   HCT 40.2 04/07/2019 1834   PLT 317 04/07/2019 1834   MCV 94.1 04/07/2019 1834   MCH 30.2 04/07/2019 1834   MCHC 32.1 04/07/2019 1834   RDW 12.1 04/07/2019 1834   LYMPHSABS 3.0 10/04/2018 1940   MONOABS 1.2 (H) 10/04/2018 1940   EOSABS 0.2 10/04/2018 1940   BASOSABS 0.1 10/04/2018 1940    No results found for: POCLITH, LITHIUM   No  results found for: PHENYTOIN, PHENOBARB, VALPROATE, CBMZ   .res Assessment: Plan:    Plan:  1. Adderall 20mg  TID  2. Ativan 1mg  four times daily for anxiety and panic attacks. 3. Trazadone 150mg  at hs  4. Increase Prozac 30mg  to 40mg  daily  105/71/91  Continue therapy   RTC 3 months  Discussed potential benefits, risk, and side effects of benzodiazepines to include potential risk of tolerance and dependence, as well as possible drowsiness.  Advised patient not to drive if experiencing drowsiness and to take lowest possible effective dose to minimize risk of dependence and tolerance.  Discussed potential benefits, risks,  and side effects of stimulants with patient to include increased heart rate, palpitations, insomnia, increased anxiety, increased irritability, or decreased appetite.  Instructed patient to contact office if experiencing any significant tolerability issues.   Diagnoses and all orders for this visit:  Insomnia, unspecified type  Attention deficit hyperactivity disorder (ADHD), unspecified ADHD type -     amphetamine-dextroamphetamine (ADDERALL) 20 MG tablet; Take 1 tablet (20 mg total) by mouth 3 (three) times daily. -     amphetamine-dextroamphetamine (ADDERALL) 20 MG tablet; Take 1 tablet (20 mg total) by mouth 3 (three) times daily. -     amphetamine-dextroamphetamine (ADDERALL) 20 MG tablet; Take 1 tablet (20 mg total) by mouth 3 (three) times daily.  Generalized anxiety disorder -     FLUoxetine (PROZAC) 40 MG capsule; Take 1 capsule (40 mg total) by mouth daily. -     LORazepam (ATIVAN) 1 MG tablet; TAKE ONE TABLET 4 TIMES A DAY AS NEEDED FOR ANXIETY.  Obsessional thoughts -     FLUoxetine (PROZAC) 40 MG capsule; Take 1 capsule (40 mg total) by mouth daily.    Please see After Visit Summary for patient specific instructions.  No future appointments.  No orders of the defined types were placed in this encounter.   -------------------------------

## 2021-02-11 IMAGING — CR DG CHEST 2V
2 series · 2 of 2 positions shown · non-contrast
Comparison: 10/04/2018.

CLINICAL DATA: Right chest pain.  No known injury.

EXAM:
CHEST - 2 VIEW

[chest pa]
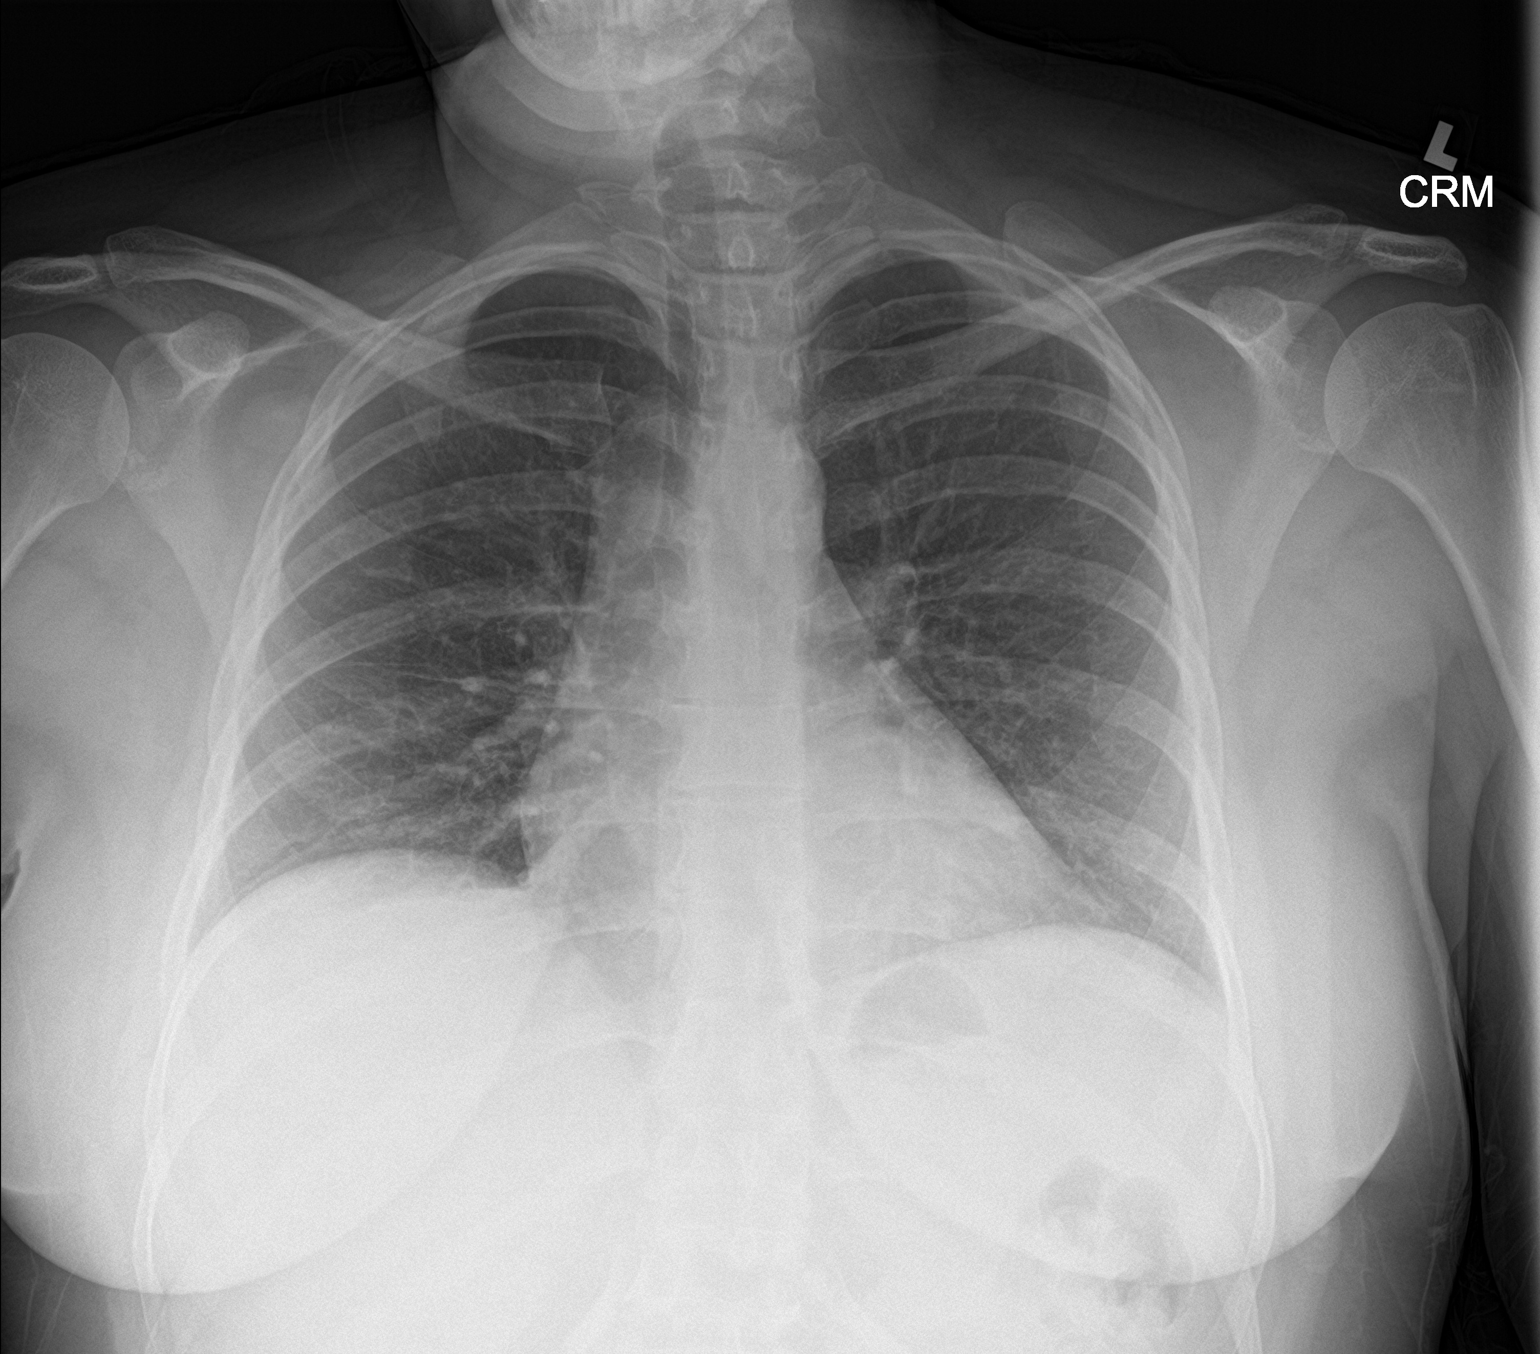

[chest lat]
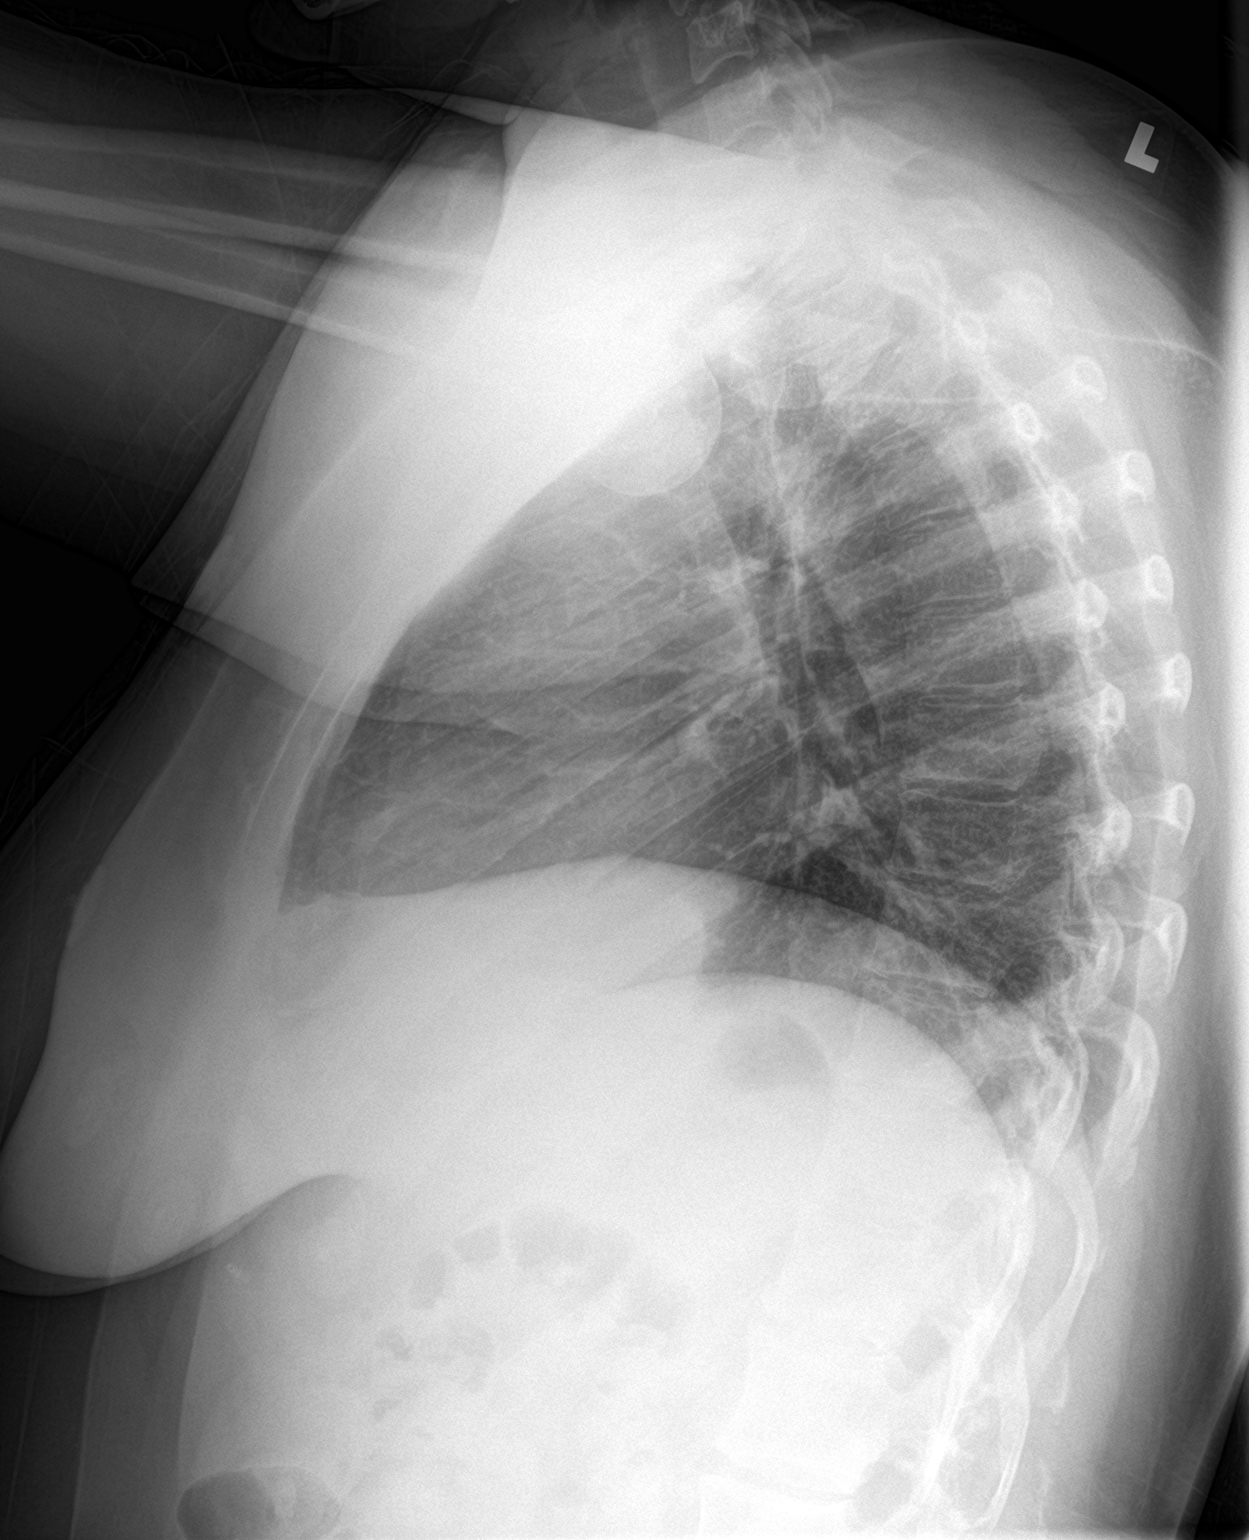

[2 of 2 positions shown; findings below may reference images not displayed]

FINDINGS: The heart size and mediastinal contours are within normal limits.
Both lungs are clear. The visualized skeletal structures are
unremarkable.
IMPRESSION: No active cardiopulmonary disease.

## 2021-02-25 DIAGNOSIS — Z79899 Other long term (current) drug therapy: Secondary | ICD-10-CM | POA: Diagnosis not present

## 2021-02-25 DIAGNOSIS — R197 Diarrhea, unspecified: Secondary | ICD-10-CM | POA: Diagnosis not present

## 2021-02-25 DIAGNOSIS — R112 Nausea with vomiting, unspecified: Secondary | ICD-10-CM | POA: Diagnosis not present

## 2021-02-25 DIAGNOSIS — Z20822 Contact with and (suspected) exposure to covid-19: Secondary | ICD-10-CM | POA: Diagnosis not present

## 2021-02-25 DIAGNOSIS — Z882 Allergy status to sulfonamides status: Secondary | ICD-10-CM | POA: Diagnosis not present

## 2021-02-25 DIAGNOSIS — Z883 Allergy status to other anti-infective agents status: Secondary | ICD-10-CM | POA: Diagnosis not present

## 2021-02-25 DIAGNOSIS — Z91040 Latex allergy status: Secondary | ICD-10-CM | POA: Diagnosis not present

## 2021-02-25 DIAGNOSIS — R1084 Generalized abdominal pain: Secondary | ICD-10-CM | POA: Diagnosis not present

## 2021-03-06 DIAGNOSIS — L820 Inflamed seborrheic keratosis: Secondary | ICD-10-CM | POA: Diagnosis not present

## 2021-03-06 DIAGNOSIS — L821 Other seborrheic keratosis: Secondary | ICD-10-CM | POA: Diagnosis not present

## 2021-05-30 DIAGNOSIS — D23112 Other benign neoplasm of skin of right lower eyelid, including canthus: Secondary | ICD-10-CM | POA: Diagnosis not present

## 2021-05-30 DIAGNOSIS — H5711 Ocular pain, right eye: Secondary | ICD-10-CM | POA: Diagnosis not present

## 2021-07-03 ENCOUNTER — Other Ambulatory Visit: Payer: Self-pay | Admitting: Adult Health

## 2021-07-03 DIAGNOSIS — F411 Generalized anxiety disorder: Secondary | ICD-10-CM

## 2021-07-03 NOTE — Telephone Encounter (Signed)
Last filled 04/13/21 appt tomorrow

## 2021-07-04 ENCOUNTER — Ambulatory Visit (INDEPENDENT_AMBULATORY_CARE_PROVIDER_SITE_OTHER): Payer: BC Managed Care – PPO | Admitting: Adult Health

## 2021-07-04 ENCOUNTER — Encounter: Payer: Self-pay | Admitting: Adult Health

## 2021-07-04 ENCOUNTER — Other Ambulatory Visit: Payer: Self-pay

## 2021-07-04 DIAGNOSIS — F411 Generalized anxiety disorder: Secondary | ICD-10-CM

## 2021-07-04 DIAGNOSIS — F909 Attention-deficit hyperactivity disorder, unspecified type: Secondary | ICD-10-CM

## 2021-07-04 DIAGNOSIS — F428 Other obsessive-compulsive disorder: Secondary | ICD-10-CM | POA: Diagnosis not present

## 2021-07-04 DIAGNOSIS — G4700 Insomnia, unspecified: Secondary | ICD-10-CM

## 2021-07-04 MED ORDER — TRAZODONE HCL 100 MG PO TABS: ORAL_TABLET | ORAL | 3 refills | Status: DC

## 2021-07-04 MED ORDER — AMPHETAMINE-DEXTROAMPHETAMINE 20 MG PO TABS: 20.0000 mg | ORAL_TABLET | Freq: Three times a day (TID) | ORAL | 0 refills | Status: DC

## 2021-07-04 MED ORDER — FLUOXETINE HCL 40 MG PO CAPS: 40.0000 mg | ORAL_CAPSULE | Freq: Every day | ORAL | 1 refills | Status: DC

## 2021-07-04 NOTE — Progress Notes (Signed)
Christina [name] ?[id number] ?June 05, 1985 ?36 y.o. ? ?Subjective:  ? ?Patient ID:  Christina [name] is a 36 y.o. (DOB 08-14-1985) female. ? ?Chief Complaint: No chief complaint on file. ? ? ?HPI ?Christina [name] presents to the office today for follow-up of GAD, MDD, insomnia and ADHD. ? ?Describes mood today as "ok". Pleasant. Denies tearfulness. Mood symptoms - more anxious overall. Decreased depression and irritability. Reports on going worry and rumination - over thinking - "that's my talent". Stating "I feel like I'm doing alright". Feels like current medications continue to work well. She and children doing well.Tried to reconcile with ex and it did not work out - has moved back to [place]. ?Children doing well. Has started a new job. Seeing therapist. Stable interest and motivation. Taking medications as prescribed.  ?Energy levels stable - more of a roller coaster lately. Active, has a regular exercise routine. Walking ?Enjoys some usual interests and activities. Single. Lives alone with lives with 3 children. ?Appetite adequate - fluctuates. Weight stable. ?Sleeping well most nights. Averages 8 hours with Trazadone. ?Focus and concentration difficulties at times. Completing tasks. Managing aspects of household. Has started a new job - "very stressful". ?Denies SI or HI.  ?Denies AH or VH ? ? ?Review of Systems:  ?Review of Systems  ?Musculoskeletal:  Negative for gait problem.  ?Neurological:  Negative for tremors.  ?Psychiatric/Behavioral:    ?     Please refer to HPI  ? ?Medications: I have reviewed the patient's current medications. ? ?Current Outpatient Medications  ?Medication Sig Dispense Refill  ? amphetamine-dextroamphetamine (ADDERALL) 20 MG tablet Take one tablet three times daily. 90 tablet 0  ? amphetamine-dextroamphetamine (ADDERALL) 20 MG tablet Take 1 tablet (20 mg total) by mouth 3 (three) times daily. 90 tablet 0  ? amphetamine-dextroamphetamine (ADDERALL) 20 MG tablet Take 1 tablet (20 mg  total) by mouth 3 (three) times daily. 90 tablet 0  ? amphetamine-dextroamphetamine (ADDERALL) 20 MG tablet Take 1 tablet (20 mg total) by mouth 3 (three) times daily. 90 tablet 0  ? FLUoxetine (PROZAC) 40 MG capsule Take 1 capsule (40 mg total) by mouth daily. 90 capsule 1  ? loratadine (CLARITIN) 10 MG tablet Take by mouth.    ? LORazepam (ATIVAN) 1 MG tablet TAKE ONE TABLET 4 TIMES A DAY AS NEEDED FOR ANXIETY. 120 tablet 2  ? metoprolol tartrate (LOPRESSOR) 25 MG tablet TAKE 1 TABLET BY MOUTH TWICE A DAY. MAY TAKE AN EXTRA ONE TABLET AS NEEDED FOR INCREASED HEART RATE. Please make yearly appt with Dr. Ladona Ridgel for March 2022. 1st attempt 225 tablet 0  ? Multiple Vitamin (MULTIVITAMIN WITH MINERALS) TABS tablet Take 1 tablet by mouth daily.    ? traZODone (DESYREL) 100 MG tablet TAKE 1 TO 2 TABLETS BY MOUTH EVERY DAY AT BEDTIME FOR SLEEP 180 tablet 3  ? ?No current facility-administered medications for this visit.  ? ? ?Medication Side Effects: None ? ?Allergies:  ?Allergies  ?Allergen Reactions  ? Sulfa Antibiotics Hives, Itching and Swelling  ? Latex Itching  ? Polymyxin B   ?  Other reaction(s): eye redness/pain  ? Polymyxin B-Trimethoprim   ?  Other reaction(s): eye redness/pain  ? ? ?Past Medical History:  ?Diagnosis Date  ? Anemia   ? borderline anemic  ? Anxiety   ? Depression   ? Eczema   ? GERD (gastroesophageal reflux disease)   ? with pregnancy  ? History of hip fracture 10/2010  ? still has pain, right hip  ?  Hyperemesis arising during pregnancy   ? Increased heart rate   ? resting heart rate 100-120  ? Low energy   ? on bedrest during pregnancy trying to regain strength  ? SVD (spontaneous vaginal delivery) 12/16/2015  ? SVT (supraventricular tachycardia) (HCC)   ? UTI (lower urinary tract infection)   ? Vaginitis and vulvovaginitis   ? ? ?Past Medical History, Surgical history, Social history, and Family history were reviewed and updated as appropriate.  ? ?Please see review of systems for further  details on the patient's review from today.  ? ?Objective:  ? ?Physical Exam:  ?There were no vitals taken for this visit. ? ?Physical Exam ?Constitutional:   ?   General: She is not in acute distress. ?Musculoskeletal:     ?   General: No deformity.  ?Neurological:  ?   Mental Status: She is alert and oriented to person, place, and time.  ?   Coordination: Coordination normal.  ?Psychiatric:     ?   Attention and Perception: Attention and perception normal. She does not perceive auditory or visual hallucinations.     ?   Mood and Affect: Mood normal. Mood is not anxious or depressed. Affect is not labile, blunt, angry or inappropriate.     ?   Speech: Speech normal.     ?   Behavior: Behavior normal.     ?   Thought Content: Thought content normal. Thought content is not paranoid or delusional. Thought content does not include homicidal or suicidal ideation. Thought content does not include homicidal or suicidal plan.     ?   Cognition and Memory: Cognition and memory normal.     ?   Judgment: Judgment normal.  ?   Comments: Insight intact  ? ? ?Lab Review:  ?   ?Component Value Date/Time  ? NA 137 04/07/2019 1834  ? K 3.8 04/07/2019 1834  ? CL 104 04/07/2019 1834  ? CO2 22 04/07/2019 1834  ? GLUCOSE 94 04/07/2019 1834  ? BUN 19 04/07/2019 1834  ? CREATININE 0.63 04/07/2019 1834  ? CALCIUM 9.1 04/07/2019 1834  ? PROT 7.1 10/04/2018 1940  ? ALBUMIN 4.1 10/04/2018 1940  ? AST 26 10/04/2018 1940  ? ALT 37 10/04/2018 1940  ? ALKPHOS 52 10/04/2018 1940  ? BILITOT 0.3 10/04/2018 1940  ? GFRNONAA >60 04/07/2019 1834  ? GFRAA >60 04/07/2019 1834  ? ? ?   ?Component Value Date/Time  ? WBC 13.1 (H) 04/07/2019 1834  ? RBC 4.27 04/07/2019 1834  ? HGB 12.9 04/07/2019 1834  ? HCT 40.2 04/07/2019 1834  ? PLT 317 04/07/2019 1834  ? MCV 94.1 04/07/2019 1834  ? MCH 30.2 04/07/2019 1834  ? MCHC 32.1 04/07/2019 1834  ? RDW 12.1 04/07/2019 1834  ? LYMPHSABS 3.0 10/04/2018 1940  ? MONOABS 1.2 (H) 10/04/2018 1940  ? EOSABS 0.2 10/04/2018  1940  ? BASOSABS 0.1 10/04/2018 1940  ? ? ?No results found for: POCLITH, LITHIUM  ? ?No results found for: PHENYTOIN, PHENOBARB, VALPROATE, CBMZ  ? ?.res ?Assessment: Plan:   ? ?Plan: ? ?1. Adderall 20mg  TID  ?2. Ativan 1mg  four times daily for anxiety and panic attacks. ?3. Trazadone 150mg  at hs  ?4. Prozac 40mg  daily ? ?105/71/91 ? ?Continue therapy  ? ?RTC 3 months ? ?Discussed potential benefits, risk, and side effects of benzodiazepines to include potential risk of tolerance and dependence, as well as possible drowsiness.  Advised patient not to drive if experiencing drowsiness and to take  lowest possible effective dose to minimize risk of dependence and tolerance. ? ?Discussed potential benefits, risks, and side effects of stimulants with patient to include increased heart rate, palpitations, insomnia, increased anxiety, increased irritability, or decreased appetite.  Instructed patient to contact office if experiencing any significant tolerability issues. ? ? ?Diagnoses and all orders for this visit: ? ?Generalized anxiety disorder ? ?Obsessional thoughts ? ?Insomnia, unspecified type ? ?Attention deficit hyperactivity disorder (ADHD), unspecified ADHD type ? ?  ? ?Please see After Visit Summary for patient specific instructions. ? ?No future appointments. ? ? ?No orders of the defined types were placed in this encounter. ? ? ?------------------------------- ?

## 2021-07-11 DIAGNOSIS — M2550 Pain in unspecified joint: Secondary | ICD-10-CM | POA: Diagnosis not present

## 2021-07-11 DIAGNOSIS — R5383 Other fatigue: Secondary | ICD-10-CM | POA: Diagnosis not present

## 2021-07-11 DIAGNOSIS — M25541 Pain in joints of right hand: Secondary | ICD-10-CM | POA: Diagnosis not present

## 2021-07-25 DIAGNOSIS — Z03818 Encounter for observation for suspected exposure to other biological agents ruled out: Secondary | ICD-10-CM | POA: Diagnosis not present

## 2021-07-25 DIAGNOSIS — R509 Fever, unspecified: Secondary | ICD-10-CM | POA: Diagnosis not present

## 2021-07-25 DIAGNOSIS — H9201 Otalgia, right ear: Secondary | ICD-10-CM | POA: Diagnosis not present

## 2021-07-25 DIAGNOSIS — R5383 Other fatigue: Secondary | ICD-10-CM | POA: Diagnosis not present

## 2021-07-25 DIAGNOSIS — J020 Streptococcal pharyngitis: Secondary | ICD-10-CM | POA: Diagnosis not present

## 2021-08-03 DIAGNOSIS — H9203 Otalgia, bilateral: Secondary | ICD-10-CM | POA: Diagnosis not present

## 2021-08-20 DIAGNOSIS — S7001XA Contusion of right hip, initial encounter: Secondary | ICD-10-CM | POA: Diagnosis not present

## 2021-08-20 DIAGNOSIS — M545 Low back pain, unspecified: Secondary | ICD-10-CM | POA: Diagnosis not present

## 2021-08-26 ENCOUNTER — Ambulatory Visit: Payer: BC Managed Care – PPO | Admitting: Internal Medicine

## 2021-08-27 ENCOUNTER — Other Ambulatory Visit: Payer: Self-pay | Admitting: Adult Health

## 2021-08-27 DIAGNOSIS — F411 Generalized anxiety disorder: Secondary | ICD-10-CM

## 2021-08-27 DIAGNOSIS — F428 Other obsessive-compulsive disorder: Secondary | ICD-10-CM

## 2021-09-09 ENCOUNTER — Ambulatory Visit: Payer: BC Managed Care – PPO | Admitting: Cardiology

## 2021-09-10 ENCOUNTER — Other Ambulatory Visit: Payer: Self-pay | Admitting: Adult Health

## 2021-09-10 DIAGNOSIS — F411 Generalized anxiety disorder: Secondary | ICD-10-CM

## 2021-09-10 DIAGNOSIS — F428 Other obsessive-compulsive disorder: Secondary | ICD-10-CM

## 2021-09-23 ENCOUNTER — Ambulatory Visit (INDEPENDENT_AMBULATORY_CARE_PROVIDER_SITE_OTHER): Payer: BC Managed Care – PPO

## 2021-09-23 ENCOUNTER — Ambulatory Visit: Payer: BC Managed Care – PPO | Admitting: Internal Medicine

## 2021-09-23 ENCOUNTER — Ambulatory Visit: Payer: BC Managed Care – PPO | Admitting: Student

## 2021-09-23 ENCOUNTER — Encounter: Payer: Self-pay | Admitting: Internal Medicine

## 2021-09-23 VITALS — BP 102/78 | HR 81 | Ht 67.0 in | Wt 228.0 lb

## 2021-09-23 DIAGNOSIS — G909 Disorder of the autonomic nervous system, unspecified: Secondary | ICD-10-CM

## 2021-09-23 DIAGNOSIS — R002 Palpitations: Secondary | ICD-10-CM

## 2021-09-23 NOTE — Progress Notes (Signed)
HPI [name] returns today after a two year absence from our EP clinic. She is a pleasant 36 yo woman with 5 children who has autonomic dysfunction by history. She may or may not have SVT. She has worn 2 monitors which demonstrated ST and occaisional PAC's/PVC's but no SVT. She has never had an ECG in SVT but does note that her heart will race with exertion but at times when she is resting. I last saw her 2 years ago. At that time she had palpitations and an increased HR on pulse ox but no other documentation. I encouraged an Apple watch or cardia mobile app. In the interim, she notes that she stopped using the trainer as her muscles hurt. She is still having occaisional episodes of heart racing with her BP cuff showing HR's up to 190/min. These episodes have increased to 3-4 times a month. Allergies  Allergen Reactions   Sulfa Antibiotics Hives, Itching and Swelling   Latex Itching   Polymyxin B     Other reaction(s): eye redness/pain   Polymyxin B-Trimethoprim     Other reaction(s): eye redness/pain     Current Outpatient Medications  Medication Sig Dispense Refill   FLUoxetine (PROZAC) 40 MG capsule TAKE 1 CAPSULE (40 MG TOTAL) BY MOUTH DAILY. 90 capsule 3   LORazepam (ATIVAN) 1 MG tablet TAKE ONE TABLET 4 TIMES A DAY AS NEEDED FOR ANXIETY. 120 tablet 2   metoprolol tartrate (LOPRESSOR) 25 MG tablet TAKE 1 TABLET BY MOUTH TWICE A DAY. MAY TAKE AN EXTRA ONE TABLET AS NEEDED FOR INCREASED HEART RATE. Please make yearly appt with Dr. Ladona Ridgelaylor for March 2022. 1st attempt 225 tablet 0   Multiple Vitamin (MULTIVITAMIN WITH MINERALS) TABS tablet Take 1 tablet by mouth daily.     traZODone (DESYREL) 100 MG tablet TAKE 1 TO 2 TABLETS BY MOUTH EVERY DAY AT BEDTIME FOR SLEEP 180 tablet 3   amphetamine-dextroamphetamine (ADDERALL) 20 MG tablet Take one tablet three times daily. (Patient not taking: Reported on 09/23/2021) 90 tablet 0   amphetamine-dextroamphetamine (ADDERALL) 20 MG tablet Take 1  tablet (20 mg total) by mouth 3 (three) times daily. (Patient not taking: Reported on 09/23/2021) 90 tablet 0   amphetamine-dextroamphetamine (ADDERALL) 20 MG tablet Take 1 tablet (20 mg total) by mouth 3 (three) times daily. (Patient not taking: Reported on 09/23/2021) 90 tablet 0   amphetamine-dextroamphetamine (ADDERALL) 20 MG tablet Take 1 tablet (20 mg total) by mouth 3 (three) times daily. (Patient not taking: Reported on 09/23/2021) 90 tablet 0   loratadine (CLARITIN) 10 MG tablet Take by mouth. (Patient not taking: Reported on 09/23/2021)     No current facility-administered medications for this visit.     Past Medical History:  Diagnosis Date   Anemia    borderline anemic   Anxiety    Depression    Eczema    GERD (gastroesophageal reflux disease)    with pregnancy   History of hip fracture 10/2010   still has pain, right hip   Hyperemesis arising during pregnancy    Increased heart rate    resting heart rate 100-120   Low energy    on bedrest during pregnancy trying to regain strength   SVD (spontaneous vaginal delivery) 12/16/2015   SVT (supraventricular tachycardia) (HCC)    UTI (lower urinary tract infection)    Vaginitis and vulvovaginitis     ROS:   All systems reviewed and negative except as noted in the HPI.  Past Surgical History:  Procedure Laterality Date   LAPAROSCOPIC TUBAL LIGATION Bilateral 02/28/2016   Procedure: LAPAROSCOPIC TUBAL LIGATION;  Surgeon: Lavina Hamman, MD;  Location: [facility];  Service: Gynecology;  Laterality: Bilateral;   LAPAROSCOPY Left 06/20/2012   Procedure: LAPAROSCOPY OPERATIVE;  Surgeon: Lavina Hamman, MD;  Location: [facility];  Service: Gynecology;  Laterality: Left;  OPEN LAPAROSCOPY, removal of portion of left ovarian cyst wall   WISDOM TOOTH EXTRACTION       Family History  Problem Relation Age of Onset   Alcohol abuse Mother    Anxiety disorder Mother    Asthma Mother    Mental illness Mother        bipolar   Depression  Mother    Hypothyroidism Mother    Ovarian cysts Mother    Hypertension Father    Endometriosis Sister    Ovarian cysts Sister    Alcohol abuse Brother    Crohn's disease Brother    Alcohol abuse Maternal Aunt    Cancer Maternal Aunt        breast   Hypothyroidism Maternal Aunt    Miscarriages / Stillbirths Maternal Aunt        stillbirth   Alcohol abuse Maternal Uncle    Asthma Maternal Grandmother    Mental illness Maternal Grandmother        bipolar   Asthma Maternal Grandfather    Diabetes Maternal Grandfather    Hypertension Maternal Grandfather    Asthma Paternal Grandmother    Hypertension Paternal Grandmother    Asthma Paternal Grandfather    Diabetes Paternal Grandfather    Heart disease Paternal Grandfather    Hypertension Paternal Grandfather      Social History   Socioeconomic History   Marital status: Married    Spouse name: Not on file   Number of children: Not on file   Years of education: Not on file   Highest education level: Not on file  Occupational History   Not on file  Tobacco Use   Smoking status: Never   Smokeless tobacco: Never  Vaping Use   Vaping Use: Never used  Substance and Sexual Activity   Alcohol use: Yes    Comment: occ   Drug use: No   Sexual activity: Yes    Birth control/protection: None  Other Topics Concern   Not on file  Social History Narrative   Not on file   Social Determinants of Health   Financial Resource Strain: Not on file  Food Insecurity: Not on file  Transportation Needs: Not on file  Physical Activity: Not on file  Stress: Not on file  Social Connections: Not on file  Intimate Partner Violence: Not on file     BP 102/78   Pulse 81   Ht 5\' 7"  (1.702 m)   Wt 228 lb (103.4 kg)   SpO2 96%   BMI 35.71 kg/m   Physical Exam:  Well appearing obese woman, NAD HEENT: Unremarkable Neck:  No JVD, no thyromegally Lymphatics:  No adenopathy Back:  No CVA tenderness Lungs:  Clear with no  wheezes HEART:  Regular rate rhythm, no murmurs, no rubs, no clicks Abd:  soft, obese, positive bowel sounds, no organomegally, no rebound, no guarding Ext:  2 plus pulses, no edema, no cyanosis, no clubbing Skin:  No rashes no nodules Neuro:  CN II through XII intact, motor grossly intact  EKG - nsr  Assess/Plan:  1. Autonomic dysfunction - she has tried to improve her hydration  and her symptoms have improved. I encouraged her to increase her salt intake. Overall this is better. 2. Palpitations - her history may suggest SVT but could also be sinus tachycardia.We discussed the different options for monitoring and I recommended she wear a 14 day zio monitor.  3. Obesity - she had hired a Psychologist, educational when I saw her last. Her weight is about the same as 2 years ago.    Leonia Reeves.D.

## 2021-09-23 NOTE — Progress Notes (Unsigned)
[id number] zio xt from office inventory applied in office.

## 2021-09-23 NOTE — Patient Instructions (Addendum)
Medication Instructions:  Your physician recommends that you continue on your current medications as directed. Please refer to the Current Medication list given to you today.  Labwork: None ordered.  Testing/Procedures: None ordered.  Follow-Up: Your physician wants you to follow-up based on heart monitor results.  Any Other Special Instructions Will Be Listed Below (If Applicable).  If you need a refill on your cardiac medications before your next appointment, please call your pharmacy.   Important Information About Sugar      Your physician has recommended that you wear a Zio monitor.   This monitor is a medical device that records the heart's electrical activity. Doctors most often use these monitors to diagnose arrhythmias. Arrhythmias are problems with the speed or rhythm of the heartbeat. The monitor is a small device applied to your chest. You can wear one while you do your normal daily activities. While wearing this monitor if you have any symptoms to push the button and record what you felt. Once you have worn this monitor for the period of time provider prescribed (Usually 14 days), you will return the monitor device in the postage paid box. Once it is returned they will download the data collected and provide [place] with a report which the provider will then review and we will call you with those results. Important tips:  Avoid showering during the first 24 hours of wearing the monitor. Avoid excessive sweating to help maximize wear time. Do not submerge the device, no hot tubs, and no swimming pools. Keep any lotions or oils away from the patch. After 24 hours you may shower with the patch on. Take brief showers with your back facing the shower head.  Do not remove patch once it has been placed because that will interrupt data and decrease adhesive wear time. Push the button when you have any symptoms and write down what you were feeling. Once you have completed wearing your  monitor, remove and place into box which has postage paid and place in your outgoing mailbox.  If for some reason you have misplaced your box then call our office and we can provide another box and/or mail it off for you.

## 2021-11-14 ENCOUNTER — Encounter: Payer: Self-pay | Admitting: Adult Health

## 2021-11-14 ENCOUNTER — Telehealth (INDEPENDENT_AMBULATORY_CARE_PROVIDER_SITE_OTHER): Payer: BC Managed Care – PPO | Admitting: Adult Health

## 2021-11-14 DIAGNOSIS — G4700 Insomnia, unspecified: Secondary | ICD-10-CM

## 2021-11-14 DIAGNOSIS — F411 Generalized anxiety disorder: Secondary | ICD-10-CM | POA: Diagnosis not present

## 2021-11-14 DIAGNOSIS — F909 Attention-deficit hyperactivity disorder, unspecified type: Secondary | ICD-10-CM | POA: Diagnosis not present

## 2021-11-14 DIAGNOSIS — F331 Major depressive disorder, recurrent, moderate: Secondary | ICD-10-CM

## 2021-11-14 DIAGNOSIS — F428 Other obsessive-compulsive disorder: Secondary | ICD-10-CM

## 2021-11-14 MED ORDER — AMPHETAMINE-DEXTROAMPHETAMINE 20 MG PO TABS: 20.0000 mg | ORAL_TABLET | Freq: Two times a day (BID) | ORAL | 0 refills | Status: DC

## 2021-11-14 MED ORDER — LORAZEPAM 1 MG PO TABS: ORAL_TABLET | ORAL | 2 refills | Status: DC

## 2021-11-14 NOTE — Progress Notes (Signed)
Christina [name] OF:[id number] 03-11-1986 36 y.o.  Virtual Visit via Video Note  I connected with pt @ on 11/14/21 at  1:20 PM EDT by a video enabled telemedicine application and verified that I am speaking with the correct person using two identifiers.   I discussed the limitations of evaluation and management by telemedicine and the availability of in person appointments. The patient expressed understanding and agreed to proceed.  I discussed the assessment and treatment plan with the patient. The patient was provided an opportunity to ask questions and all were answered. The patient agreed with the plan and demonstrated an understanding of the instructions.   The patient was advised to call back or seek an in-person evaluation if the symptoms worsen or if the condition fails to improve as anticipated.  I provided 25 minutes of non-face-to-face time during this encounter.  The patient was located at home.  The provider was located at [place].   Christina Gell, NP   Subjective:   Patient ID:  Christina [name] is a 36 y.o. (DOB Jul 22, 1985) female.  Chief Complaint: No chief complaint on file.   HPI Christina [name] presents for follow-up of GAD, MDD, insomnia, obsessional thoughts and ADHD.  Describes mood today as "ok". Pleasant. Denies tearfulness. Mood symptoms - reports increased anxiety at times - more surrounding her children. Decreased depression. Feels irritability at times. Reports ongoing worry and rumination. Reports some situational stressors. Stating "I feel like I'm doing ok". She and children doing well - daughter in a cast - broken bone in foot. Vacation planned to [place]. Feels like current medications continue to work well. Seeing therapist. Stable interest and motivation. Taking medications as prescribed.  Energy levels stable. Active, has a regular exercise routine.   Enjoys some usual interests and activities. Single. Lives alone with lives with 3  children. Appetite adequate. Weight stable. Sleeping well most nights. Averages 8 hours with Trazadone. Focus and concentration difficulties at times. Completing tasks. Managing aspects of household. Working full-time. Denies SI or HI.  Denies AH or VH.  Review of Systems:  Review of Systems  Musculoskeletal:  Negative for gait problem.  Neurological:  Negative for tremors.  Psychiatric/Behavioral:         Please refer to HPI    Medications: I have reviewed the patient's current medications.  Current Outpatient Medications  Medication Sig Dispense Refill   amphetamine-dextroamphetamine (ADDERALL) 20 MG tablet Take one tablet three times daily. (Patient not taking: Reported on 09/23/2021) 90 tablet 0   amphetamine-dextroamphetamine (ADDERALL) 20 MG tablet Take 1 tablet (20 mg total) by mouth 2 (two) times daily. 60 tablet 0   [START ON 12/12/2021] amphetamine-dextroamphetamine (ADDERALL) 20 MG tablet Take 1 tablet (20 mg total) by mouth 2 (two) times daily. 60 tablet 0   [START ON 01/09/2022] amphetamine-dextroamphetamine (ADDERALL) 20 MG tablet Take 1 tablet (20 mg total) by mouth 2 (two) times daily. 60 tablet 0   FLUoxetine (PROZAC) 40 MG capsule TAKE 1 CAPSULE (40 MG TOTAL) BY MOUTH DAILY. 90 capsule 3   loratadine (CLARITIN) 10 MG tablet Take by mouth. (Patient not taking: Reported on 09/23/2021)     LORazepam (ATIVAN) 1 MG tablet TAKE ONE TABLET 4 TIMES A DAY AS NEEDED FOR ANXIETY. 120 tablet 2   metoprolol tartrate (LOPRESSOR) 25 MG tablet TAKE 1 TABLET BY MOUTH TWICE A DAY. MAY TAKE AN EXTRA ONE TABLET AS NEEDED FOR INCREASED HEART RATE. Please make yearly appt with Dr. Lovena Le for March 2022.  1st attempt 225 tablet 0   Multiple Vitamin (MULTIVITAMIN WITH MINERALS) TABS tablet Take 1 tablet by mouth daily.     traZODone (DESYREL) 100 MG tablet TAKE 1 TO 2 TABLETS BY MOUTH EVERY DAY AT BEDTIME FOR SLEEP 180 tablet 3   No current facility-administered medications for this visit.     Medication Side Effects: None  Allergies:  Allergies  Allergen Reactions   Sulfa Antibiotics Hives, Itching and Swelling   Latex Itching   Polymyxin B     Other reaction(s): eye redness/pain   Polymyxin B-Trimethoprim     Other reaction(s): eye redness/pain    Past Medical History:  Diagnosis Date   Anemia    borderline anemic   Anxiety    Depression    Eczema    GERD (gastroesophageal reflux disease)    with pregnancy   History of hip fracture 10/2010   still has pain, right hip   Hyperemesis arising during pregnancy    Increased heart rate    resting heart rate 100-120   Low energy    on bedrest during pregnancy trying to regain strength   SVD (spontaneous vaginal delivery) 12/16/2015   SVT (supraventricular tachycardia) (HCC)    UTI (lower urinary tract infection)    Vaginitis and vulvovaginitis     Family History  Problem Relation Age of Onset   Alcohol abuse Mother    Anxiety disorder Mother    Asthma Mother    Mental illness Mother        bipolar   Depression Mother    Hypothyroidism Mother    Ovarian cysts Mother    Hypertension Father    Endometriosis Sister    Ovarian cysts Sister    Alcohol abuse Brother    Crohn's disease Brother    Alcohol abuse Maternal Aunt    Cancer Maternal Aunt        breast   Hypothyroidism Maternal Aunt    Miscarriages / Stillbirths Maternal Aunt        stillbirth   Alcohol abuse Maternal Uncle    Asthma Maternal Grandmother    Mental illness Maternal Grandmother        bipolar   Asthma Maternal Grandfather    Diabetes Maternal Grandfather    Hypertension Maternal Grandfather    Asthma Paternal Grandmother    Hypertension Paternal Grandmother    Asthma Paternal Grandfather    Diabetes Paternal Grandfather    Heart disease Paternal Grandfather    Hypertension Paternal Grandfather     Social History   Socioeconomic History   Marital status: Married    Spouse name: Not on file   Number of children: Not on  file   Years of education: Not on file   Highest education level: Not on file  Occupational History   Not on file  Tobacco Use   Smoking status: Never   Smokeless tobacco: Never  Vaping Use   Vaping Use: Never used  Substance and Sexual Activity   Alcohol use: Yes    Comment: occ   Drug use: No   Sexual activity: Yes    Birth control/protection: None  Other Topics Concern   Not on file  Social History Narrative   Not on file   Social Determinants of Health   Financial Resource Strain: Not on file  Food Insecurity: Not on file  Transportation Needs: Not on file  Physical Activity: Not on file  Stress: Not on file  Social Connections: Not on file  Intimate  Partner Violence: Not on file    Past Medical History, Surgical history, Social history, and Family history were reviewed and updated as appropriate.   Please see review of systems for further details on the patient's review from today.   Objective:   Physical Exam:  There were no vitals taken for this visit.  Physical Exam Constitutional:      General: She is not in acute distress. Musculoskeletal:        General: No deformity.  Neurological:     Mental Status: She is alert and oriented to person, place, and time.     Coordination: Coordination normal.  Psychiatric:        Attention and Perception: Attention and perception normal. She does not perceive auditory or visual hallucinations.        Mood and Affect: Mood normal. Mood is not anxious or depressed. Affect is not labile, blunt, angry or inappropriate.        Speech: Speech normal.        Behavior: Behavior normal.        Thought Content: Thought content normal. Thought content is not paranoid or delusional. Thought content does not include homicidal or suicidal ideation. Thought content does not include homicidal or suicidal plan.        Cognition and Memory: Cognition and memory normal.        Judgment: Judgment normal.     Comments: Insight intact      Lab Review:     Component Value Date/Time   NA 137 04/07/2019 1834   K 3.8 04/07/2019 1834   CL 104 04/07/2019 1834   CO2 22 04/07/2019 1834   GLUCOSE 94 04/07/2019 1834   BUN 19 04/07/2019 1834   CREATININE 0.63 04/07/2019 1834   CALCIUM 9.1 04/07/2019 1834   PROT 7.1 10/04/2018 1940   ALBUMIN 4.1 10/04/2018 1940   AST 26 10/04/2018 1940   ALT 37 10/04/2018 1940   ALKPHOS 52 10/04/2018 1940   BILITOT 0.3 10/04/2018 1940   GFRNONAA >60 04/07/2019 1834   GFRAA >60 04/07/2019 1834       Component Value Date/Time   WBC 13.1 (H) 04/07/2019 1834   RBC 4.27 04/07/2019 1834   HGB 12.9 04/07/2019 1834   HCT 40.2 04/07/2019 1834   PLT 317 04/07/2019 1834   MCV 94.1 04/07/2019 1834   MCH 30.2 04/07/2019 1834   MCHC 32.1 04/07/2019 1834   RDW 12.1 04/07/2019 1834   LYMPHSABS 3.0 10/04/2018 1940   MONOABS 1.2 (H) 10/04/2018 1940   EOSABS 0.2 10/04/2018 1940   BASOSABS 0.1 10/04/2018 1940    No results found for: "POCLITH", "LITHIUM"   No results found for: "PHENYTOIN", "PHENOBARB", "VALPROATE", "CBMZ"   .res Assessment: Plan:    Plan:  1. Adderall 20mg  TID  2. Ativan 1mg  four times daily for anxiety and panic attacks. 3. Trazadone 150mg  at hs  4. Prozac 40mg  daily  Monitor BP between visits  Continue therapy   RTC 3 months  Discussed potential benefits, risk, and side effects of benzodiazepines to include potential risk of tolerance and dependence, as well as possible drowsiness.  Advised patient not to drive if experiencing drowsiness and to take lowest possible effective dose to minimize risk of dependence and tolerance.  Discussed potential benefits, risks, and side effects of stimulants with patient to include increased heart rate, palpitations, insomnia, increased anxiety, increased irritability, or decreased appetite.  Instructed patient to contact office if experiencing any significant tolerability issues. Diagnoses and all  orders for this  visit:  Insomnia, unspecified type  Generalized anxiety disorder -     LORazepam (ATIVAN) 1 MG tablet; TAKE ONE TABLET 4 TIMES A DAY AS NEEDED FOR ANXIETY.  Attention deficit hyperactivity disorder (ADHD), unspecified ADHD type -     amphetamine-dextroamphetamine (ADDERALL) 20 MG tablet; Take 1 tablet (20 mg total) by mouth 2 (two) times daily. -     amphetamine-dextroamphetamine (ADDERALL) 20 MG tablet; Take 1 tablet (20 mg total) by mouth 2 (two) times daily. -     amphetamine-dextroamphetamine (ADDERALL) 20 MG tablet; Take 1 tablet (20 mg total) by mouth 2 (two) times daily.  Major depressive disorder, recurrent episode, moderate ([facility])  Obsessional thoughts     Please see After Visit Summary for patient specific instructions.  No future appointments.  No orders of the defined types were placed in this encounter.     -------------------------------

## 2021-11-24 ENCOUNTER — Other Ambulatory Visit: Payer: Self-pay | Admitting: Internal Medicine

## 2021-11-25 MED ORDER — METOPROLOL TARTRATE 25 MG PO TABS: ORAL_TABLET | ORAL | 2 refills | Status: DC

## 2022-01-19 ENCOUNTER — Telehealth: Payer: Self-pay | Admitting: Adult Health

## 2022-01-19 NOTE — Telephone Encounter (Signed)
Next visit is 02/17/22. Her mom died on 2022-08-13 and she keeps having panic attacks. When asked about suicidal thoughts she states she is not having any of these. She has no family  and only had her mom. She is crying and very emotional on the phone. Her phone number is [phone].

## 2022-01-19 NOTE — Telephone Encounter (Signed)
Patient said her mother died Friday and she is struggling to manage. She does have lorazepam she is taking QID but it is not helping currently. I know it is grief reaction.

## 2022-01-30 DIAGNOSIS — R5383 Other fatigue: Secondary | ICD-10-CM | POA: Diagnosis not present

## 2022-01-30 DIAGNOSIS — E559 Vitamin D deficiency, unspecified: Secondary | ICD-10-CM | POA: Diagnosis not present

## 2022-01-30 DIAGNOSIS — R7982 Elevated C-reactive protein (CRP): Secondary | ICD-10-CM | POA: Diagnosis not present

## 2022-02-17 ENCOUNTER — Telehealth (INDEPENDENT_AMBULATORY_CARE_PROVIDER_SITE_OTHER): Payer: BC Managed Care – PPO | Admitting: Adult Health

## 2022-02-17 ENCOUNTER — Encounter: Payer: Self-pay | Admitting: Adult Health

## 2022-02-17 DIAGNOSIS — F909 Attention-deficit hyperactivity disorder, unspecified type: Secondary | ICD-10-CM

## 2022-02-17 DIAGNOSIS — G4700 Insomnia, unspecified: Secondary | ICD-10-CM

## 2022-02-17 DIAGNOSIS — F331 Major depressive disorder, recurrent, moderate: Secondary | ICD-10-CM

## 2022-02-17 DIAGNOSIS — F411 Generalized anxiety disorder: Secondary | ICD-10-CM

## 2022-02-17 DIAGNOSIS — F428 Other obsessive-compulsive disorder: Secondary | ICD-10-CM | POA: Diagnosis not present

## 2022-02-17 MED ORDER — LORAZEPAM 1 MG PO TABS: ORAL_TABLET | ORAL | 2 refills | Status: DC

## 2022-02-17 MED ORDER — BUSPIRONE HCL 5 MG PO TABS: 5.0000 mg | ORAL_TABLET | Freq: Three times a day (TID) | ORAL | 2 refills | Status: DC

## 2022-02-17 NOTE — Progress Notes (Signed)
[name] [id number] 1985/09/04 36 y.o.  Virtual Visit via Video Note  I connected with pt @ on 02/17/22 at 11:20 AM EDT by a video enabled telemedicine application and verified that I am speaking with the correct person using two identifiers.   I discussed the limitations of evaluation and management by telemedicine and the availability of in person appointments. The patient expressed understanding and agreed to proceed.  I discussed the assessment and treatment plan with the patient. The patient was provided an opportunity to ask questions and all were answered. The patient agreed with the plan and demonstrated an understanding of the instructions.   The patient was advised to call back or seek an in-person evaluation if the symptoms worsen or if the condition fails to improve as anticipated.  I provided 25 minutes of non-face-to-face time during this encounter.  The patient was located at home.  The provider was located at [facility].   Christina Gibbs, NP   Subjective:   Patient ID:  Christina [name] is a 36 y.o. (DOB 1985/07/03) female.  Chief Complaint: No chief complaint on file.   HPI MADDOX BRATCHER presents for follow-up of GAD, MDD, insomnia, obsessional thoughts and ADHD.  Describes mood today as "not good". Pleasant. Denies tearfulness. Mood symptoms - reports a "high level" of situational depression. Increased anxiety. Reports panic attacks. Reports irritability at times. Reports ongoing worry and rumination. Stating "I'm not doing too good". Reports increased situational stressors. Sister diagnosed with thyroid cancer, biological mother passed away, recently fired from job and divorce settlement. Stating "it's been a whirl wind of options". Children doing well. Feels like current medications are helpful, but is willing to consider other options. Plans to start seeing a therapist. Stable interest and motivation. Taking medications as prescribed.  Energy  levels lower - feels tired all the time. Active, does not have a regular exercise routine.   Enjoys some usual interests and activities. Single. Lives alone with lives with 3 children. Appetite fluctuates. Weight fluctuates by 10 pounds. Sleeping well most nights. Averages 5 or more hours with Trazadone. Reports some daytime napping - once or twice a week. Focus and concentration variable. Completing tasks. Managing aspects of household. Unemployed - applying for jobs. Denies SI or HI.  Denies AH or VH. Denies self harm. Denies substance use.   Review of Systems:  Review of Systems  Musculoskeletal:  Negative for gait problem.  Neurological:  Negative for tremors.  Psychiatric/Behavioral:         Please refer to HPI    Medications: I have reviewed the patient's current medications.  Current Outpatient Medications  Medication Sig Dispense Refill   busPIRone (BUSPAR) 5 MG tablet Take 1 tablet (5 mg total) by mouth 3 (three) times daily. 90 tablet 2   amphetamine-dextroamphetamine (ADDERALL) 20 MG tablet Take one tablet three times daily. (Patient not taking: Reported on 09/23/2021) 90 tablet 0   amphetamine-dextroamphetamine (ADDERALL) 20 MG tablet Take 1 tablet (20 mg total) by mouth 2 (two) times daily. 60 tablet 0   amphetamine-dextroamphetamine (ADDERALL) 20 MG tablet Take 1 tablet (20 mg total) by mouth 2 (two) times daily. 60 tablet 0   amphetamine-dextroamphetamine (ADDERALL) 20 MG tablet Take 1 tablet (20 mg total) by mouth 2 (two) times daily. 60 tablet 0   FLUoxetine (PROZAC) 40 MG capsule TAKE 1 CAPSULE (40 MG TOTAL) BY MOUTH DAILY. 90 capsule 3   loratadine (CLARITIN) 10 MG tablet Take by mouth. (Patient not taking: Reported  on 09/23/2021)     LORazepam (ATIVAN) 1 MG tablet TAKE ONE TABLET 4 TIMES A DAY AS NEEDED FOR ANXIETY. 120 tablet 2   metoprolol tartrate (LOPRESSOR) 25 MG tablet TAKE 1 TABLET BY MOUTH TWICE A DAY. MAY TAKE AN EXTRA ONE TABLET AS NEEDED FOR INCREASED HEART  RATE. 225 tablet 2   Multiple Vitamin (MULTIVITAMIN WITH MINERALS) TABS tablet Take 1 tablet by mouth daily.     traZODone (DESYREL) 100 MG tablet TAKE 1 TO 2 TABLETS BY MOUTH EVERY DAY AT BEDTIME FOR SLEEP 180 tablet 3   No current facility-administered medications for this visit.    Medication Side Effects: None  Allergies:  Allergies  Allergen Reactions   Sulfa Antibiotics Hives, Itching and Swelling   Latex Itching   Polymyxin B     Other reaction(s): eye redness/pain   Polymyxin B-Trimethoprim     Other reaction(s): eye redness/pain    Past Medical History:  Diagnosis Date   Anemia    borderline anemic   Anxiety    Depression    Eczema    GERD (gastroesophageal reflux disease)    with pregnancy   History of hip fracture 10/2010   still has pain, right hip   Hyperemesis arising during pregnancy    Increased heart rate    resting heart rate 100-120   Low energy    on bedrest during pregnancy trying to regain strength   SVD (spontaneous vaginal delivery) 12/16/2015   SVT (supraventricular tachycardia)    UTI (lower urinary tract infection)    Vaginitis and vulvovaginitis     Family History  Problem Relation Age of Onset   Alcohol abuse Mother    Anxiety disorder Mother    Asthma Mother    Mental illness Mother        bipolar   Depression Mother    Hypothyroidism Mother    Ovarian cysts Mother    Hypertension Father    Endometriosis Sister    Ovarian cysts Sister    Alcohol abuse Brother    Crohn's disease Brother    Alcohol abuse Maternal Aunt    Cancer Maternal Aunt        breast   Hypothyroidism Maternal Aunt    Miscarriages / Stillbirths Maternal Aunt        stillbirth   Alcohol abuse Maternal Uncle    Asthma Maternal Grandmother    Mental illness Maternal Grandmother        bipolar   Asthma Maternal Grandfather    Diabetes Maternal Grandfather    Hypertension Maternal Grandfather    Asthma Paternal Grandmother    Hypertension Paternal  Grandmother    Asthma Paternal Grandfather    Diabetes Paternal Grandfather    Heart disease Paternal Grandfather    Hypertension Paternal Grandfather     Social History   Socioeconomic History   Marital status: Married    Spouse name: Not on file   Number of children: Not on file   Years of education: Not on file   Highest education level: Not on file  Occupational History   Not on file  Tobacco Use   Smoking status: Never   Smokeless tobacco: Never  Vaping Use   Vaping Use: Never used  Substance and Sexual Activity   Alcohol use: Yes    Comment: occ   Drug use: No   Sexual activity: Yes    Birth control/protection: None  Other Topics Concern   Not on file  Social History Narrative  Not on file   Social Determinants of Health   Financial Resource Strain: Not on file  Food Insecurity: Not on file  Transportation Needs: Not on file  Physical Activity: Not on file  Stress: Not on file  Social Connections: Not on file  Intimate Partner Violence: Not on file    Past Medical History, Surgical history, Social history, and Family history were reviewed and updated as appropriate.   Please see review of systems for further details on the patient's review from today.   Objective:   Physical Exam:  There were no vitals taken for this visit.  Physical Exam Constitutional:      General: She is not in acute distress. Musculoskeletal:        General: No deformity.  Neurological:     Mental Status: She is alert and oriented to person, place, and time.     Coordination: Coordination normal.  Psychiatric:        Attention and Perception: Attention and perception normal. She does not perceive auditory or visual hallucinations.        Mood and Affect: Mood normal. Mood is not anxious or depressed. Affect is not labile, blunt, angry or inappropriate.        Speech: Speech normal.        Behavior: Behavior normal.        Thought Content: Thought content normal. Thought  content is not paranoid or delusional. Thought content does not include homicidal or suicidal ideation. Thought content does not include homicidal or suicidal plan.        Cognition and Memory: Cognition and memory normal.        Judgment: Judgment normal.     Comments: Insight intact     Lab Review:     Component Value Date/Time   NA 137 04/07/2019 1834   K 3.8 04/07/2019 1834   CL 104 04/07/2019 1834   CO2 22 04/07/2019 1834   GLUCOSE 94 04/07/2019 1834   BUN 19 04/07/2019 1834   CREATININE 0.63 04/07/2019 1834   CALCIUM 9.1 04/07/2019 1834   PROT 7.1 10/04/2018 1940   ALBUMIN 4.1 10/04/2018 1940   AST 26 10/04/2018 1940   ALT 37 10/04/2018 1940   ALKPHOS 52 10/04/2018 1940   BILITOT 0.3 10/04/2018 1940   GFRNONAA >60 04/07/2019 1834   GFRAA >60 04/07/2019 1834       Component Value Date/Time   WBC 13.1 (H) 04/07/2019 1834   RBC 4.27 04/07/2019 1834   HGB 12.9 04/07/2019 1834   HCT 40.2 04/07/2019 1834   PLT 317 04/07/2019 1834   MCV 94.1 04/07/2019 1834   MCH 30.2 04/07/2019 1834   MCHC 32.1 04/07/2019 1834   RDW 12.1 04/07/2019 1834   LYMPHSABS 3.0 10/04/2018 1940   MONOABS 1.2 (H) 10/04/2018 1940   EOSABS 0.2 10/04/2018 1940   BASOSABS 0.1 10/04/2018 1940    No results found for: "POCLITH", "LITHIUM"   No results found for: "PHENYTOIN", "PHENOBARB", "VALPROATE", "CBMZ"   .res Assessment: Plan:    Plan:  1. Adderall 20mg  TID - taking as needed 2. Decrease Ativan 1mg  four times daily - for anxiety and panic attacks. 3. Trazadone 150mg  at hs  4. Prozac 40mg  daily 5. Add Buspar 5mg  three times daily  Monitor BP between visits  Continue therapy   RTC 3 months  Discussed potential benefits, risk, and side effects of benzodiazepines to include potential risk of tolerance and dependence, as well as possible drowsiness.  Advised patient not  to drive if experiencing drowsiness and to take lowest possible effective dose to minimize risk of dependence and  tolerance.  Discussed potential benefits, risks, and side effects of stimulants with patient to include increased heart rate, palpitations, insomnia, increased anxiety, increased irritability, or decreased appetite.  Instructed patient to contact office if experiencing any significant tolerability issues.  Diagnoses and all orders for this visit:  Major depressive disorder, recurrent episode, moderate (HCC)  Attention deficit hyperactivity disorder (ADHD), unspecified ADHD type  Obsessional thoughts  Generalized anxiety disorder -     LORazepam (ATIVAN) 1 MG tablet; TAKE ONE TABLET 4 TIMES A DAY AS NEEDED FOR ANXIETY. -     busPIRone (BUSPAR) 5 MG tablet; Take 1 tablet (5 mg total) by mouth 3 (three) times daily.  Insomnia, unspecified type     Please see After Visit Summary for patient specific instructions.  No future appointments.  No orders of the defined types were placed in this encounter.     -------------------------------

## 2022-02-18 ENCOUNTER — Ambulatory Visit (INDEPENDENT_AMBULATORY_CARE_PROVIDER_SITE_OTHER): Payer: BC Managed Care – PPO | Admitting: Behavioral Health

## 2022-02-18 DIAGNOSIS — F428 Other obsessive-compulsive disorder: Secondary | ICD-10-CM

## 2022-02-18 DIAGNOSIS — F331 Major depressive disorder, recurrent, moderate: Secondary | ICD-10-CM | POA: Diagnosis not present

## 2022-02-18 DIAGNOSIS — F411 Generalized anxiety disorder: Secondary | ICD-10-CM | POA: Diagnosis not present

## 2022-02-18 DIAGNOSIS — F322 Major depressive disorder, single episode, severe without psychotic features: Secondary | ICD-10-CM | POA: Diagnosis not present
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
## 2022-02-19 ENCOUNTER — Encounter: Payer: Self-pay | Admitting: Behavioral Health

## 2022-02-24 ENCOUNTER — Encounter: Payer: Self-pay | Admitting: Behavioral Health

## 2022-02-24 ENCOUNTER — Ambulatory Visit (INDEPENDENT_AMBULATORY_CARE_PROVIDER_SITE_OTHER): Payer: BC Managed Care – PPO | Admitting: Behavioral Health

## 2022-02-24 DIAGNOSIS — F322 Major depressive disorder, single episode, severe without psychotic features: Secondary | ICD-10-CM

## 2022-02-24 DIAGNOSIS — F428 Other obsessive-compulsive disorder: Secondary | ICD-10-CM | POA: Diagnosis not present

## 2022-02-24 DIAGNOSIS — F411 Generalized anxiety disorder: Secondary | ICD-10-CM

## 2022-02-24 DIAGNOSIS — F909 Attention-deficit hyperactivity disorder, unspecified type: Secondary | ICD-10-CM

## 2022-02-24 NOTE — Progress Notes (Signed)
[facility] Counselor/Therapist Progress Note  Patient ID: Christina [name], MRN: [id number],    Date: 02/24/2022  Time Spent: 60 minutes  Treatment Type: Individual Therapy  Reported Symptoms: Low, energy, low moods, and worrying often.   Mental Status Exam:  Appearance:   Casual     Behavior:  Appropriate  Motor:  Normal  Speech/Language:   Clear and Coherent  Affect:  Flat  Mood:  normal  Thought process:  normal  Thought content:    WNL  Sensory/Perceptual disturbances:    WNL  Orientation:  oriented to person  Attention:  Good  Concentration:  Good  Memory:  WNL  Fund of knowledge:   Good  Insight:    Good  Judgment:   Good  Impulse Control:  Good   Risk Assessment: Danger to Self:  No Self-injurious Behavior: No Danger to Others: No Duty to Warn:no Physical Aggression / Violence:No  Access to Firearms a concern: No  Gang Involvement:No   Subjective: The counselor engaged the client in a therapeutic conversation utilizing open ended questions and reflective listening. The patient reports going to the beach since last being seen and states "It helped settle my soul". She states she attended a wedding. She reports her friend died last night from a terminal illness.The patient reports the movers are taking the rest of her mother's belongings to be donated. She states she received her moms cooking utensils. She expressed gratitude for receiving the cooking utensils and then stated "That's probably weird". The counselor offered support and encouragement for the patient expressing gratitude for receiving the cooking utensils. The patient reports having feelings of sadness and anger for her mother. She reports feeling hurt for not having her mother be a mom to her the way she wanted.   The patient expressed having symptoms of low energy, low moods and worrying. She reports not having energy to do anything. She states she recently learned her vitamin D is low and has  been prescribed a supplement. In addition, the patient states she is being seen by a rheumatologist due to experiencing inflammation. She states she has changed her diet to an anti-inflammatory diet and states she is feeling better "Less lethargic". The patient reports having a low mood due to finding out her friend died last night.    She reports concerns regarding her sister having surgery due to her sisters past mental health issues. She reports plans to soon visit with her sister to help her sister recover from her upcoming surgery. The patient reports she is not practicing self care. She experienced a panic attack last week and identified thoughts that triggered her panic attack as feeling sad that their was not much left of her mother's personal belonging.The patient reports plans to follow up with her lawyer for legal counsel due to recently being served her divocre papers.The patient expressed concerns of how she will be viewed regarding decisions she make involving her divorce.   The counselor discussed and encouraged the patient to practice self care.The counselor discussed grief and loss. The counselor educated the patient on irrational thinking and challenging irrational thoughts to help the patient manage her mental health.The counselor discussed coping skills to assit the patient with managing anxiety and depression. The counselor discussed deep breathing technique and anticipatory anxiety.The counselor validated the patients support received from her significant other.She states her significant other encouraged her to attend a counseling session today. The patient reports plans to attend her next session in  person after she returns from her visit with her sister instead of completing a telehealth visit.   Interventions: Cognitive Behavioral Therapy  Diagnosis:   ICD-10-CM   1. Attention deficit hyperactivity disorder (ADHD), unspecified ADHD type  F90.9     2. Current severe episode of  major depressive disorder without psychotic features, unspecified whether recurrent ([place])  F32.2     3. GAD (generalized anxiety disorder)  F41.1     4. Obsessional thoughts  F42.8       Plan:   1) Long term goal: Learn and implement coping skills that result in a reduction of anxiety and worry, and improved daily functioning.    Short term goal: Develop strategies to reduce symptoms, or reduce anxiety and improve coping skills.    Objective: Learn two new ways of coping with routine stressors.   Objective: Report feeling more positive about self and abilities during therapy sessions.    2) Long term goal: Develop healthy thinking patterns and beliefs about self, others, and the world that lead to alleviation and help prevent the relapse of depression.    Short term goal: Improve overall mood.   Objective: Develop strategies for thought distraction when ruminating on the past daily.      Jannifer Hick, [facility]

## 2022-02-26 DIAGNOSIS — R5383 Other fatigue: Secondary | ICD-10-CM | POA: Diagnosis not present

## 2022-02-26 DIAGNOSIS — R7982 Elevated C-reactive protein (CRP): Secondary | ICD-10-CM | POA: Diagnosis not present

## 2022-02-26 DIAGNOSIS — Z79899 Other long term (current) drug therapy: Secondary | ICD-10-CM | POA: Diagnosis not present

## 2022-02-26 DIAGNOSIS — M2540 Effusion, unspecified joint: Secondary | ICD-10-CM | POA: Diagnosis not present

## 2022-02-26 DIAGNOSIS — R21 Rash and other nonspecific skin eruption: Secondary | ICD-10-CM | POA: Diagnosis not present

## 2022-03-03 ENCOUNTER — Encounter: Payer: Self-pay | Admitting: Adult Health

## 2022-03-03 ENCOUNTER — Telehealth (INDEPENDENT_AMBULATORY_CARE_PROVIDER_SITE_OTHER): Payer: BC Managed Care – PPO | Admitting: Adult Health

## 2022-03-03 DIAGNOSIS — F322 Major depressive disorder, single episode, severe without psychotic features: Secondary | ICD-10-CM | POA: Diagnosis not present

## 2022-03-03 DIAGNOSIS — G4700 Insomnia, unspecified: Secondary | ICD-10-CM

## 2022-03-03 DIAGNOSIS — F428 Other obsessive-compulsive disorder: Secondary | ICD-10-CM

## 2022-03-03 DIAGNOSIS — F909 Attention-deficit hyperactivity disorder, unspecified type: Secondary | ICD-10-CM

## 2022-03-03 DIAGNOSIS — F411 Generalized anxiety disorder: Secondary | ICD-10-CM

## 2022-03-03 MED ORDER — AMPHETAMINE-DEXTROAMPHETAMINE 20 MG PO TABS: 20.0000 mg | ORAL_TABLET | Freq: Two times a day (BID) | ORAL | 0 refills | Status: DC

## 2022-03-03 MED ORDER — BUSPIRONE HCL 10 MG PO TABS: 10.0000 mg | ORAL_TABLET | Freq: Three times a day (TID) | ORAL | 2 refills | Status: DC

## 2022-03-03 NOTE — Progress Notes (Signed)
Christina [name] [id number] 1985/07/11 36 y.o.  Virtual Visit via Video Note  I connected with pt @ on 03/03/22 at  1:20 PM EDT by a video enabled telemedicine application and verified that I am speaking with the correct person using two identifiers.   I discussed the limitations of evaluation and management by telemedicine and the availability of in person appointments. The patient expressed understanding and agreed to proceed.  I discussed the assessment and treatment plan with the patient. The patient was provided an opportunity to ask questions and all were answered. The patient agreed with the plan and demonstrated an understanding of the instructions.   The patient was advised to call back or seek an in-person evaluation if the symptoms worsen or if the condition fails to improve as anticipated.  I provided 25 minutes of non-face-to-face time during this encounter.  The patient was located at home.  The provider was located at [facility].   Christina Gell, Christina [name]   Subjective:   Patient ID:  Christina [name] is a 36 y.o. (DOB 1985-07-16) female.  Chief Complaint: No chief complaint on file.   HPI Christina [name] presents for follow-up of GAD, MDD, insomnia, obsessional thoughts and ADHD.  Describes mood today as "better". Pleasant. Denies tearfulness. Mood symptoms - reports decreased depression and anxiety. Reports irritability at times. Reports decreased panic attacks - more like anxiety attacks. Reports worry and rumination. Stating "I'm feeling better than I did". Stating "I can see the light at the end of the tunnel". Sister healing from thyroidectomy - has cancer - staying with sister in [place] for 7 days. Doing better as far as using coping skills. Working with therapist. Has been interviewing - 2nd interview. Children doing well. Feels like current medications are helpful. Seeing a therapist. Stable interest and motivation. Taking medications as prescribed.  Energy  levels improving. Active, does not have a regular exercise routine.   Enjoys some usual interests and activities. Single. Lives alone with lives with 3 children. Appetite fluctuates. Weight fluctuates by 10 pounds. Sleeping well most nights. Averages 5 or more hours with Trazadone. Reports some daytime napping - once or twice a week. Focus and concentration variable. Completing tasks. Managing aspects of household. Unemployed - applying for jobs. Denies SI or HI.  Denies AH or VH. Denies self harm. Denies substance use.   Review of Systems:  Review of Systems  Musculoskeletal:  Negative for gait problem.  Neurological:  Negative for tremors.  Psychiatric/Behavioral:         Please refer to HPI    Medications: I have reviewed the patient's current medications.  Current Outpatient Medications  Medication Sig Dispense Refill   amphetamine-dextroamphetamine (ADDERALL) 20 MG tablet Take one tablet three times daily. (Patient not taking: Reported on 09/23/2021) 90 tablet 0   amphetamine-dextroamphetamine (ADDERALL) 20 MG tablet Take 1 tablet (20 mg total) by mouth 2 (two) times daily. 60 tablet 0   amphetamine-dextroamphetamine (ADDERALL) 20 MG tablet Take 1 tablet (20 mg total) by mouth 2 (two) times daily. 60 tablet 0   amphetamine-dextroamphetamine (ADDERALL) 20 MG tablet Take 1 tablet (20 mg total) by mouth 2 (two) times daily. 60 tablet 0   busPIRone (BUSPAR) 10 MG tablet Take 1 tablet (10 mg total) by mouth 3 (three) times daily. 90 tablet 2   FLUoxetine (PROZAC) 40 MG capsule TAKE 1 CAPSULE (40 MG TOTAL) BY MOUTH DAILY. 90 capsule 3   loratadine (CLARITIN) 10 MG tablet Take by mouth. (Patient  not taking: Reported on 09/23/2021)     LORazepam (ATIVAN) 1 MG tablet TAKE ONE TABLET 4 TIMES A DAY AS NEEDED FOR ANXIETY. 120 tablet 2   metoprolol tartrate (LOPRESSOR) 25 MG tablet TAKE 1 TABLET BY MOUTH TWICE A DAY. MAY TAKE AN EXTRA ONE TABLET AS NEEDED FOR INCREASED HEART RATE. 225 tablet 2    Multiple Vitamin (MULTIVITAMIN WITH MINERALS) TABS tablet Take 1 tablet by mouth daily.     traZODone (DESYREL) 100 MG tablet TAKE 1 TO 2 TABLETS BY MOUTH EVERY DAY AT BEDTIME FOR SLEEP 180 tablet 3   No current facility-administered medications for this visit.    Medication Side Effects: None  Allergies:  Allergies  Allergen Reactions   Sulfa Antibiotics Hives, Itching and Swelling   Latex Itching   Polymyxin B     Other reaction(s): eye redness/pain   Polymyxin B-Trimethoprim     Other reaction(s): eye redness/pain    Past Medical History:  Diagnosis Date   Anemia    borderline anemic   Anxiety    Depression    Eczema    GERD (gastroesophageal reflux disease)    with pregnancy   History of hip fracture 10/2010   still has pain, right hip   Hyperemesis arising during pregnancy    Increased heart rate    resting heart rate 100-120   Low energy    on bedrest during pregnancy trying to regain strength   SVD (spontaneous vaginal delivery) 12/16/2015   SVT (supraventricular tachycardia)    UTI (lower urinary tract infection)    Vaginitis and vulvovaginitis     Family History  Problem Relation Age of Onset   Alcohol abuse Mother    Anxiety disorder Mother    Asthma Mother    Mental illness Mother        bipolar   Depression Mother    Hypothyroidism Mother    Ovarian cysts Mother    Hypertension Father    Endometriosis Sister    Ovarian cysts Sister    Alcohol abuse Brother    Crohn's disease Brother    Alcohol abuse Maternal Aunt    Cancer Maternal Aunt        breast   Hypothyroidism Maternal Aunt    Miscarriages / Stillbirths Maternal Aunt        stillbirth   Alcohol abuse Maternal Uncle    Asthma Maternal Grandmother    Mental illness Maternal Grandmother        bipolar   Asthma Maternal Grandfather    Diabetes Maternal Grandfather    Hypertension Maternal Grandfather    Asthma Paternal Grandmother    Hypertension Paternal Grandmother    Asthma  Paternal Grandfather    Diabetes Paternal Grandfather    Heart disease Paternal Grandfather    Hypertension Paternal Grandfather     Social History   Socioeconomic History   Marital status: Married    Spouse name: Not on file   Number of children: Not on file   Years of education: Not on file   Highest education level: Not on file  Occupational History   Not on file  Tobacco Use   Smoking status: Never   Smokeless tobacco: Never  Vaping Use   Vaping Use: Never used  Substance and Sexual Activity   Alcohol use: Yes    Comment: occ   Drug use: No   Sexual activity: Yes    Birth control/protection: None  Other Topics Concern   Not on file  Social History Narrative   Not on file   Social Determinants of Health   Financial Resource Strain: Not on file  Food Insecurity: Not on file  Transportation Needs: Not on file  Physical Activity: Not on file  Stress: Not on file  Social Connections: Not on file  Intimate Partner Violence: Not on file    Past Medical History, Surgical history, Social history, and Family history were reviewed and updated as appropriate.   Please see review of systems for further details on the patient's review from today.   Objective:   Physical Exam:  There were no vitals taken for this visit.  Physical Exam Constitutional:      General: She is not in acute distress. Musculoskeletal:        General: No deformity.  Neurological:     Mental Status: She is alert and oriented to person, place, and time.     Coordination: Coordination normal.  Psychiatric:        Attention and Perception: Attention and perception normal. She does not perceive auditory or visual hallucinations.        Mood and Affect: Mood normal. Mood is not anxious or depressed. Affect is not labile, blunt, angry or inappropriate.        Speech: Speech normal.        Behavior: Behavior normal.        Thought Content: Thought content normal. Thought content is not paranoid  or delusional. Thought content does not include homicidal or suicidal ideation. Thought content does not include homicidal or suicidal plan.        Cognition and Memory: Cognition and memory normal.        Judgment: Judgment normal.     Comments: Insight intact     Lab Review:     Component Value Date/Time   NA 137 04/07/2019 1834   K 3.8 04/07/2019 1834   CL 104 04/07/2019 1834   CO2 22 04/07/2019 1834   GLUCOSE 94 04/07/2019 1834   BUN 19 04/07/2019 1834   CREATININE 0.63 04/07/2019 1834   CALCIUM 9.1 04/07/2019 1834   PROT 7.1 10/04/2018 1940   ALBUMIN 4.1 10/04/2018 1940   AST 26 10/04/2018 1940   ALT 37 10/04/2018 1940   ALKPHOS 52 10/04/2018 1940   BILITOT 0.3 10/04/2018 1940   GFRNONAA >60 04/07/2019 1834   GFRAA >60 04/07/2019 1834       Component Value Date/Time   WBC 13.1 (H) 04/07/2019 1834   RBC 4.27 04/07/2019 1834   HGB 12.9 04/07/2019 1834   HCT 40.2 04/07/2019 1834   PLT 317 04/07/2019 1834   MCV 94.1 04/07/2019 1834   MCH 30.2 04/07/2019 1834   MCHC 32.1 04/07/2019 1834   RDW 12.1 04/07/2019 1834   LYMPHSABS 3.0 10/04/2018 1940   MONOABS 1.2 (H) 10/04/2018 1940   EOSABS 0.2 10/04/2018 1940   BASOSABS 0.1 10/04/2018 1940    No results found for: "POCLITH", "LITHIUM"   No results found for: "PHENYTOIN", "PHENOBARB", "VALPROATE", "CBMZ"   .res Assessment: Plan:    Plan:  Adderall 20mg  TID - taking as needed Ativan 1mg  four times daily - for anxiety and panic attacks Trazadone 150mg  at hs  Prozac 40mg  daily Increase Buspar 5mg  to 10mg  three times daily.  Monitor BP between visits  Continue therapy   RTC 3 months  Discussed potential benefits, risk, and side effects of benzodiazepines to include potential risk of tolerance and dependence, as well as possible drowsiness.  Advised patient  not to drive if experiencing drowsiness and to take lowest possible effective dose to minimize risk of dependence and tolerance.  Discussed potential  benefits, risks, and side effects of stimulants with patient to include increased heart rate, palpitations, insomnia, increased anxiety, increased irritability, or decreased appetite.  Instructed patient to contact office if experiencing any significant tolerability issues.  Diagnoses and all orders for this visit:  Attention deficit hyperactivity disorder (ADHD), unspecified ADHD type  Generalized anxiety disorder -     busPIRone (BUSPAR) 10 MG tablet; Take 1 tablet (10 mg total) by mouth 3 (three) times daily.  Current severe episode of major depressive disorder without psychotic features, unspecified whether recurrent ([facility])  Obsessional thoughts  Insomnia, unspecified type     Please see After Visit Summary for patient specific instructions.  No future appointments.   No orders of the defined types were placed in this encounter.     -------------------------------

## 2022-03-09 DIAGNOSIS — L299 Pruritus, unspecified: Secondary | ICD-10-CM | POA: Diagnosis not present

## 2022-03-09 DIAGNOSIS — H9201 Otalgia, right ear: Secondary | ICD-10-CM | POA: Diagnosis not present

## 2022-03-09 DIAGNOSIS — M2669 Other specified disorders of temporomandibular joint: Secondary | ICD-10-CM | POA: Diagnosis not present

## 2022-03-13 DIAGNOSIS — R21 Rash and other nonspecific skin eruption: Secondary | ICD-10-CM | POA: Diagnosis not present

## 2022-03-13 DIAGNOSIS — M2540 Effusion, unspecified joint: Secondary | ICD-10-CM | POA: Diagnosis not present

## 2022-03-13 DIAGNOSIS — R7982 Elevated C-reactive protein (CRP): Secondary | ICD-10-CM | POA: Diagnosis not present

## 2022-03-13 DIAGNOSIS — R5383 Other fatigue: Secondary | ICD-10-CM | POA: Diagnosis not present

## 2022-03-31 ENCOUNTER — Encounter: Payer: Self-pay | Admitting: Adult Health

## 2022-03-31 ENCOUNTER — Telehealth (INDEPENDENT_AMBULATORY_CARE_PROVIDER_SITE_OTHER): Payer: BC Managed Care – PPO | Admitting: Adult Health

## 2022-03-31 DIAGNOSIS — F428 Other obsessive-compulsive disorder: Secondary | ICD-10-CM

## 2022-03-31 DIAGNOSIS — F331 Major depressive disorder, recurrent, moderate: Secondary | ICD-10-CM

## 2022-03-31 DIAGNOSIS — G4700 Insomnia, unspecified: Secondary | ICD-10-CM

## 2022-03-31 DIAGNOSIS — F411 Generalized anxiety disorder: Secondary | ICD-10-CM

## 2022-03-31 DIAGNOSIS — F909 Attention-deficit hyperactivity disorder, unspecified type: Secondary | ICD-10-CM | POA: Diagnosis not present

## 2022-03-31 NOTE — Progress Notes (Signed)
Christina [name] [id number] 1985-09-14 36 y.o.  Virtual Visit via Video Note  I connected with pt @ on 03/31/22 at  8:00 AM EST by a video enabled telemedicine application and verified that I am speaking with the correct person using two identifiers.   I discussed the limitations of evaluation and management by telemedicine and the availability of in person appointments. The patient expressed understanding and agreed to proceed.  I discussed the assessment and treatment plan with the patient. The patient was provided an opportunity to ask questions and all were answered. The patient agreed with the plan and demonstrated an understanding of the instructions.   The patient was advised to call back or seek an in-person evaluation if the symptoms worsen or if the condition fails to improve as anticipated.  I provided 25 minutes of non-face-to-face time during this encounter.  The patient was located at home.  The provider was located at [facility].   Dorothyann Gibbs, NP   Subjective:   Patient ID:  Christina [name] is a 36 y.o. (DOB Sep 09, 1985) female.  Chief Complaint: No chief complaint on file.   HPI TEHILA SOKOLOW presents for follow-up of GAD, MDD, insomnia, obsessional thoughts and ADHD.  Describes mood today as "progressing". Pleasant. Denies tearfulness. Mood symptoms - reports decreased depression, irritability and anxiety. Reports decreased panic attacks. Reports situational anxiety attacks - once a week. Reports worry and rumination. Stating "I do feel better". Having some difficulties with the holiday season. Sister doing better. Working with therapist. Has been interviewing for jobs. Children doing well. Feels like current medications are helpful. Stable interest and motivation. Taking medications as prescribed.  Energy levels improving. Active, does not have a regular exercise routine.   Enjoys some usual interests and activities. Single. Lives alone with lives  with 3 children. Appetite adequate. Weight gain - 10 to 15 pounds. Sleeping well most nights with Trazadone. Averages 8 to 10 hours. Denies daytime napping. Focus and concentration stable - using Adderall as needed. Completing tasks. Managing aspects of household. Unemployed - applying for jobs. Denies SI or HI.  Denies AH or VH. Denies self harm. Denies substance use.   Review of Systems:  Review of Systems  Musculoskeletal:  Negative for gait problem.  Neurological:  Negative for tremors.  Psychiatric/Behavioral:         Please refer to HPI    Medications: I have reviewed the patient's current medications.  Current Outpatient Medications  Medication Sig Dispense Refill   amphetamine-dextroamphetamine (ADDERALL) 20 MG tablet Take one tablet three times daily. (Patient not taking: Reported on 09/23/2021) 90 tablet 0   amphetamine-dextroamphetamine (ADDERALL) 20 MG tablet Take 1 tablet (20 mg total) by mouth 2 (two) times daily. 60 tablet 0   amphetamine-dextroamphetamine (ADDERALL) 20 MG tablet Take 1 tablet (20 mg total) by mouth 2 (two) times daily. 60 tablet 0   amphetamine-dextroamphetamine (ADDERALL) 20 MG tablet Take 1 tablet (20 mg total) by mouth 2 (two) times daily. 60 tablet 0   busPIRone (BUSPAR) 10 MG tablet Take 1 tablet (10 mg total) by mouth 3 (three) times daily. 90 tablet 2   FLUoxetine (PROZAC) 40 MG capsule TAKE 1 CAPSULE (40 MG TOTAL) BY MOUTH DAILY. 90 capsule 3   loratadine (CLARITIN) 10 MG tablet Take by mouth. (Patient not taking: Reported on 09/23/2021)     LORazepam (ATIVAN) 1 MG tablet TAKE ONE TABLET 4 TIMES A DAY AS NEEDED FOR ANXIETY. 120 tablet 2   metoprolol tartrate (  LOPRESSOR) 25 MG tablet TAKE 1 TABLET BY MOUTH TWICE A DAY. MAY TAKE AN EXTRA ONE TABLET AS NEEDED FOR INCREASED HEART RATE. 225 tablet 2   Multiple Vitamin (MULTIVITAMIN WITH MINERALS) TABS tablet Take 1 tablet by mouth daily.     traZODone (DESYREL) 100 MG tablet TAKE 1 TO 2 TABLETS BY  MOUTH EVERY DAY AT BEDTIME FOR SLEEP 180 tablet 3   No current facility-administered medications for this visit.    Medication Side Effects: None  Allergies:  Allergies  Allergen Reactions   Sulfa Antibiotics Hives, Itching and Swelling   Latex Itching   Polymyxin B     Other reaction(s): eye redness/pain   Polymyxin B-Trimethoprim     Other reaction(s): eye redness/pain    Past Medical History:  Diagnosis Date   Anemia    borderline anemic   Anxiety    Depression    Eczema    GERD (gastroesophageal reflux disease)    with pregnancy   History of hip fracture 10/2010   still has pain, right hip   Hyperemesis arising during pregnancy    Increased heart rate    resting heart rate 100-120   Low energy    on bedrest during pregnancy trying to regain strength   SVD (spontaneous vaginal delivery) 12/16/2015   SVT (supraventricular tachycardia)    UTI (lower urinary tract infection)    Vaginitis and vulvovaginitis     Family History  Problem Relation Age of Onset   Alcohol abuse Mother    Anxiety disorder Mother    Asthma Mother    Mental illness Mother        bipolar   Depression Mother    Hypothyroidism Mother    Ovarian cysts Mother    Hypertension Father    Endometriosis Sister    Ovarian cysts Sister    Alcohol abuse Brother    Crohn's disease Brother    Alcohol abuse Maternal Aunt    Cancer Maternal Aunt        breast   Hypothyroidism Maternal Aunt    Miscarriages / Stillbirths Maternal Aunt        stillbirth   Alcohol abuse Maternal Uncle    Asthma Maternal Grandmother    Mental illness Maternal Grandmother        bipolar   Asthma Maternal Grandfather    Diabetes Maternal Grandfather    Hypertension Maternal Grandfather    Asthma Paternal Grandmother    Hypertension Paternal Grandmother    Asthma Paternal Grandfather    Diabetes Paternal Grandfather    Heart disease Paternal Grandfather    Hypertension Paternal Grandfather     Social History    Socioeconomic History   Marital status: Married    Spouse name: Not on file   Number of children: Not on file   Years of education: Not on file   Highest education level: Not on file  Occupational History   Not on file  Tobacco Use   Smoking status: Never   Smokeless tobacco: Never  Vaping Use   Vaping Use: Never used  Substance and Sexual Activity   Alcohol use: Yes    Comment: occ   Drug use: No   Sexual activity: Yes    Birth control/protection: None  Other Topics Concern   Not on file  Social History Narrative   Not on file   Social Determinants of Health   Financial Resource Strain: Not on file  Food Insecurity: Not on file  Transportation Needs: Not  on file  Physical Activity: Not on file  Stress: Not on file  Social Connections: Not on file  Intimate Partner Violence: Not on file    Past Medical History, Surgical history, Social history, and Family history were reviewed and updated as appropriate.   Please see review of systems for further details on the patient's review from today.   Objective:   Physical Exam:  There were no vitals taken for this visit.  Physical Exam Constitutional:      General: She is not in acute distress. Musculoskeletal:        General: No deformity.  Neurological:     Mental Status: She is alert and oriented to person, place, and time.     Coordination: Coordination normal.  Psychiatric:        Attention and Perception: Attention and perception normal. She does not perceive auditory or visual hallucinations.        Mood and Affect: Mood normal. Mood is not anxious or depressed. Affect is not labile, blunt, angry or inappropriate.        Speech: Speech normal.        Behavior: Behavior normal.        Thought Content: Thought content normal. Thought content is not paranoid or delusional. Thought content does not include homicidal or suicidal ideation. Thought content does not include homicidal or suicidal plan.         Cognition and Memory: Cognition and memory normal.        Judgment: Judgment normal.     Comments: Insight intact     Lab Review:     Component Value Date/Time   NA 137 04/07/2019 1834   K 3.8 04/07/2019 1834   CL 104 04/07/2019 1834   CO2 22 04/07/2019 1834   GLUCOSE 94 04/07/2019 1834   BUN 19 04/07/2019 1834   CREATININE 0.63 04/07/2019 1834   CALCIUM 9.1 04/07/2019 1834   PROT 7.1 10/04/2018 1940   ALBUMIN 4.1 10/04/2018 1940   AST 26 10/04/2018 1940   ALT 37 10/04/2018 1940   ALKPHOS 52 10/04/2018 1940   BILITOT 0.3 10/04/2018 1940   GFRNONAA >60 04/07/2019 1834   GFRAA >60 04/07/2019 1834       Component Value Date/Time   WBC 13.1 (H) 04/07/2019 1834   RBC 4.27 04/07/2019 1834   HGB 12.9 04/07/2019 1834   HCT 40.2 04/07/2019 1834   PLT 317 04/07/2019 1834   MCV 94.1 04/07/2019 1834   MCH 30.2 04/07/2019 1834   MCHC 32.1 04/07/2019 1834   RDW 12.1 04/07/2019 1834   LYMPHSABS 3.0 10/04/2018 1940   MONOABS 1.2 (H) 10/04/2018 1940   EOSABS 0.2 10/04/2018 1940   BASOSABS 0.1 10/04/2018 1940    No results found for: "POCLITH", "LITHIUM"   No results found for: "PHENYTOIN", "PHENOBARB", "VALPROATE", "CBMZ"   .res Assessment: Plan:    Plan:  Adderall 20mg  TID - taking as needed - 3 fills this year Ativan 1mg  four times daily - for anxiety and panic attacks Trazadone 100mg  - taking 2 at hs  Prozac 40mg  daily D/C Buspar 10mg  three times daily - did not tolerate   Monitor BP between visits   Continue therapy   RTC 4 weeks  Discussed potential benefits, risk, and side effects of benzodiazepines to include potential risk of tolerance and dependence, as well as possible drowsiness.  Advised patient not to drive if experiencing drowsiness and to take lowest possible effective dose to minimize risk of dependence and tolerance.  Discussed potential benefits, risks, and side effects of stimulants with patient to include increased heart rate, palpitations,  insomnia, increased anxiety, increased irritability, or decreased appetite.  Instructed patient to contact office if experiencing any significant tolerability issues.  Diagnoses and all orders for this visit:  Major depressive disorder, recurrent episode, moderate (HCC)  Generalized anxiety disorder  Attention deficit hyperactivity disorder (ADHD), unspecified ADHD type  Insomnia, unspecified type  Obsessional thoughts     Please see After Visit Summary for patient specific instructions.  Future Appointments  Date Time Provider Department Center  04/01/2022  9:00 AM Clydia Llano, [facility] None    No orders of the defined types were placed in this encounter.     -------------------------------

## 2022-04-01 ENCOUNTER — Ambulatory Visit: Payer: BC Managed Care – PPO | Admitting: Behavioral Health

## 2022-04-01 ENCOUNTER — Other Ambulatory Visit: Payer: Self-pay | Admitting: Adult Health

## 2022-04-01 DIAGNOSIS — F411 Generalized anxiety disorder: Secondary | ICD-10-CM

## 2022-04-01 DIAGNOSIS — H1033 Unspecified acute conjunctivitis, bilateral: Secondary | ICD-10-CM | POA: Diagnosis not present

## 2022-04-13 ENCOUNTER — Ambulatory Visit: Payer: BC Managed Care – PPO | Admitting: Behavioral Health

## 2022-04-20 DIAGNOSIS — H5713 Ocular pain, bilateral: Secondary | ICD-10-CM | POA: Diagnosis not present

## 2022-04-29 DIAGNOSIS — S63501A Unspecified sprain of right wrist, initial encounter: Secondary | ICD-10-CM | POA: Diagnosis not present

## 2022-07-04 ENCOUNTER — Other Ambulatory Visit: Payer: Self-pay

## 2022-07-04 ENCOUNTER — Emergency Department (HOSPITAL_COMMUNITY)
Admission: EM | Admit: 2022-07-04 | Discharge: 2022-07-04 | Disposition: A | Discharge status: Home / Self Care | Payer: BC Managed Care – PPO | Attending: Emergency Medicine | Admitting: Emergency Medicine

## 2022-07-04 ENCOUNTER — Encounter (HOSPITAL_COMMUNITY): Payer: Self-pay | Admitting: *Deleted

## 2022-07-04 ENCOUNTER — Emergency Department (HOSPITAL_COMMUNITY): Payer: BC Managed Care – PPO

## 2022-07-04 DIAGNOSIS — M79671 Pain in right foot: Secondary | ICD-10-CM | POA: Diagnosis not present

## 2022-07-04 DIAGNOSIS — S93601A Unspecified sprain of right foot, initial encounter: Secondary | ICD-10-CM

## 2022-07-04 DIAGNOSIS — Z91040 Latex allergy status: Secondary | ICD-10-CM | POA: Diagnosis not present

## 2022-07-04 DIAGNOSIS — M7989 Other specified soft tissue disorders: Secondary | ICD-10-CM | POA: Diagnosis not present

## 2022-07-04 DIAGNOSIS — S93401A Sprain of unspecified ligament of right ankle, initial encounter: Secondary | ICD-10-CM

## 2022-07-04 DIAGNOSIS — S99911A Unspecified injury of right ankle, initial encounter: Secondary | ICD-10-CM | POA: Diagnosis not present

## 2022-07-04 DIAGNOSIS — X501XXA Overexertion from prolonged static or awkward postures, initial encounter: Secondary | ICD-10-CM | POA: Insufficient documentation

## 2022-07-04 NOTE — ED Provider Notes (Signed)
[facility] Provider Note   CSN: [id number] Arrival date & time: 07/04/22  1720     History  Chief Complaint  Patient presents with   Foot Injury    Christina [name] is a 37 y.o. female.  Patient here with right foot pain for last couple weeks after injury.  Still have a lot of discomfort when she walks.  Nothing makes it worse or better.  Denies any chest pain or shortness of breath or weakness or tingling.  She states that after walking for a long time she starts to get discomfort.  The history is provided by the patient.       Home Medications Prior to Admission medications   Medication Sig Start Date End Date Taking? Authorizing Provider  amphetamine-dextroamphetamine (ADDERALL) 20 MG tablet Take one tablet three times daily. Patient not taking: Reported on 09/23/2021 11/14/19   Mozingo, Berdie Ogren, NP  amphetamine-dextroamphetamine (ADDERALL) 20 MG tablet Take 1 tablet (20 mg total) by mouth 2 (two) times daily. 12/12/21   Mozingo, Berdie Ogren, NP  amphetamine-dextroamphetamine (ADDERALL) 20 MG tablet Take 1 tablet (20 mg total) by mouth 2 (two) times daily. 01/09/22   Mozingo, Berdie Ogren, NP  amphetamine-dextroamphetamine (ADDERALL) 20 MG tablet Take 1 tablet (20 mg total) by mouth 2 (two) times daily. 03/03/22   Mozingo, Berdie Ogren, NP  busPIRone (BUSPAR) 10 MG tablet Take 1 tablet (10 mg total) by mouth 3 (three) times daily. 03/03/22   Mozingo, Berdie Ogren, NP  FLUoxetine (PROZAC) 40 MG capsule TAKE 1 CAPSULE (40 MG TOTAL) BY MOUTH DAILY. 09/10/21   Mozingo, Berdie Ogren, NP  loratadine (CLARITIN) 10 MG tablet Take by mouth. Patient not taking: Reported on 09/23/2021    [provider]  LORazepam (ATIVAN) 1 MG tablet TAKE ONE TABLET 4 TIMES A DAY AS NEEDED FOR ANXIETY. 02/17/22   Mozingo, Berdie Ogren, NP  metoprolol tartrate (LOPRESSOR) 25 MG tablet TAKE 1 TABLET BY MOUTH TWICE A DAY. MAY TAKE AN  EXTRA ONE TABLET AS NEEDED FOR INCREASED HEART RATE. 11/25/21   Evans Lance, MD  Multiple Vitamin (MULTIVITAMIN WITH MINERALS) TABS tablet Take 1 tablet by mouth daily.    [provider]  traZODone (DESYREL) 100 MG tablet TAKE 1 TO 2 TABLETS BY MOUTH EVERY DAY AT BEDTIME FOR SLEEP 07/04/21   Mozingo, Berdie Ogren, NP      Allergies    Sulfa antibiotics, Latex, Polymyxin b, and Polymyxin b-trimethoprim    Review of Systems   Review of Systems  Physical Exam Updated Vital Signs BP 110/72 (BP Location: Right Arm)   Pulse 82   Temp 97.8 F (36.6 C) (Oral)   Resp 20   Ht '5\' 7"'$  (1.702 m)   Wt 103.4 kg   LMP 07/04/2022   SpO2 98%   BMI 35.70 kg/m  Physical Exam HENT:     Head: Normocephalic.  Cardiovascular:     Pulses: Normal pulses.     Heart sounds: Normal heart sounds.  Musculoskeletal:        General: Tenderness present. No swelling. Normal range of motion.     Cervical back: Normal range of motion.     Comments: Tenderness over the right midfoot but no obvious deformity or swelling  Skin:    General: Skin is warm.  Neurological:     General: No focal deficit present.     Mental Status: She is alert.     Sensory: No sensory deficit.  Motor: No weakness.  Psychiatric:        Mood and Affect: Mood normal.     ED Results / Procedures / Treatments   Labs (all labs ordered are listed, but only abnormal results are displayed) Labs Reviewed - No data to display  EKG None  Radiology DG Foot Complete Right  Result Date: 07/04/2022 CLINICAL DATA:  Pain EXAM: RIGHT FOOT COMPLETE - 3 VIEW; RIGHT ANKLE - COMPLETE 3 VIEW COMPARISON:  12/26/2012 FINDINGS: There is no evidence of fracture, dislocation, or subluxation. There is no evidence of arthropathy or other focal bone abnormality. Small posterior calcaneal spur. Soft tissue swelling noted at the lateral malleolus. IMPRESSION: Mild soft tissue swelling laterally. No acute osseous abnormality identified.  Electronically Signed   By: Sammie Bench M.D.   On: 07/04/2022 18:01   DG Ankle Complete Right  Result Date: 07/04/2022 CLINICAL DATA:  Pain EXAM: RIGHT FOOT COMPLETE - 3 VIEW; RIGHT ANKLE - COMPLETE 3 VIEW COMPARISON:  12/26/2012 FINDINGS: There is no evidence of fracture, dislocation, or subluxation. There is no evidence of arthropathy or other focal bone abnormality. Small posterior calcaneal spur. Soft tissue swelling noted at the lateral malleolus. IMPRESSION: Mild soft tissue swelling laterally. No acute osseous abnormality identified. Electronically Signed   By: Sammie Bench M.D.   On: 07/04/2022 18:01    Procedures Procedures    Medications Ordered in ED Medications - No data to display  ED Course/ Medical Decision Making/ A&P                             Medical Decision Making Amount and/or Complexity of Data Reviewed Radiology: ordered.   Christina [name] is here with right foot and ankle pain after injury a couple weeks ago.  X-rays obtained per my review interpretation no obvious fracture.  Radiology report states maybe there is a small posterior calcaneal spur.  Some swelling to the lateral malleolus.  Overall suspect that she is suffering from a foot and ankle sprain.  Will put her in a walking boot.  Recommend continued use of ice and Tylenol ibuprofen.  Suspect she will get better with time.  Will have her follow-up with orthopedics.  Discharged in good condition.  Neurovascular neuromuscular intact.  This chart was dictated using voice recognition software.  Despite best efforts to proofread,  errors can occur which can change the documentation meaning.         Final Clinical Impression(s) / ED Diagnoses Final diagnoses:  Sprain of right foot, initial encounter  Sprain of right ankle, unspecified ligament, initial encounter    Rx / DC Orders ED Discharge Orders     None         Lennice Sites, DO 07/04/22 1842

## 2022-07-04 NOTE — Discharge Instructions (Signed)
Use walking boot for comfort.  Recommend Tylenol and ibuprofen and ice.  My suspicion is that you have a sprain that will generally take about 4 to 6 weeks to heal.  Follow-up with orthopedics.

## 2022-07-13 ENCOUNTER — Telehealth: Payer: Self-pay | Admitting: Adult Health

## 2022-07-13 ENCOUNTER — Other Ambulatory Visit: Payer: Self-pay

## 2022-07-13 DIAGNOSIS — F428 Other obsessive-compulsive disorder: Secondary | ICD-10-CM

## 2022-07-13 DIAGNOSIS — F411 Generalized anxiety disorder: Secondary | ICD-10-CM

## 2022-07-13 MED ORDER — LORAZEPAM 1 MG PO TABS: ORAL_TABLET | ORAL | 0 refills | Status: DC

## 2022-07-13 MED ORDER — FLUOXETINE HCL 40 MG PO CAPS: 40.0000 mg | ORAL_CAPSULE | Freq: Every day | ORAL | 0 refills | Status: DC

## 2022-07-13 NOTE — Telephone Encounter (Signed)
Pended/sent 

## 2022-07-13 NOTE — Telephone Encounter (Signed)
Next visit is 07/28/22. Requesting refill on Ativan and Prozac called to:  [id number] [place]  Phone: [phone] Fax: [phone]

## 2022-07-28 ENCOUNTER — Ambulatory Visit (INDEPENDENT_AMBULATORY_CARE_PROVIDER_SITE_OTHER): Payer: BC Managed Care – PPO | Admitting: Adult Health

## 2022-07-28 ENCOUNTER — Encounter: Payer: Self-pay | Admitting: Adult Health

## 2022-07-28 DIAGNOSIS — F909 Attention-deficit hyperactivity disorder, unspecified type: Secondary | ICD-10-CM

## 2022-07-28 DIAGNOSIS — F331 Major depressive disorder, recurrent, moderate: Secondary | ICD-10-CM

## 2022-07-28 DIAGNOSIS — G4700 Insomnia, unspecified: Secondary | ICD-10-CM

## 2022-07-28 DIAGNOSIS — F411 Generalized anxiety disorder: Secondary | ICD-10-CM

## 2022-07-28 DIAGNOSIS — F428 Other obsessive-compulsive disorder: Secondary | ICD-10-CM

## 2022-07-28 MED ORDER — TRAZODONE HCL 100 MG PO TABS: ORAL_TABLET | ORAL | 3 refills | Status: DC

## 2022-07-28 MED ORDER — ARIPIPRAZOLE 2 MG PO TABS: 2.0000 mg | ORAL_TABLET | Freq: Every day | ORAL | 5 refills | Status: DC

## 2022-07-28 MED ORDER — AMPHETAMINE-DEXTROAMPHETAMINE 20 MG PO TABS: 20.0000 mg | ORAL_TABLET | Freq: Two times a day (BID) | ORAL | 0 refills | Status: AC

## 2022-07-28 MED ORDER — LORAZEPAM 1 MG PO TABS: ORAL_TABLET | ORAL | 2 refills | Status: DC

## 2022-07-28 MED ORDER — FLUOXETINE HCL 40 MG PO CAPS: 40.0000 mg | ORAL_CAPSULE | Freq: Every day | ORAL | 0 refills | Status: DC

## 2022-07-28 NOTE — Progress Notes (Signed)
DANESE BERNABEI [id number] 12-29-1985 37 y.o.  Subjective:   Patient ID:  Christina [name] is a 37 y.o. (DOB [birth year]) female.  Chief Complaint: No chief complaint on file.   HPI ANNE-LOUISE FIEBIG presents to the office today for follow-up of GAD, MDD, insomnia, obsessional thoughts and ADHD.  Describes mood today as "progressing". Pleasant. Denies tearfulness. Mood symptoms - reports increased depression. Reports irritability and anxiety. Reports decreased panic attacks - "anxiety attacks". Reports worry  rumination, and over thinking. Reports obsessive thoughts, denies acts. Stating "I have been very depressed - all the sudden the weight of everything fell on me". Feels like current medications are helpful, but is willing to consider other options. Decreased interest and motivation. Taking medications as prescribed.  Energy levels lower. Active, does not have a regular exercise routine.   Enjoys some usual interests and activities. Single. Lives alone with lives with 3 children and dog. Appetite adequate. Weight gaon - 10 pounds over the past 6 months. Sleeping better with Trazadone. Averages 6 hours. Denies daytime napping. Focus and concentration stable - using Adderall as needed. Completing tasks. Managing aspects of household. Working full time Musician. Denies SI or HI.  Denies AH or VH. Denies self harm. Denies substance use.   [facility] ED from 07/04/2022 in [facility] Emergency Department at [place] No Risk        Review of Systems:  Review of Systems  Musculoskeletal:  Negative for gait problem.  Neurological:  Negative for tremors.  Psychiatric/Behavioral:         Please refer to HPI    Medications: I have reviewed the patient's current medications.  Current Outpatient Medications  Medication Sig Dispense Refill   amphetamine-dextroamphetamine (ADDERALL) 20 MG tablet Take one tablet three times daily. (Patient not  taking: Reported on 09/23/2021) 90 tablet 0   amphetamine-dextroamphetamine (ADDERALL) 20 MG tablet Take 1 tablet (20 mg total) by mouth 2 (two) times daily. 60 tablet 0   amphetamine-dextroamphetamine (ADDERALL) 20 MG tablet Take 1 tablet (20 mg total) by mouth 2 (two) times daily. 60 tablet 0   amphetamine-dextroamphetamine (ADDERALL) 20 MG tablet Take 1 tablet (20 mg total) by mouth 2 (two) times daily. 60 tablet 0   busPIRone (BUSPAR) 10 MG tablet Take 1 tablet (10 mg total) by mouth 3 (three) times daily. 90 tablet 2   FLUoxetine (PROZAC) 40 MG capsule Take 1 capsule (40 mg total) by mouth daily. 90 capsule 0   loratadine (CLARITIN) 10 MG tablet Take by mouth. (Patient not taking: Reported on 09/23/2021)     LORazepam (ATIVAN) 1 MG tablet TAKE ONE TABLET 4 TIMES A DAY AS NEEDED FOR ANXIETY. 64 tablet 0   metoprolol tartrate (LOPRESSOR) 25 MG tablet TAKE 1 TABLET BY MOUTH TWICE A DAY. MAY TAKE AN EXTRA ONE TABLET AS NEEDED FOR INCREASED HEART RATE. 225 tablet 2   Multiple Vitamin (MULTIVITAMIN WITH MINERALS) TABS tablet Take 1 tablet by mouth daily.     traZODone (DESYREL) 100 MG tablet TAKE 1 TO 2 TABLETS BY MOUTH EVERY DAY AT BEDTIME FOR SLEEP 180 tablet 3   No current facility-administered medications for this visit.    Medication Side Effects: None  Allergies:  Allergies  Allergen Reactions   Sulfa Antibiotics Hives, Itching and Swelling   Latex Itching   Polymyxin B     Other reaction(s): eye redness/pain   Polymyxin B-Trimethoprim     Other reaction(s): eye redness/pain  Past Medical History:  Diagnosis Date   Anemia    borderline anemic   Anxiety    Depression    Eczema    GERD (gastroesophageal reflux disease)    with pregnancy   History of hip fracture 10/2010   still has pain, right hip   Hyperemesis arising during pregnancy    Increased heart rate    resting heart rate 100-120   Low energy    on bedrest during pregnancy trying to regain strength   SVD  (spontaneous vaginal delivery) 12/16/2015   SVT (supraventricular tachycardia)    UTI (lower urinary tract infection)    Vaginitis and vulvovaginitis     Past Medical History, Surgical history, Social history, and Family history were reviewed and updated as appropriate.   Please see review of systems for further details on the patient's review from today.   Objective:   Physical Exam:  LMP 07/04/2022   Physical Exam Constitutional:      General: She is not in acute distress. Musculoskeletal:        General: No deformity.  Neurological:     Mental Status: She is alert and oriented to person, place, and time.     Coordination: Coordination normal.  Psychiatric:        Attention and Perception: Attention and perception normal. She does not perceive auditory or visual hallucinations.        Mood and Affect: Mood normal. Mood is not anxious or depressed. Affect is not labile, blunt, angry or inappropriate.        Speech: Speech normal.        Behavior: Behavior normal.        Thought Content: Thought content normal. Thought content is not paranoid or delusional. Thought content does not include homicidal or suicidal ideation. Thought content does not include homicidal or suicidal plan.        Cognition and Memory: Cognition and memory normal.        Judgment: Judgment normal.     Comments: Insight intact     Lab Review:     Component Value Date/Time   NA 137 04/07/2019 1834   K 3.8 04/07/2019 1834   CL 104 04/07/2019 1834   CO2 22 04/07/2019 1834   GLUCOSE 94 04/07/2019 1834   BUN 19 04/07/2019 1834   CREATININE 0.63 04/07/2019 1834   CALCIUM 9.1 04/07/2019 1834   PROT 7.1 10/04/2018 1940   ALBUMIN 4.1 10/04/2018 1940   AST 26 10/04/2018 1940   ALT 37 10/04/2018 1940   ALKPHOS 52 10/04/2018 1940   BILITOT 0.3 10/04/2018 1940   GFRNONAA >60 04/07/2019 1834   GFRAA >60 04/07/2019 1834       Component Value Date/Time   WBC 13.1 (H) 04/07/2019 1834   RBC 4.27  04/07/2019 1834   HGB 12.9 04/07/2019 1834   HCT 40.2 04/07/2019 1834   PLT 317 04/07/2019 1834   MCV 94.1 04/07/2019 1834   MCH 30.2 04/07/2019 1834   MCHC 32.1 04/07/2019 1834   RDW 12.1 04/07/2019 1834   LYMPHSABS 3.0 10/04/2018 1940   MONOABS 1.2 (H) 10/04/2018 1940   EOSABS 0.2 10/04/2018 1940   BASOSABS 0.1 10/04/2018 1940    No results found for: "POCLITH", "LITHIUM"   No results found for: "PHENYTOIN", "PHENOBARB", "VALPROATE", "CBMZ"   .res Assessment: Plan:    Plan:  Adderall 20mg  TID - taking as needed - 3 fills this year Ativan 1mg  four times daily - for anxiety and panic attacks  Trazadone 100mg  - taking 2 at hs  Prozac 40mg  daily  Add Abilify 2mg  daily  Monitor BP between visits   Reports previous seizure   Continue therapy   RTC 4 weeks  Discussed potential benefits, risk, and side effects of benzodiazepines to include potential risk of tolerance and dependence, as well as possible drowsiness.  Advised patient not to drive if experiencing drowsiness and to take lowest possible effective dose to minimize risk of dependence and tolerance.  Discussed potential benefits, risks, and side effects of stimulants with patient to include increased heart rate, palpitations, insomnia, increased anxiety, increased irritability, or decreased appetite.  Instructed patient to contact office if experiencing any significant tolerability issues. There are no diagnoses linked to this encounter.   Please see After Visit Summary for patient specific instructions.  No future appointments.  No orders of the defined types were placed in this encounter.   -------------------------------

## 2022-08-04 DIAGNOSIS — M25571 Pain in right ankle and joints of right foot: Secondary | ICD-10-CM | POA: Diagnosis not present

## 2022-08-12 DIAGNOSIS — M79671 Pain in right foot: Secondary | ICD-10-CM | POA: Diagnosis not present

## 2022-08-13 DIAGNOSIS — H1031 Unspecified acute conjunctivitis, right eye: Secondary | ICD-10-CM | POA: Diagnosis not present

## 2022-08-14 DIAGNOSIS — M79671 Pain in right foot: Secondary | ICD-10-CM | POA: Diagnosis not present

## 2022-08-20 ENCOUNTER — Other Ambulatory Visit: Payer: Self-pay | Admitting: Adult Health

## 2022-08-20 DIAGNOSIS — F331 Major depressive disorder, recurrent, moderate: Secondary | ICD-10-CM

## 2022-08-23 NOTE — Telephone Encounter (Signed)
Please call to schedule an appt, due this month.  

## 2022-08-24 NOTE — Telephone Encounter (Signed)
Called pt. LVM follow up due April.

## 2022-08-25 NOTE — Telephone Encounter (Signed)
Apt 4/24

## 2022-08-26 ENCOUNTER — Ambulatory Visit (INDEPENDENT_AMBULATORY_CARE_PROVIDER_SITE_OTHER): Payer: BC Managed Care – PPO | Admitting: Adult Health

## 2022-08-26 ENCOUNTER — Encounter: Payer: Self-pay | Admitting: Adult Health

## 2022-08-26 DIAGNOSIS — F428 Other obsessive-compulsive disorder: Secondary | ICD-10-CM | POA: Diagnosis not present

## 2022-08-26 DIAGNOSIS — G4700 Insomnia, unspecified: Secondary | ICD-10-CM

## 2022-08-26 DIAGNOSIS — F331 Major depressive disorder, recurrent, moderate: Secondary | ICD-10-CM | POA: Diagnosis not present

## 2022-08-26 DIAGNOSIS — F411 Generalized anxiety disorder: Secondary | ICD-10-CM

## 2022-08-26 MED ORDER — ARIPIPRAZOLE 5 MG PO TABS: 5.0000 mg | ORAL_TABLET | Freq: Every day | ORAL | 2 refills | Status: DC

## 2022-08-26 NOTE — Progress Notes (Signed)
Christina [name] [id number] 06/28/1985 37 y.o.  Subjective:   Patient ID:  Christina [name] is a 37 y.o. (DOB 01/21/1986) female.  Chief Complaint: No chief complaint on file.   HPI Christina [name] presents to the office today for follow-up of GAD, MDD, insomnia, obsessional thoughts and ADHD.  Describes mood today as "ok". Pleasant. Denies tearfulness. Mood symptoms - reports decreased depression. Reports irritability and anxiety - "always". Feels like the Abilify has helped with irritability. Denies panic attacks - "feels overwhelmed at times". Reports worry, rumination, and over thinking. Reports obsessive thoughts, denies acts. Stating "I feel like I'm making progress - more mentally clear". Feels like the addition of Abilify has been helpful - would like to increase dose from  to .  Improved interest and motivation. Taking medications as prescribed.  Energy levels lower. Active, does not have a regular exercise routine.   Enjoys some usual interests and activities. Single. Lives alone with lives with 3 children and dog. Appetite adequate. Weight gain over past month - 5 pounds.  Sleeps well at night. Averages 8 to 10 hours. Reports napping throughout the day. Focus and concentration stable - using Adderall as needed. Completing tasks. Managing aspects of household. Working full time - has 3 jobs.   Denies SI or HI.  Denies AH or VH. Denies self harm. Denies substance use.     Flowsheet Row ED from 07/04/2022 in [facility] Emergency Department at [facility]  C-SSRS RISK CATEGORY No Risk        Review of Systems:  Review of Systems  Musculoskeletal:  Negative for gait problem.  Neurological:  Negative for tremors.  Psychiatric/Behavioral:         Please refer to HPI    Medications: I have reviewed the patient's current medications.  Current Outpatient Medications  Medication Sig Dispense Refill   amphetamine-dextroamphetamine (ADDERALL) 20 MG tablet Take  one tablet three times daily. (Patient not taking: Reported on 09/23/2021) 90 tablet 0   amphetamine-dextroamphetamine (ADDERALL) 20 MG tablet Take 1 tablet (20 mg total) by mouth 2 (two) times daily. 60 tablet 0   amphetamine-dextroamphetamine (ADDERALL) 20 MG tablet Take 1 tablet (20 mg total) by mouth 2 (two) times daily. 60 tablet 0   [START ON 09/22/2022] amphetamine-dextroamphetamine (ADDERALL) 20 MG tablet Take 1 tablet (20 mg total) by mouth 2 (two) times daily. 60 tablet 0   ARIPiprazole (ABILIFY) 2 MG tablet Take 1 tablet (2 mg total) by mouth daily. 30 tablet 5   busPIRone (BUSPAR) 10 MG tablet Take 1 tablet (10 mg total) by mouth 3 (three) times daily. 90 tablet 2   FLUoxetine (PROZAC) 40 MG capsule Take 1 capsule (40 mg total) by mouth daily. 90 capsule 0   loratadine (CLARITIN) 10 MG tablet Take by mouth. (Patient not taking: Reported on 09/23/2021)     LORazepam (ATIVAN) 1 MG tablet TAKE ONE TABLET 3 TIMES A DAY AS NEEDED FOR ANXIETY. 90 tablet 2   metoprolol tartrate (LOPRESSOR) 25 MG tablet TAKE 1 TABLET BY MOUTH TWICE A DAY. MAY TAKE AN EXTRA ONE TABLET AS NEEDED FOR INCREASED HEART RATE. 225 tablet 2   Multiple Vitamin (MULTIVITAMIN WITH MINERALS) TABS tablet Take 1 tablet by mouth daily.     traZODone (DESYREL) 100 MG tablet TAKE 1 TO 2 TABLETS BY MOUTH EVERY DAY AT BEDTIME FOR SLEEP 180 tablet 3   No current facility-administered medications for this visit.    Medication Side Effects: None  Allergies:  Allergies  Allergen Reactions   Sulfa Antibiotics Hives, Itching and Swelling   Latex Itching   Polymyxin B     Other reaction(s): eye redness/pain   Polymyxin B-Trimethoprim     Other reaction(s): eye redness/pain    Past Medical History:  Diagnosis Date   Anemia    borderline anemic   Anxiety    Depression    Eczema    GERD (gastroesophageal reflux disease)    with pregnancy   History of hip fracture 10/2010   still has pain, right hip   Hyperemesis arising  during pregnancy    Increased heart rate    resting heart rate 100-120   Low energy    on bedrest during pregnancy trying to regain strength   SVD (spontaneous vaginal delivery) 12/16/2015   SVT (supraventricular tachycardia)    UTI (lower urinary tract infection)    Vaginitis and vulvovaginitis     Past Medical History, Surgical history, Social history, and Family history were reviewed and updated as appropriate.   Please see review of systems for further details on the patient's review from today.   Objective:   Physical Exam:  There were no vitals taken for this visit.  Physical Exam Constitutional:      General: She is not in acute distress. Musculoskeletal:        General: No deformity.  Neurological:     Mental Status: She is alert and oriented to person, place, and time.     Coordination: Coordination normal.  Psychiatric:        Attention and Perception: Attention and perception normal. She does not perceive auditory or visual hallucinations.        Mood and Affect: Mood normal. Mood is not anxious or depressed. Affect is not labile, blunt, angry or inappropriate.        Speech: Speech normal.        Behavior: Behavior normal.        Thought Content: Thought content normal. Thought content is not paranoid or delusional. Thought content does not include homicidal or suicidal ideation. Thought content does not include homicidal or suicidal plan.        Cognition and Memory: Cognition and memory normal.        Judgment: Judgment normal.     Comments: Insight intact     Lab Review:     Component Value Date/Time   NA 137 04/07/2019 1834   K 3.8 04/07/2019 1834   CL 104 04/07/2019 1834   CO2 22 04/07/2019 1834   GLUCOSE 94 04/07/2019 1834   BUN 19 04/07/2019 1834   CREATININE 0.63 04/07/2019 1834   CALCIUM 9.1 04/07/2019 1834   PROT 7.1 10/04/2018 1940   ALBUMIN 4.1 10/04/2018 1940   AST 26 10/04/2018 1940   ALT 37 10/04/2018 1940   ALKPHOS 52 10/04/2018 1940    BILITOT 0.3 10/04/2018 1940   GFRNONAA >60 04/07/2019 1834   GFRAA >60 04/07/2019 1834       Component Value Date/Time   WBC 13.1 (H) 04/07/2019 1834   RBC 4.27 04/07/2019 1834   HGB 12.9 04/07/2019 1834   HCT 40.2 04/07/2019 1834   PLT 317 04/07/2019 1834   MCV 94.1 04/07/2019 1834   MCH 30.2 04/07/2019 1834   MCHC 32.1 04/07/2019 1834   RDW 12.1 04/07/2019 1834   LYMPHSABS 3.0 10/04/2018 1940   MONOABS 1.2 (H) 10/04/2018 1940   EOSABS 0.2 10/04/2018 1940   BASOSABS 0.1 10/04/2018 1940    No  results found for: "POCLITH", "LITHIUM"   No results found for: "PHENYTOIN", "PHENOBARB", "VALPROATE", "CBMZ"   .res Assessment: Plan:    Plan:  Adderall 20mg  TID - taking as needed - has not taken since last visit. Ativan 1mg  four times daily - for anxiety and panic attacks Prozac 40mg  daily Decrease Trazadone 100mg  - taking 2 at hs  Increase Abilify 2mg  to 5mg  daily  Monitor BP between visits   Reports previous seizure   Continue therapy   RTC 4 weeks  Discussed potential benefits, risk, and side effects of benzodiazepines to include potential risk of tolerance and dependence, as well as possible drowsiness.  Advised patient not to drive if experiencing drowsiness and to take lowest possible effective dose to minimize risk of dependence and tolerance.  Discussed potential benefits, risks, and side effects of stimulants with patient to include increased heart rate, palpitations, insomnia, increased anxiety, increased irritability, or decreased appetite.  Instructed patient to contact office if experiencing any significant tolerability issues.   There are no diagnoses linked to this encounter.   Please see After Visit Summary for patient specific instructions.  Future Appointments  Date Time Provider Department Center  08/26/2022 10:20 AM Jaxyn Rout, Thereasa Solo, NP [facility] None    No orders of the defined types were placed in this  encounter.   -------------------------------

## 2022-09-20 ENCOUNTER — Other Ambulatory Visit: Payer: Self-pay

## 2022-09-20 ENCOUNTER — Emergency Department (HOSPITAL_COMMUNITY): Payer: BC Managed Care – PPO

## 2022-09-20 ENCOUNTER — Encounter (HOSPITAL_COMMUNITY): Payer: Self-pay | Admitting: Emergency Medicine

## 2022-09-20 ENCOUNTER — Emergency Department (HOSPITAL_COMMUNITY)
Admission: EM | Admit: 2022-09-20 | Discharge: 2022-09-20 | Disposition: A | Discharge status: Home / Self Care | Payer: BC Managed Care – PPO | Attending: Emergency Medicine | Admitting: Emergency Medicine

## 2022-09-20 DIAGNOSIS — E860 Dehydration: Secondary | ICD-10-CM | POA: Diagnosis not present

## 2022-09-20 DIAGNOSIS — R110 Nausea: Secondary | ICD-10-CM | POA: Diagnosis not present

## 2022-09-20 DIAGNOSIS — Z91040 Latex allergy status: Secondary | ICD-10-CM | POA: Diagnosis not present

## 2022-09-20 DIAGNOSIS — R42 Dizziness and giddiness: Secondary | ICD-10-CM | POA: Diagnosis not present

## 2022-09-20 DIAGNOSIS — R519 Headache, unspecified: Secondary | ICD-10-CM | POA: Diagnosis not present

## 2022-09-20 DIAGNOSIS — R220 Localized swelling, mass and lump, head: Secondary | ICD-10-CM | POA: Diagnosis not present

## 2022-09-20 LAB — BASIC METABOLIC PANEL
Anion gap: 11 (ref 5–15)
BUN: 8 mg/dL (ref 6–20)
CO2: 23 mmol/L (ref 22–32)
Calcium: 9.1 mg/dL (ref 8.9–10.3)
Chloride: 102 mmol/L (ref 98–111)
Creatinine, Ser: 0.63 mg/dL (ref 0.44–1.00)
GFR, Estimated: >60 mL/min (ref >60)
Glucose, Bld: 97 mg/dL (ref 70–99)
Potassium: 3.9 mmol/L (ref 3.5–5.1)
Sodium: 136 mmol/L (ref 135–145)

## 2022-09-20 LAB — CBC WITH DIFFERENTIAL/PLATELET
Abs Immature Granulocytes: 0.1 10*3/uL — ABNORMAL HIGH (ref 0.00–0.07)
Basophils Absolute: 0.1 10*3/uL (ref 0.0–0.1)
Basophils Relative: 1 %
Eosinophils Absolute: 0.2 10*3/uL (ref 0.0–0.5)
Eosinophils Relative: 3 %
HCT: 37.9 % (ref 36.0–46.0)
Hemoglobin: 12.2 g/dL (ref 12.0–15.0)
Immature Granulocytes: 1 %
Lymphocytes Relative: 24 %
Lymphs Abs: 2.1 10*3/uL (ref 0.7–4.0)
MCH: 29.6 pg (ref 26.0–34.0)
MCHC: 32.2 g/dL (ref 30.0–36.0)
MCV: 92 fL (ref 80.0–100.0)
Monocytes Absolute: 0.6 10*3/uL (ref 0.1–1.0)
Monocytes Relative: 7 %
Neutro Abs: 5.6 10*3/uL (ref 1.7–7.7)
Neutrophils Relative %: 64 %
Platelets: 347 10*3/uL (ref 150–400)
RBC: 4.12 MIL/uL (ref 3.87–5.11)
RDW: 12.7 % (ref 11.5–15.5)
WBC: 8.7 10*3/uL (ref 4.0–10.5)
nRBC: 0 % (ref 0.0–0.2)

## 2022-09-20 LAB — URINALYSIS, ROUTINE W REFLEX MICROSCOPIC
Bilirubin Urine: NEGATIVE
Glucose, UA: NEGATIVE mg/dL
Hgb urine dipstick: NEGATIVE
Ketones, ur: 5 mg/dL — AB
Leukocytes,Ua: NEGATIVE
Nitrite: NEGATIVE
Protein, ur: NEGATIVE mg/dL
Specific Gravity, Urine: 1.006 (ref 1.005–1.030)
pH: 7 (ref 5.0–8.0)

## 2022-09-20 LAB — I-STAT BETA HCG BLOOD, ED (MC, WL, AP ONLY): I-stat hCG, quantitative: <5 m[IU]/mL (ref <5)

## 2022-09-20 MED ORDER — PANTOPRAZOLE SODIUM 40 MG IV SOLR
40.0000 mg | Freq: Once | INTRAVENOUS | Status: DC
  Filled 2022-09-20: qty 10

## 2022-09-20 MED ORDER — ONDANSETRON HCL 4 MG PO TABS: 4.0000 mg | ORAL_TABLET | Freq: Three times a day (TID) | ORAL | 0 refills | Status: AC | PRN

## 2022-09-20 MED ORDER — LACTATED RINGERS IV BOLUS
1000.0000 mL | Freq: Once | INTRAVENOUS | Status: AC
  Administered 2022-09-20: 1000 mL via INTRAVENOUS

## 2022-09-20 MED ORDER — MECLIZINE HCL 25 MG PO TABS
25.0000 mg | ORAL_TABLET | Freq: Once | ORAL | Status: AC
  Administered 2022-09-20: 25 mg via ORAL
  Filled 2022-09-20: qty 1

## 2022-09-20 MED ORDER — IOHEXOL 350 MG/ML SOLN
75.0000 mL | Freq: Once | INTRAVENOUS | Status: AC | PRN
  Administered 2022-09-20: 75 mL via INTRAVENOUS

## 2022-09-20 MED ORDER — ONDANSETRON HCL 4 MG/2ML IJ SOLN
4.0000 mg | Freq: Once | INTRAMUSCULAR | Status: AC
  Administered 2022-09-20: 4 mg via INTRAVENOUS
  Filled 2022-09-20: qty 2

## 2022-09-20 MED ORDER — MECLIZINE HCL 25 MG PO TABS: 25.0000 mg | ORAL_TABLET | Freq: Three times a day (TID) | ORAL | 0 refills | Status: AC | PRN

## 2022-09-20 NOTE — Discharge Instructions (Signed)
Thank you for letting us take care of you today.  Overall, your workup today was reassuring and we did not see signs of infections or other complications related to your surgery. You appeared slightly dehydrated. I recommend increasing fluid intake with water or electrolyte replacement solutions such as Gatorade, Powerade, or Pedialyte at home.  I am also prescribing a medication called meclizine to take as needed for dizziness and a medication called Zofran to take as needed for nausea. Please follow up closely with your dental surgeon and your PCP for any continued symptoms.  For any new or worsening symptoms including chest pain, shortness of breath, worsening facial swelling, difficulty swallowing, loss of consciousness, fever, or other new concerns, please return to nearest ED for re-evaluation.

## 2022-09-20 NOTE — ED Provider Notes (Signed)
[place] [facility]MENT AT [facility] Provider Note   CSN: [id number] Arrival date & time: 09/20/22  9562     History  Chief Complaint  Patient presents with   Oral Swelling   Nausea   Emesis   Dizziness    Christina [name] is a 37 y.o. female with past medical history anxiety, depression, GERD, SVT, anemia who presents to the ED complaining of dizziness and nausea onset last night.  She is status post dental extraction on the left side 3 days ago.  Currently on ibuprofen 800 mg and amoxicillin at home postop.  She denies any complications related to the surgery but does states she is still having some pain and swelling to the left face.  States that she came here today as she has felt dizzy particularly when she moves her head and eyes to the left since last night.  She also began to have nonbloody emesis last night as well.  Able to ambulate with assistance of partner but says she has increased dizziness with doing so.  No history of the symptoms.  No headache, vision changes, focal weakness, diarrhea, fever, chest pain, shortness of breath, or other complaints today.  States that she has had adequate p.o. intake at home since her surgery.      Home Medications Prior to Admission medications   Medication Sig Start Date End Date Taking? Authorizing Provider  meclizine (ANTIVERT) 25 MG tablet Take 1 tablet (25 mg total) by mouth 3 (three) times daily as needed for up to 3 days for dizziness. 09/20/22 09/23/22 Yes Cynthie Garmon L, PA-C  ondansetron (ZOFRAN) 4 MG tablet Take 1 tablet (4 mg total) by mouth every 8 (eight) hours as needed for up to 3 days for nausea or vomiting. 09/20/22 09/23/22 Yes Nuriya Stuck L, PA-C  amphetamine-dextroamphetamine (ADDERALL) 20 MG tablet Take one tablet three times daily. Patient not taking: Reported on 09/23/2021 11/14/19   Mozingo, Thereasa Solo, NP  amphetamine-dextroamphetamine (ADDERALL) 20 MG tablet Take 1 tablet (20 mg total) by  mouth 2 (two) times daily. 07/28/22   Mozingo, Thereasa Solo, NP  amphetamine-dextroamphetamine (ADDERALL) 20 MG tablet Take 1 tablet (20 mg total) by mouth 2 (two) times daily. 08/25/22   Mozingo, Thereasa Solo, NP  amphetamine-dextroamphetamine (ADDERALL) 20 MG tablet Take 1 tablet (20 mg total) by mouth 2 (two) times daily. 09/22/22   Mozingo, Thereasa Solo, NP  ARIPiprazole (ABILIFY) 5 MG tablet Take 1 tablet (5 mg total) by mouth daily. 08/26/22   Mozingo, Thereasa Solo, NP  FLUoxetine (PROZAC) 40 MG capsule Take 1 capsule (40 mg total) by mouth daily. 07/28/22   Mozingo, Thereasa Solo, NP  loratadine (CLARITIN) 10 MG tablet Take by mouth. Patient not taking: Reported on 09/23/2021    [provider]  LORazepam (ATIVAN) 1 MG tablet TAKE ONE TABLET 3 TIMES A DAY AS NEEDED FOR ANXIETY. 07/28/22   Mozingo, Thereasa Solo, NP  metoprolol tartrate (LOPRESSOR) 25 MG tablet TAKE 1 TABLET BY MOUTH TWICE A DAY. MAY TAKE AN EXTRA ONE TABLET AS NEEDED FOR INCREASED HEART RATE. 11/25/21   Marinus Maw, MD  Multiple Vitamin (MULTIVITAMIN WITH MINERALS) TABS tablet Take 1 tablet by mouth daily.    [provider]  traZODone (DESYREL) 100 MG tablet TAKE 1 TO 2 TABLETS BY MOUTH EVERY DAY AT BEDTIME FOR SLEEP 07/28/22   Mozingo, Thereasa Solo, NP      Allergies    Sulfa antibiotics, Latex, Polymyxin b, and Polymyxin b-trimethoprim  Review of Systems   Review of Systems  All other systems reviewed and are negative.   Physical Exam Updated Vital Signs BP 110/64   Pulse 80   Temp 98.6 F (37 C) (Oral)   Resp 18   Ht 5\' 7"  (1.702 m)   Wt 108.9 kg   SpO2 99%   BMI 37.59 kg/m  Physical Exam Vitals and nursing note reviewed.  Constitutional:      General: She is not in acute distress.    Appearance: Normal appearance. She is not ill-appearing, toxic-appearing or diaphoretic.     Comments: Speaking in full sentences with ease without signs of distress  HENT:     Head:      Comments: Mild swelling over L cheek, no overlying erythema, warmth, fluctuance, or induration, non-tender to palpation    Mouth/Throat:     Comments: Patent airway, uvula midline without swelling, no significant swelling or drainage noted intra-orally, no lesions, normal phonation, no stridor Eyes:     Extraocular Movements: Extraocular movements intact.     Conjunctiva/sclera: Conjunctivae normal.     Pupils: Pupils are equal, round, and reactive to light.  Neck:     Comments: No swelling over neck Cardiovascular:     Rate and Rhythm: Normal rate and regular rhythm.     Heart sounds: No murmur heard. Pulmonary:     Effort: Pulmonary effort is normal. No respiratory distress.     Breath sounds: Normal breath sounds. No stridor. No wheezing, rhonchi or rales.  Chest:     Chest wall: No tenderness.  Abdominal:     General: Abdomen is flat. There is no distension.     Palpations: Abdomen is soft.     Tenderness: There is no abdominal tenderness. There is no guarding or rebound.  Musculoskeletal:        General: Normal range of motion.     Cervical back: Normal range of motion and neck supple.     Right lower leg: No edema.     Left lower leg: No edema.  Lymphadenopathy:     Cervical: No cervical adenopathy.  Skin:    General: Skin is warm and dry.     Capillary Refill: Capillary refill takes less than 2 seconds.     Coloration: Skin is not jaundiced or pale.     Findings: No rash.  Neurological:     General: No focal deficit present.     Mental Status: She is alert and oriented to person, place, and time.     GCS: GCS eye subscore is 4. GCS verbal subscore is 5. GCS motor subscore is 6.     Cranial Nerves: Cranial nerves 2-12 are intact. No cranial nerve deficit, dysarthria or facial asymmetry.     Sensory: Sensation is intact.     Motor: Motor function is intact. No weakness, tremor, atrophy, abnormal muscle tone or seizure activity.     Coordination: Coordination is  intact.  Psychiatric:        Mood and Affect: Mood is anxious.        Speech: Speech normal.        Behavior: Behavior is cooperative.        Judgment: Judgment normal.     ED Results / Procedures / Treatments   Labs (all labs ordered are listed, but only abnormal results are displayed) Labs Reviewed  CBC WITH DIFFERENTIAL/PLATELET - Abnormal; Notable for the following components:      Result Value   Abs  Immature Granulocytes 0.10 (*)    All other components within normal limits  URINALYSIS, ROUTINE W REFLEX MICROSCOPIC - Abnormal; Notable for the following components:   Color, Urine STRAW (*)    Ketones, ur 5 (*)    All other components within normal limits  BASIC METABOLIC PANEL  CBG MONITORING, ED  I-STAT BETA HCG BLOOD, ED ([facility], [facility], [facility] ONLY)    EKG EKG Interpretation  Date/Time:  Sunday Sep 20 2022 07:47:33 EDT Ventricular Rate:  85 PR Interval:  146 QRS Duration: 78 QT Interval:  388 QTC Calculation: 461 R Axis:   26 Text Interpretation: Normal sinus rhythm no acute ST/T changes similar to 2020 Confirmed by Pricilla Loveless [phone]) on 09/20/2022 8:05:24 AM  Radiology CT Maxillofacial W Contrast  Result Date: 09/20/2022 CLINICAL DATA:  Limited movement post dental extraction with swelling and pain to the left side of face. TMJ pain. EXAM: CT MAXILLOFACIAL WITH CONTRAST TECHNIQUE: Multidetector CT imaging of the maxillofacial structures was performed with intravenous contrast. Multiplanar CT image reconstructions were also generated. RADIATION DOSE REDUCTION: This exam was performed according to the departmental dose-optimization program which includes automated exposure control, adjustment of the mA and/or kV according to patient size and/or use of iterative reconstruction technique. CONTRAST:  75mL OMNIPAQUE IOHEXOL 350 MG/ML SOLN COMPARISON:  None Available. FINDINGS: Osseous: Left molar extraction is noted. Single prosthetic anchor is present in the left mandible anterior  to the extraction. Two prosthetic anchors are present the right mandible. No focal osseous lesions are present. The visualized upper cervical spine is within normal limits. Orbits: The globes and orbits are within normal limits. Sinuses: The paranasal sinuses and mastoid air cells are clear. Soft tissues: Asymmetric reactive right level 2a and 2b lymph nodes are present. Smaller left level 2A nodes are present. No pathologically enlarged or necrotic nodes are present. The submandibular and parotid glands and ducts are within normal limits. No focal inflammatory changes or abscess are present adjacent to the tooth extraction. Muscles of mastication are within normal limits. The TMJ is located and within normal limits bilaterally. Limited intracranial: Within normal limits. IMPRESSION: 1. Left molar extraction. 2. No focal inflammatory changes or abscess adjacent to the tooth extraction. 3. Normal appearance of the TMJ bilaterally. 4. No focal abnormality to explain trismus. 5. Asymmetric reactive right level 2a and 2b lymph nodes. Electronically Signed   By: Marin Roberts M.D.   On: 09/20/2022 11:53    Procedures Procedures    Medications Ordered in ED Medications  pantoprazole (PROTONIX) injection 40 mg (40 mg Intravenous Not Given 09/20/22 0953)  lactated ringers bolus 1,000 mL (0 mLs Intravenous Stopped 09/20/22 1209)  ondansetron (ZOFRAN) injection 4 mg (4 mg Intravenous Given 09/20/22 0953)  meclizine (ANTIVERT) tablet 25 mg (25 mg Oral Given 09/20/22 0953)  iohexol (OMNIPAQUE) 350 MG/ML injection 75 mL (75 mLs Intravenous Contrast Given 09/20/22 1141)    ED Course/ Medical Decision Making/ A&P                             Medical Decision Making Amount and/or Complexity of Data Reviewed Labs: ordered. Decision-making details documented in ED Course. Radiology: ordered. Decision-making details documented in ED Course. ECG/medicine tests: ordered. Decision-making details documented in ED  Course.  Risk Prescription drug management.   Medical Decision Making:   IRATZE CURLER is a 37 y.o. female who presented to the ED today with dizziness / nausea detailed above.  Additional history discussed with patient's family/caregivers.  Patient's presentation is complicated by their history of recent procedure, multiple comorbidities.  Patient placed on continuous vitals and telemetry monitoring while in ED which was reviewed periodically.  Complete initial physical exam performed, notably the patient was neurologically intact.  No signs respiratory distress. LCTA. Mild swelling over left cheek. No signs of intra-oral complications from pt's surgery. PERRL, EOMI.    Reviewed and confirmed nursing documentation for past medical history, family history, social history.    Initial Assessment:   With the patient's presentation, differential diagnosis includes but is not limited to BPPV, vestibular migraine, head trauma, AVM, intracranial tumor, multiple sclerosis, drug-related, CVA, vasovagal syncope, orthostatic hypotension, sepsis, hypoglycemia, electrolyte disturbance, anemia, anxiety/panic attack, medication reaction, infectious process.  This is most consistent with an acute complicated illness  Initial Plan:  Screening labs including CBC and Metabolic panel to evaluate for infectious or metabolic etiology of disease.  Urinalysis with reflex culture ordered to evaluate for UTI or relevant urologic/nephrologic pathology.  EKG to evaluate for cardiac pathology CT MXF to assess for procedural complications Symptomatic management Ambulation trial Objective evaluation as below reviewed   Initial Study Results:   Laboratory  All laboratory results reviewed without evidence of clinically relevant pathology.   Exceptions include: UA positive for ketones   EKG EKG was reviewed independently. Rate, rhythm, axis, intervals all examined and without medically relevant abnormality. ST  segments without concerns for elevations.    Radiology:  All images reviewed independently. Agree with radiology report at this time.   CT Maxillofacial W Contrast  Result Date: 09/20/2022 CLINICAL DATA:  Limited movement post dental extraction with swelling and pain to the left side of face. TMJ pain. EXAM: CT MAXILLOFACIAL WITH CONTRAST TECHNIQUE: Multidetector CT imaging of the maxillofacial structures was performed with intravenous contrast. Multiplanar CT image reconstructions were also generated. RADIATION DOSE REDUCTION: This exam was performed according to the departmental dose-optimization program which includes automated exposure control, adjustment of the mA and/or kV according to patient size and/or use of iterative reconstruction technique. CONTRAST:  75mL OMNIPAQUE IOHEXOL 350 MG/ML SOLN COMPARISON:  None Available. FINDINGS: Osseous: Left molar extraction is noted. Single prosthetic anchor is present in the left mandible anterior to the extraction. Two prosthetic anchors are present the right mandible. No focal osseous lesions are present. The visualized upper cervical spine is within normal limits. Orbits: The globes and orbits are within normal limits. Sinuses: The paranasal sinuses and mastoid air cells are clear. Soft tissues: Asymmetric reactive right level 2a and 2b lymph nodes are present. Smaller left level 2A nodes are present. No pathologically enlarged or necrotic nodes are present. The submandibular and parotid glands and ducts are within normal limits. No focal inflammatory changes or abscess are present adjacent to the tooth extraction. Muscles of mastication are within normal limits. The TMJ is located and within normal limits bilaterally. Limited intracranial: Within normal limits. IMPRESSION: 1. Left molar extraction. 2. No focal inflammatory changes or abscess adjacent to the tooth extraction. 3. Normal appearance of the TMJ bilaterally. 4. No focal abnormality to explain  trismus. 5. Asymmetric reactive right level 2a and 2b lymph nodes. Electronically Signed   By: Marin Roberts M.D.   On: 09/20/2022 11:53      Final Assessment and Plan:   37 year old female presents to the ED for evaluation of dizziness and nausea.  She is status post recent molar extraction.  Currently on amoxicillin for this.  Notes that she has increased  dizziness with looking to the left.  No history of this.  On exam, neurologically intact.  No signs of infection.  Workup initiated as above for further assessment.  Vital signs stable.  Questionable he appears mildly dry.  Symptomatic management initiated with meclizine, Zofran, and IV fluids.  Patient requested home dose of PPI while here so pantoprazole was ordered.  Following meclizine and Zofran, patient with significant improvement in symptoms.  Able to ambulate in the ED with ease and without signs of distress or significant symptoms.  Overall, blood work reassuring.  No significant electrolyte disturbance, no acute kidney injury, no leukocytosis or anemia.  UA positive for ketones but no infection.  Pregnancy negative.  EKG normal sinus rhythm without acute ST-T changes.  CT maxillofacial to assess for procedural complications essentially negative. Pt with good symptomatic control and close follow-up with dental surgeon this week.  Discussed all findings with patient and she is comfortable being discharged home.  Will continue antibiotics and other home medications.  Will prescribe meclizine and Zofran as needed for symptomatic relief and encouraged increased hydration.  Patient to closely follow-up with PCP for any continued symptoms.  Strict ED return precautions given, all questions answered, and stable for discharge.   Clinical Impression:  1. Dizziness   2. Nausea   3. Dehydration      Discharge           Final Clinical Impression(s) / ED Diagnoses Final diagnoses:  Dizziness  Nausea  Dehydration    Rx / DC  Orders ED Discharge Orders          Ordered    meclizine (ANTIVERT) 25 MG tablet  3 times daily PRN        09/20/22 1206    ondansetron (ZOFRAN) 4 MG tablet  Every 8 hours PRN        09/20/22 1206              Tonette Lederer, PA-C 09/20/22 1225    Pricilla Loveless, MD 09/20/22 1338

## 2022-09-20 NOTE — ED Triage Notes (Signed)
Pt. Stated, I had oral surgery on Thursday and Im feeling sick all over with N/V and dizziness . If I lay on my left side it makes me dizzy all over. The symptoms started last night. It helps if I close my eyes.

## 2022-09-23 DIAGNOSIS — H8110 Benign paroxysmal vertigo, unspecified ear: Secondary | ICD-10-CM | POA: Diagnosis not present

## 2022-12-15 ENCOUNTER — Other Ambulatory Visit: Payer: Self-pay | Admitting: Internal Medicine

## 2022-12-20 DIAGNOSIS — L209 Atopic dermatitis, unspecified: Secondary | ICD-10-CM | POA: Diagnosis not present

## 2022-12-20 DIAGNOSIS — R21 Rash and other nonspecific skin eruption: Secondary | ICD-10-CM | POA: Diagnosis not present

## 2023-01-06 DIAGNOSIS — J020 Streptococcal pharyngitis: Secondary | ICD-10-CM | POA: Diagnosis not present

## 2023-01-12 ENCOUNTER — Other Ambulatory Visit: Payer: Self-pay | Admitting: Internal Medicine

## 2023-01-20 DIAGNOSIS — R519 Headache, unspecified: Secondary | ICD-10-CM | POA: Diagnosis not present

## 2023-01-20 DIAGNOSIS — J069 Acute upper respiratory infection, unspecified: Secondary | ICD-10-CM | POA: Diagnosis not present

## 2023-01-20 DIAGNOSIS — Z03818 Encounter for observation for suspected exposure to other biological agents ruled out: Secondary | ICD-10-CM | POA: Diagnosis not present

## 2023-01-29 ENCOUNTER — Other Ambulatory Visit: Payer: Self-pay | Admitting: Adult Health

## 2023-01-29 DIAGNOSIS — F411 Generalized anxiety disorder: Secondary | ICD-10-CM

## 2023-01-29 DIAGNOSIS — F428 Other obsessive-compulsive disorder: Secondary | ICD-10-CM

## 2023-01-30 NOTE — Telephone Encounter (Signed)
Please call to schedule FU, past due

## 2023-02-02 ENCOUNTER — Other Ambulatory Visit: Payer: Self-pay | Admitting: Adult Health

## 2023-02-02 DIAGNOSIS — F411 Generalized anxiety disorder: Secondary | ICD-10-CM

## 2023-02-02 DIAGNOSIS — R051 Acute cough: Secondary | ICD-10-CM | POA: Diagnosis not present

## 2023-02-03 NOTE — Telephone Encounter (Signed)
Please call to schedule FU, was due in May.

## 2023-02-07 DIAGNOSIS — U071 COVID-19: Secondary | ICD-10-CM | POA: Diagnosis not present

## 2023-02-12 ENCOUNTER — Telehealth: Payer: Self-pay | Admitting: Adult Health

## 2023-02-12 ENCOUNTER — Other Ambulatory Visit: Payer: Self-pay

## 2023-02-12 DIAGNOSIS — F411 Generalized anxiety disorder: Secondary | ICD-10-CM

## 2023-02-12 MED ORDER — LORAZEPAM 1 MG PO TABS: ORAL_TABLET | ORAL | 0 refills | Status: DC

## 2023-02-12 NOTE — Telephone Encounter (Signed)
Pt called requesting a refill on her xanax. Pharmacy is [facility] on [address]. Next apt 10/15

## 2023-02-12 NOTE — Telephone Encounter (Signed)
Pended enough to appt, past due for FU.

## 2023-02-12 NOTE — Telephone Encounter (Signed)
Pt has an appt 10/15

## 2023-02-16 ENCOUNTER — Telehealth: Payer: BC Managed Care – PPO | Admitting: Adult Health

## 2023-02-16 ENCOUNTER — Encounter: Payer: Self-pay | Admitting: Adult Health

## 2023-02-16 DIAGNOSIS — F411 Generalized anxiety disorder: Secondary | ICD-10-CM | POA: Diagnosis not present

## 2023-02-16 DIAGNOSIS — F331 Major depressive disorder, recurrent, moderate: Secondary | ICD-10-CM

## 2023-02-16 DIAGNOSIS — F428 Other obsessive-compulsive disorder: Secondary | ICD-10-CM

## 2023-02-16 DIAGNOSIS — F909 Attention-deficit hyperactivity disorder, unspecified type: Secondary | ICD-10-CM | POA: Diagnosis not present

## 2023-02-16 DIAGNOSIS — G4700 Insomnia, unspecified: Secondary | ICD-10-CM | POA: Diagnosis not present

## 2023-02-16 MED ORDER — METHYLPHENIDATE HCL 20 MG PO TABS: 20.0000 mg | ORAL_TABLET | Freq: Every day | ORAL | 0 refills | Status: AC

## 2023-02-16 NOTE — Progress Notes (Signed)
Christina [name] [id number] 08-Jun-1985 37 y.o.  Virtual Visit via Video Note  I connected with pt @ on 02/16/23 at  8:40 AM EDT by a video enabled telemedicine application and verified that I am speaking with the correct person using two identifiers.   I discussed the limitations of evaluation and management by telemedicine and the availability of in person appointments. The patient expressed understanding and agreed to proceed.  I discussed the assessment and treatment plan with the patient. The patient was provided an opportunity to ask questions and all were answered. The patient agreed with the plan and demonstrated an understanding of the instructions.   The patient was advised to call back or seek an in-person evaluation if the symptoms worsen or if the condition fails to improve as anticipated.  I provided 25 minutes of non-face-to-face time during this encounter.  The patient was located at home.  The provider was located at [facility].   Dorothyann Gibbs, NP   Subjective:   Patient ID:  Christina [name] is a 37 y.o. (DOB 1986/03/05) female.  Chief Complaint: No chief complaint on file.   HPI Christina [name] presents for follow-up of GAD, MDD, insomnia, obsessional thoughts and ADHD.  Describes mood today as "ok". Pleasant. Denies tearfulness. Mood symptoms - denies depression. Reports decreased anxiety and irritability. Reports recent panic attacks - situational. Reports worry, rumination, and over thinking. Reports obsessive thoughts, denies acts. Mood is stable. Stating "I feel like I'm doing alright". Improved interest and motivation. Taking medications as prescribed.  Energy levels lower. Active, does not have a regular exercise routine. Walking dog. Enjoys some usual interests and activities. Single. Lives alone with lives with 3 children and dog. Appetite adequate. Weight gain - 20 pounds over past 6 months. Sleeps well at night. Averages 9 to 10 hours.   Focus and concentration stable - uses Adderall as needed. Completing tasks. Managing aspects of household. Working full/part time jobs. Denies SI or HI.  Denies AH or VH. Denies self harm. Denies substance use.    Review of Systems:  Review of Systems  Musculoskeletal:  Negative for gait problem.  Neurological:  Negative for tremors.  Psychiatric/Behavioral:         Please refer to HPI    Medications: I have reviewed the patient's current medications.  Current Outpatient Medications  Medication Sig Dispense Refill   methylphenidate (RITALIN) 20 MG tablet Take 1 tablet (20 mg total) by mouth daily. 30 tablet 0   amphetamine-dextroamphetamine (ADDERALL) 20 MG tablet Take one tablet three times daily. (Patient not taking: Reported on 09/23/2021) 90 tablet 0   amphetamine-dextroamphetamine (ADDERALL) 20 MG tablet Take 1 tablet (20 mg total) by mouth 2 (two) times daily. 60 tablet 0   amphetamine-dextroamphetamine (ADDERALL) 20 MG tablet Take 1 tablet (20 mg total) by mouth 2 (two) times daily. 60 tablet 0   amphetamine-dextroamphetamine (ADDERALL) 20 MG tablet Take 1 tablet (20 mg total) by mouth 2 (two) times daily. 60 tablet 0   FLUoxetine (PROZAC) 40 MG capsule TAKE 1 CAPSULE (40 MG TOTAL) BY MOUTH DAILY. 30 capsule 0   loratadine (CLARITIN) 10 MG tablet Take by mouth. (Patient not taking: Reported on 09/23/2021)     LORazepam (ATIVAN) 1 MG tablet TAKE ONE TABLET 3 TIMES A DAY AS NEEDED FOR ANXIETY. 15 tablet 0   metoprolol tartrate (LOPRESSOR) 25 MG tablet TAKE 1 TABLET BY MOUTH TWICE A DAY. MAY TAKE AN EXTRA ONE TABLET AS NEEDED FOR INCREASED HEART  RATE. 60 tablet 0   Multiple Vitamin (MULTIVITAMIN WITH MINERALS) TABS tablet Take 1 tablet by mouth daily.     traZODone (DESYREL) 100 MG tablet TAKE 1 TO 2 TABLETS BY MOUTH EVERY DAY AT BEDTIME FOR SLEEP 180 tablet 3   No current facility-administered medications for this visit.    Medication Side Effects: None  Allergies:   Allergies  Allergen Reactions   Sulfa Antibiotics Hives, Itching and Swelling   Latex Itching   Polymyxin B     Other reaction(s): eye redness/pain   Polymyxin B-Trimethoprim     Other reaction(s): eye redness/pain    Past Medical History:  Diagnosis Date   Anemia    borderline anemic   Anxiety    Anxiety    Depression    Eczema    GERD (gastroesophageal reflux disease)    with pregnancy   History of hip fracture 10/2010   still has pain, right hip   Hyperemesis arising during pregnancy    Increased heart rate    resting heart rate 100-120   Low energy    on bedrest during pregnancy trying to regain strength   SVD (spontaneous vaginal delivery) 12/16/2015   SVT (supraventricular tachycardia) (HCC)    SVT (supraventricular tachycardia) (HCC)    UTI (lower urinary tract infection)    Vaginitis and vulvovaginitis     Family History  Problem Relation Age of Onset   Alcohol abuse Mother    Anxiety disorder Mother    Asthma Mother    Mental illness Mother        bipolar   Depression Mother    Hypothyroidism Mother    Ovarian cysts Mother    Hypertension Father    Endometriosis Sister    Ovarian cysts Sister    Alcohol abuse Brother    Crohn's disease Brother    Alcohol abuse Maternal Aunt    Cancer Maternal Aunt        breast   Hypothyroidism Maternal Aunt    Miscarriages / Stillbirths Maternal Aunt        stillbirth   Alcohol abuse Maternal Uncle    Asthma Maternal Grandmother    Mental illness Maternal Grandmother        bipolar   Asthma Maternal Grandfather    Diabetes Maternal Grandfather    Hypertension Maternal Grandfather    Asthma Paternal Grandmother    Hypertension Paternal Grandmother    Asthma Paternal Grandfather    Diabetes Paternal Grandfather    Heart disease Paternal Grandfather    Hypertension Paternal Grandfather     Social History   Socioeconomic History   Marital status: Married    Spouse name: Not on file   Number of  children: Not on file   Years of education: Not on file   Highest education level: Not on file  Occupational History   Not on file  Tobacco Use   Smoking status: Never   Smokeless tobacco: Never  Vaping Use   Vaping status: Never Used  Substance and Sexual Activity   Alcohol use: Yes    Comment: occ   Drug use: No   Sexual activity: Yes    Birth control/protection: None  Other Topics Concern   Not on file  Social History Narrative   Not on file   Social Determinants of Health   Financial Resource Strain: Not on file  Food Insecurity: Not on file  Transportation Needs: Not on file  Physical Activity: Not on file  Stress: Not  on file  Social Connections: Unknown (09/06/2021)   Received from [facility]   Social Network    Social Network: Not on file  Intimate Partner Violence: Unknown (08/04/2021)   Received from [facility]   HITS    Physically Hurt: Not on file    Insult or Talk Down To: Not on file    Threaten Physical Harm: Not on file    Scream or Curse: Not on file    Past Medical History, Surgical history, Social history, and Family history were reviewed and updated as appropriate.   Please see review of systems for further details on the patient's review from today.   Objective:   Physical Exam:  There were no vitals taken for this visit.  Physical Exam Constitutional:      General: She is not in acute distress. Musculoskeletal:        General: No deformity.  Neurological:     Mental Status: She is alert and oriented to person, place, and time.     Coordination: Coordination normal.  Psychiatric:        Attention and Perception: Attention and perception normal. She does not perceive auditory or visual hallucinations.        Mood and Affect: Mood normal. Mood is not anxious or depressed. Affect is not labile, blunt, angry or inappropriate.        Speech: Speech normal.        Behavior: Behavior normal.        Thought  Content: Thought content normal. Thought content is not paranoid or delusional. Thought content does not include homicidal or suicidal ideation. Thought content does not include homicidal or suicidal plan.        Cognition and Memory: Cognition and memory normal.        Judgment: Judgment normal.     Comments: Insight intact     Lab Review:     Component Value Date/Time   NA 136 09/20/2022 0747   K 3.9 09/20/2022 0747   CL 102 09/20/2022 0747   CO2 23 09/20/2022 0747   GLUCOSE 97 09/20/2022 0747   BUN 8 09/20/2022 0747   CREATININE 0.63 09/20/2022 0747   CALCIUM 9.1 09/20/2022 0747   PROT 7.1 10/04/2018 1940   ALBUMIN 4.1 10/04/2018 1940   AST 26 10/04/2018 1940   ALT 37 10/04/2018 1940   ALKPHOS 52 10/04/2018 1940   BILITOT 0.3 10/04/2018 1940   GFRNONAA >60 09/20/2022 0747   GFRAA >60 04/07/2019 1834       Component Value Date/Time   WBC 8.7 09/20/2022 0747   RBC 4.12 09/20/2022 0747   HGB 12.2 09/20/2022 0747   HCT 37.9 09/20/2022 0747   PLT 347 09/20/2022 0747   MCV 92.0 09/20/2022 0747   MCH 29.6 09/20/2022 0747   MCHC 32.2 09/20/2022 0747   RDW 12.7 09/20/2022 0747   LYMPHSABS 2.1 09/20/2022 0747   MONOABS 0.6 09/20/2022 0747   EOSABS 0.2 09/20/2022 0747   BASOSABS 0.1 09/20/2022 0747    No results found for: "POCLITH", "LITHIUM"   No results found for: "PHENYTOIN", "PHENOBARB", "VALPROATE", "CBMZ"   .res Assessment: Plan:    Plan:  D/C Adderall 20mg  TID - taking 1/2 tablet twice Add Ritalin 20mg  - 1/2 tab BID  Prozac 40mg  daily  Ativan 1mg  three times daily - for anxiety and panic attacks Trazadone 100mg  - taking 1 at hs   Monitor BP between visits   Reports previous seizure  Continue therapy   RTC 4 weeks  Discussed potential benefits, risk, and side effects of benzodiazepines to include potential risk of tolerance and dependence, as well as possible drowsiness.  Advised patient not to drive if experiencing drowsiness and to take lowest  possible effective dose to minimize risk of dependence and tolerance.  Discussed potential benefits, risks, and side effects of stimulants with patient to include increased heart rate, palpitations, insomnia, increased anxiety, increased irritability, or decreased appetite.  Instructed patient to contact office if experiencing any significant tolerability issues. Diagnoses and all orders for this visit:  Major depressive disorder, recurrent episode, moderate (HCC)  Attention deficit hyperactivity disorder (ADHD), unspecified ADHD type -     methylphenidate (RITALIN) 20 MG tablet; Take 1 tablet (20 mg total) by mouth daily.  Generalized anxiety disorder  Insomnia, unspecified type  Obsessional thoughts     Please see After Visit Summary for patient specific instructions.  No future appointments.   No orders of the defined types were placed in this encounter.     -------------------------------

## 2023-02-26 DIAGNOSIS — R600 Localized edema: Secondary | ICD-10-CM | POA: Diagnosis not present

## 2023-02-28 ENCOUNTER — Other Ambulatory Visit: Payer: Self-pay | Admitting: Adult Health

## 2023-02-28 DIAGNOSIS — F411 Generalized anxiety disorder: Secondary | ICD-10-CM

## 2023-02-28 NOTE — Telephone Encounter (Signed)
Please call to schedule F/U.  ?

## 2023-03-01 ENCOUNTER — Emergency Department (HOSPITAL_BASED_OUTPATIENT_CLINIC_OR_DEPARTMENT_OTHER): Payer: BC Managed Care – PPO

## 2023-03-01 ENCOUNTER — Emergency Department (HOSPITAL_BASED_OUTPATIENT_CLINIC_OR_DEPARTMENT_OTHER)
Admission: EM | Admit: 2023-03-01 | Discharge: 2023-03-02 | Disposition: A | Discharge status: Home / Self Care | Payer: BC Managed Care – PPO | Attending: Emergency Medicine | Admitting: Emergency Medicine

## 2023-03-01 ENCOUNTER — Encounter (HOSPITAL_BASED_OUTPATIENT_CLINIC_OR_DEPARTMENT_OTHER): Payer: Self-pay

## 2023-03-01 ENCOUNTER — Other Ambulatory Visit: Payer: Self-pay

## 2023-03-01 DIAGNOSIS — R109 Unspecified abdominal pain: Secondary | ICD-10-CM | POA: Diagnosis not present

## 2023-03-01 DIAGNOSIS — R160 Hepatomegaly, not elsewhere classified: Secondary | ICD-10-CM | POA: Diagnosis not present

## 2023-03-01 DIAGNOSIS — R053 Chronic cough: Secondary | ICD-10-CM | POA: Diagnosis not present

## 2023-03-01 DIAGNOSIS — R9389 Abnormal findings on diagnostic imaging of other specified body structures: Secondary | ICD-10-CM | POA: Diagnosis not present

## 2023-03-01 DIAGNOSIS — R1031 Right lower quadrant pain: Secondary | ICD-10-CM | POA: Diagnosis not present

## 2023-03-01 DIAGNOSIS — Z91040 Latex allergy status: Secondary | ICD-10-CM | POA: Diagnosis not present

## 2023-03-01 DIAGNOSIS — K760 Fatty (change of) liver, not elsewhere classified: Secondary | ICD-10-CM

## 2023-03-01 DIAGNOSIS — L309 Dermatitis, unspecified: Secondary | ICD-10-CM | POA: Diagnosis not present

## 2023-03-01 DIAGNOSIS — N83202 Unspecified ovarian cyst, left side: Secondary | ICD-10-CM

## 2023-03-01 DIAGNOSIS — R1011 Right upper quadrant pain: Secondary | ICD-10-CM

## 2023-03-01 DIAGNOSIS — K5900 Constipation, unspecified: Secondary | ICD-10-CM | POA: Diagnosis not present

## 2023-03-01 DIAGNOSIS — N83201 Unspecified ovarian cyst, right side: Secondary | ICD-10-CM | POA: Diagnosis not present

## 2023-03-01 DIAGNOSIS — R399 Unspecified symptoms and signs involving the genitourinary system: Secondary | ICD-10-CM | POA: Diagnosis not present

## 2023-03-01 LAB — COMPREHENSIVE METABOLIC PANEL
ALT: 36 U/L (ref 0–44)
AST: 23 U/L (ref 15–41)
Albumin: 4.9 g/dL (ref 3.5–5.0)
Alkaline Phosphatase: 53 U/L (ref 38–126)
Anion gap: 9 (ref 5–15)
BUN: 18 mg/dL (ref 6–20)
CO2: 24 mmol/L (ref 22–32)
Calcium: 10 mg/dL (ref 8.9–10.3)
Chloride: 103 mmol/L (ref 98–111)
Creatinine, Ser: 0.74 mg/dL (ref 0.44–1.00)
GFR, Estimated: >60 mL/min (ref >60)
Glucose, Bld: 118 mg/dL — ABNORMAL HIGH (ref 70–99)
Potassium: 3.8 mmol/L (ref 3.5–5.1)
Sodium: 136 mmol/L (ref 135–145)
Total Bilirubin: 0.3 mg/dL (ref 0.3–1.2)
Total Protein: 7.8 g/dL (ref 6.5–8.1)

## 2023-03-01 LAB — URINALYSIS, ROUTINE W REFLEX MICROSCOPIC
Bilirubin Urine: NEGATIVE
Glucose, UA: NEGATIVE mg/dL
Ketones, ur: NEGATIVE mg/dL
Leukocytes,Ua: NEGATIVE
Nitrite: NEGATIVE
Specific Gravity, Urine: 1.031 — ABNORMAL HIGH (ref 1.005–1.030)
pH: 5.5 (ref 5.0–8.0)

## 2023-03-01 LAB — CBC
HCT: 40.4 % (ref 36.0–46.0)
Hemoglobin: 13.2 g/dL (ref 12.0–15.0)
MCH: 29.5 pg (ref 26.0–34.0)
MCHC: 32.7 g/dL (ref 30.0–36.0)
MCV: 90.4 fL (ref 80.0–100.0)
Platelets: 319 10*3/uL (ref 150–400)
RBC: 4.47 MIL/uL (ref 3.87–5.11)
RDW: 12.8 % (ref 11.5–15.5)
WBC: 13 10*3/uL — ABNORMAL HIGH (ref 4.0–10.5)
nRBC: 0 % (ref 0.0–0.2)

## 2023-03-01 LAB — LIPASE, BLOOD: Lipase: 25 U/L (ref 11–51)

## 2023-03-01 LAB — PREGNANCY, URINE: Preg Test, Ur: NEGATIVE

## 2023-03-01 MED ORDER — ACETAMINOPHEN 500 MG PO TABS
1000.0000 mg | ORAL_TABLET | Freq: Once | ORAL | Status: AC
  Administered 2023-03-01: 1000 mg via ORAL
  Filled 2023-03-01: qty 2

## 2023-03-01 MED ORDER — IOHEXOL 300 MG/ML  SOLN
100.0000 mL | Freq: Once | INTRAMUSCULAR | Status: AC | PRN
  Administered 2023-03-01: 100 mL via INTRAVENOUS

## 2023-03-01 NOTE — ED Provider Notes (Cosign Needed)
[place] EMERGENCY DEPARTMENT AT [facility] Provider Note   CSN: [id number] Arrival date & time: 03/01/23  1923     History  Chief Complaint  Patient presents with   Abdominal Pain    Christina [name] is a 37 y.o. female.  The history is provided by the patient and medical records. No language interpreter was used.  Abdominal Pain     37 year old female significant history of ovarian cyst, GERD, anxiety, recurrent UTI sent here from PCP office for evaluation of abdominal pain.  Patient endorse for the past 4 weeks she has had recurrent pain to her right side abdomen which radiates towards her back.  Pain can become increasingly more intolerable.  She also endorsed having some nausea but no vomiting or diarrhea.  She denies having any fever or urinary discomfort.  Eating does not affect her pain.  She denies any specific trauma.  She mention her pain has increased.  She tries to use heating pad and rest and distract herself with some improvement of her symptoms.  She has had prior cholecystectomy.  She still has intact appendix.  She denies any vaginal bleeding or vaginal discharge.  She has had ovarian cyst removed in the past.  She did not have any pain at that time.  No history of kidney stone.  Home Medications Prior to Admission medications   Medication Sig Start Date End Date Taking? Authorizing Provider  amphetamine-dextroamphetamine (ADDERALL) 20 MG tablet Take one tablet three times daily. Patient not taking: Reported on 09/23/2021 11/14/19   Mozingo, Thereasa Solo, NP  amphetamine-dextroamphetamine (ADDERALL) 20 MG tablet Take 1 tablet (20 mg total) by mouth 2 (two) times daily. 07/28/22   Mozingo, Thereasa Solo, NP  amphetamine-dextroamphetamine (ADDERALL) 20 MG tablet Take 1 tablet (20 mg total) by mouth 2 (two) times daily. 08/25/22   Mozingo, Thereasa Solo, NP  amphetamine-dextroamphetamine (ADDERALL) 20 MG tablet Take 1 tablet (20 mg total) by mouth 2  (two) times daily. 09/22/22   Mozingo, Thereasa Solo, NP  FLUoxetine (PROZAC) 40 MG capsule TAKE 1 CAPSULE (40 MG TOTAL) BY MOUTH DAILY. 01/30/23   Mozingo, Thereasa Solo, NP  loratadine (CLARITIN) 10 MG tablet Take by mouth. Patient not taking: Reported on 09/23/2021    [provider]  LORazepam (ATIVAN) 1 MG tablet TAKE ONE TABLET 3 TIMES A DAY AS NEEDED FOR ANXIETY. 02/12/23   Mozingo, Thereasa Solo, NP  methylphenidate (RITALIN) 20 MG tablet Take 1 tablet (20 mg total) by mouth daily. 02/16/23   Mozingo, Thereasa Solo, NP  metoprolol tartrate (LOPRESSOR) 25 MG tablet TAKE 1 TABLET BY MOUTH TWICE A DAY. MAY TAKE AN EXTRA ONE TABLET AS NEEDED FOR INCREASED HEART RATE. 12/15/22   Marinus Maw, MD  Multiple Vitamin (MULTIVITAMIN WITH MINERALS) TABS tablet Take 1 tablet by mouth daily.    [provider]  traZODone (DESYREL) 100 MG tablet TAKE 1 TO 2 TABLETS BY MOUTH EVERY DAY AT BEDTIME FOR SLEEP 07/28/22   Mozingo, Thereasa Solo, NP      Allergies    Sulfa antibiotics, Latex, Polymyxin b, and Polymyxin b-trimethoprim    Review of Systems   Review of Systems  Gastrointestinal:  Positive for abdominal pain.  All other systems reviewed and are negative.   Physical Exam Updated Vital Signs BP 102/66 (BP Location: Right Arm)   Pulse 99   Temp 97.9 F (36.6 C) (Oral)   Resp 18   Ht 5\' 7"  (1.702 m)   Wt 112.5 kg  LMP 01/29/2023   SpO2 99%   BMI 38.84 kg/m  Physical Exam Vitals and nursing note reviewed.  Constitutional:      General: She is not in acute distress.    Appearance: She is well-developed.  HENT:     Head: Atraumatic.  Eyes:     Conjunctiva/sclera: Conjunctivae normal.  Pulmonary:     Effort: Pulmonary effort is normal.  Musculoskeletal:     Cervical back: Neck supple.  Skin:    Findings: No rash.  Neurological:     Mental Status: She is alert.  Psychiatric:        Mood and Affect: Mood normal.     ED Results / Procedures /  Treatments   Labs (all labs ordered are listed, but only abnormal results are displayed) Labs Reviewed  COMPREHENSIVE METABOLIC PANEL - Abnormal; Notable for the following components:      Result Value   Glucose, Bld 118 (*)    All other components within normal limits  CBC - Abnormal; Notable for the following components:   WBC 13.0 (*)    All other components within normal limits  URINALYSIS, ROUTINE W REFLEX MICROSCOPIC - Abnormal; Notable for the following components:   Specific Gravity, Urine 1.031 (*)    Hgb urine dipstick SMALL (*)    Protein, ur TRACE (*)    Bacteria, UA RARE (*)    All other components within normal limits  LIPASE, BLOOD  PREGNANCY, URINE    EKG None  Radiology No results found.  Procedures Procedures    Medications Ordered in ED Medications - No data to display  ED Course/ Medical Decision Making/ A&P                                 Medical Decision Making Amount and/or Complexity of Data Reviewed Labs: ordered. Radiology: ordered.  Risk OTC drugs. Prescription drug management.   BP 102/66 (BP Location: Right Arm)   Pulse 99   Temp 97.9 F (36.6 C) (Oral)   Resp 18   Ht 5\' 7"  (1.702 m)   Wt 112.5 kg   LMP 01/29/2023   SpO2 99%   BMI 38.84 kg/m   40:11 PM 37 year old female significant history of ovarian cyst, GERD, anxiety, recurrent UTI sent here from PCP office for evaluation of abdominal pain.  Patient endorse for the past 4 weeks she has had recurrent pain to her right side abdomen which radiates towards her back.  Pain can become increasingly more intolerable.  She also endorsed having some nausea but no vomiting or diarrhea.  She denies having any fever or urinary discomfort.  Eating does not affect her pain.  She denies any specific trauma.  She mention her pain has increased.  She tries to use heating pad and rest and distract herself with some improvement of her symptoms.  She has had prior cholecystectomy.  She still  has intact appendix.  She denies any vaginal bleeding or vaginal discharge.  She has had ovarian cyst removed in the past.  She did not have any pain at that time.  No history of kidney stone.  Exam remarkable for tenderness noted to right side abdomen no guarding no rebound tenderness no overlying skin changes and no CVA tenderness.  Given her recurrent discomfort, and labs remarkable for an elevated white count of 13.0, will obtain abdominal pelvis CT scan for further assessment.  Patient given Tylenol for pain.  -  Labs ordered, independently viewed and interpreted by me.  Labs remarkable for WBC 13, UA with trace of blood,  -The patient was maintained on a cardiac monitor.  I personally viewed and interpreted the cardiac monitored which showed an underlying rhythm of: NSR -Imaging including abd/pelvis CT which is currently pending -This patient presents to the ED for concern of abd pain, this involves an extensive number of treatment options, and is a complaint that carries with it a high risk of complications and morbidity.  The differential diagnosis includes appendicitis, colitis, diverticulitis, pyelo, kidney stone -Co morbidities that complicate the patient evaluation includes ADHD, GAD, GERD -Treatment includes tylenol -Reevaluation of the patient after these medicines showed that the patient improved -PCP office notes or outside notes reviewed -Discussion with attending Dr. Bernette Mayers who will f/u on CT and will determine disposition -Escalation to admission/observation considered: dispo pending         Final Clinical Impression(s) / ED Diagnoses Final diagnoses:  None    Rx / DC Orders ED Discharge Orders     None         Fayrene Helper, PA-C 03/01/23 2359

## 2023-03-01 NOTE — ED Triage Notes (Signed)
Complaining of right flank pain that started 4 weeks ago and has been worsing since.  Today it has been unbearable. She went to her pcp and they recommended her come to the er for a CT.

## 2023-03-01 NOTE — Telephone Encounter (Signed)
Pt is scheduled for 11/15

## 2023-03-02 DIAGNOSIS — K769 Liver disease, unspecified: Secondary | ICD-10-CM | POA: Diagnosis not present

## 2023-03-02 DIAGNOSIS — K5900 Constipation, unspecified: Secondary | ICD-10-CM | POA: Diagnosis not present

## 2023-03-02 DIAGNOSIS — K760 Fatty (change of) liver, not elsewhere classified: Secondary | ICD-10-CM | POA: Diagnosis not present

## 2023-03-02 DIAGNOSIS — N838 Other noninflammatory disorders of ovary, fallopian tube and broad ligament: Secondary | ICD-10-CM | POA: Diagnosis not present

## 2023-03-02 NOTE — ED Provider Notes (Signed)
Care assumed at shift change. Here for several weeks of R sided abdominal pain. Labs reassuring, awaiting CT.  Physical Exam  BP 97/70 (BP Location: Left Arm)   Pulse 90   Temp 98.1 F (36.7 C) (Oral)   Resp 19   Ht 5\' 7"  (1.702 m)   Wt 112.5 kg   LMP 01/29/2023   SpO2 96%   BMI 38.84 kg/m   Physical Exam  Procedures  Procedures  ED Course / MDM   Clinical Course as of 03/02/23 0254  Tue Mar 02, 2023  0252 I personally viewed the images from radiology studies and agree with radiologist interpretation: CT shows a left sided ovarian cyst, not likely to be contributing to her pain. There is hepatomegaly and steatosis which may be the cause. She will follow up with her Gyn for the ovarian cyst and I will refer to GI for her hepatic steatosis. RTED for any other concerns.   [CS]    Clinical Course User Index [CS] [name], MD   Medical Decision Making Amount and/or Complexity of Data Reviewed Labs: ordered. Radiology: ordered.  Risk OTC drugs. Prescription drug management.          [name], MD 03/02/23 [phone]

## 2023-03-03 DIAGNOSIS — K769 Liver disease, unspecified: Secondary | ICD-10-CM | POA: Diagnosis not present

## 2023-03-03 DIAGNOSIS — R1011 Right upper quadrant pain: Secondary | ICD-10-CM | POA: Diagnosis not present

## 2023-03-03 DIAGNOSIS — Z9049 Acquired absence of other specified parts of digestive tract: Secondary | ICD-10-CM | POA: Diagnosis not present

## 2023-03-04 ENCOUNTER — Other Ambulatory Visit: Payer: Self-pay | Admitting: Gastroenterology

## 2023-03-04 DIAGNOSIS — K769 Liver disease, unspecified: Secondary | ICD-10-CM

## 2023-03-07 ENCOUNTER — Other Ambulatory Visit: Payer: Self-pay | Admitting: Adult Health

## 2023-03-07 DIAGNOSIS — F411 Generalized anxiety disorder: Secondary | ICD-10-CM

## 2023-03-17 ENCOUNTER — Ambulatory Visit: Payer: BC Managed Care – PPO | Admitting: Adult Health

## 2023-03-17 ENCOUNTER — Encounter: Payer: Self-pay | Admitting: Adult Health

## 2023-03-17 DIAGNOSIS — F331 Major depressive disorder, recurrent, moderate: Secondary | ICD-10-CM

## 2023-03-17 DIAGNOSIS — F428 Other obsessive-compulsive disorder: Secondary | ICD-10-CM

## 2023-03-17 DIAGNOSIS — F909 Attention-deficit hyperactivity disorder, unspecified type: Secondary | ICD-10-CM

## 2023-03-17 DIAGNOSIS — F411 Generalized anxiety disorder: Secondary | ICD-10-CM

## 2023-03-17 MED ORDER — ALPRAZOLAM 1 MG PO TABS
1.0000 mg | ORAL_TABLET | Freq: Two times a day (BID) | ORAL | 2 refills | Status: DC
Start: 2023-03-17 — End: 2023-08-09

## 2023-03-17 MED ORDER — FLUOXETINE HCL 40 MG PO CAPS
40.0000 mg | ORAL_CAPSULE | Freq: Every day | ORAL | 5 refills | Status: DC
Start: 2023-03-17 — End: 2023-08-09

## 2023-03-17 NOTE — Progress Notes (Signed)
Christina [name] [id number] [birth year] 37 y.o.  Subjective:   Patient ID:  Christina [name] is a 37 y.o. (DOB Jan 20, 1986) female.  Chief Complaint: No chief complaint on file.   HPI Christina [name] presents to the office today for follow-up of GAD, MDD, insomnia, obsessional thoughts and ADHD.  Describes mood today as "ok". Pleasant. Reports tearfulness. Mood symptoms - denies depression. Reports increased anxiety. Reports irritability. Reports recent panic attacks - 5 out of the last 7 days. Reports worry, rumination, and over thinking. Reports obsessive thoughts, denies acts. Mood is lower. Stating "I feel really worried right now". Improved interest and motivation. Taking medications as prescribed.  Energy levels improved. Active, does not have a regular exercise routine - walking dog. Enjoys some usual interests and activities. Single. Lives alone with lives with 3 children and dog. Appetite adequate. Weight loss - 238 pounds. Sleeps well at night. Averages 7 hours.  Focus and concentration difficulties. Completing tasks. Managing aspects of household. Working full and part time job. Denies SI or HI.  Denies AH or VH. Denies self harm. Denies substance use.   Flowsheet Row ED from 03/01/2023 in [facility] Emergency Department at [facility] ED from 09/20/2022 in [facility] Emergency Department at [facility] ED from 07/04/2022 in [facility] Emergency Department at [facility]  C-SSRS RISK CATEGORY No Risk No Risk No Risk        Review of Systems:  Review of Systems  Musculoskeletal:  Negative for gait problem.  Neurological:  Negative for tremors.  Psychiatric/Behavioral:         Please refer to HPI    Medications: I have reviewed the patient's current medications.  Current Outpatient Medications  Medication Sig Dispense Refill   amphetamine-dextroamphetamine (ADDERALL) 20 MG tablet Take one tablet three times daily. (Patient not taking: Reported on  09/23/2021) 90 tablet 0   amphetamine-dextroamphetamine (ADDERALL) 20 MG tablet Take 1 tablet (20 mg total) by mouth 2 (two) times daily. 60 tablet 0   amphetamine-dextroamphetamine (ADDERALL) 20 MG tablet Take 1 tablet (20 mg total) by mouth 2 (two) times daily. 60 tablet 0   amphetamine-dextroamphetamine (ADDERALL) 20 MG tablet Take 1 tablet (20 mg total) by mouth 2 (two) times daily. 60 tablet 0   FLUoxetine (PROZAC) 40 MG capsule TAKE 1 CAPSULE (40 MG TOTAL) BY MOUTH DAILY. 30 capsule 0   loratadine (CLARITIN) 10 MG tablet Take by mouth. (Patient not taking: Reported on 09/23/2021)     LORazepam (ATIVAN) 1 MG tablet TAKE 1 TABLET 3 TIMES A DAY AS NEEDED FOR ANXIETY 90 tablet 0   methylphenidate (RITALIN) 20 MG tablet Take 1 tablet (20 mg total) by mouth daily. 30 tablet 0   metoprolol tartrate (LOPRESSOR) 25 MG tablet TAKE 1 TABLET BY MOUTH TWICE A DAY. MAY TAKE AN EXTRA ONE TABLET AS NEEDED FOR INCREASED HEART RATE. 60 tablet 0   Multiple Vitamin (MULTIVITAMIN WITH MINERALS) TABS tablet Take 1 tablet by mouth daily.     traZODone (DESYREL) 100 MG tablet TAKE 1 TO 2 TABLETS BY MOUTH EVERY DAY AT BEDTIME FOR SLEEP 180 tablet 3   No current facility-administered medications for this visit.    Medication Side Effects: None  Allergies:  Allergies  Allergen Reactions   Sulfa Antibiotics Hives, Itching and Swelling   Latex Itching   Polymyxin B     Other reaction(s): eye redness/pain   Polymyxin B-Trimethoprim     Other reaction(s): eye redness/pain    Past  Medical History:  Diagnosis Date   Anemia    borderline anemic   Anxiety    Anxiety    Depression    Eczema    GERD (gastroesophageal reflux disease)    with pregnancy   History of hip fracture 10/2010   still has pain, right hip   Hyperemesis arising during pregnancy    Increased heart rate    resting heart rate 100-120   Low energy    on bedrest during pregnancy trying to regain strength   SVD (spontaneous vaginal  delivery) 12/16/2015   SVT (supraventricular tachycardia) (HCC)    SVT (supraventricular tachycardia) (HCC)    UTI (lower urinary tract infection)    Vaginitis and vulvovaginitis     Past Medical History, Surgical history, Social history, and Family history were reviewed and updated as appropriate.   Please see review of systems for further details on the patient's review from today.   Objective:   Physical Exam:  LMP 01/29/2023   Physical Exam Constitutional:      General: She is not in acute distress. Musculoskeletal:        General: No deformity.  Neurological:     Mental Status: She is alert and oriented to person, place, and time.     Coordination: Coordination normal.  Psychiatric:        Attention and Perception: Attention and perception normal. She does not perceive auditory or visual hallucinations.        Mood and Affect: Affect is not labile, blunt, angry or inappropriate.        Speech: Speech normal.        Behavior: Behavior normal.        Thought Content: Thought content normal. Thought content is not paranoid or delusional. Thought content does not include homicidal or suicidal ideation. Thought content does not include homicidal or suicidal plan.        Cognition and Memory: Cognition and memory normal.        Judgment: Judgment normal.     Comments: Insight intact     Lab Review:     Component Value Date/Time   NA 136 03/01/2023 1940   K 3.8 03/01/2023 1940   CL 103 03/01/2023 1940   CO2 24 03/01/2023 1940   GLUCOSE 118 (H) 03/01/2023 1940   BUN 18 03/01/2023 1940   CREATININE 0.74 03/01/2023 1940   CALCIUM 10.0 03/01/2023 1940   PROT 7.8 03/01/2023 1940   ALBUMIN 4.9 03/01/2023 1940   AST 23 03/01/2023 1940   ALT 36 03/01/2023 1940   ALKPHOS 53 03/01/2023 1940   BILITOT 0.3 03/01/2023 1940   GFRNONAA >60 03/01/2023 1940   GFRAA >60 04/07/2019 1834       Component Value Date/Time   WBC 13.0 (H) 03/01/2023 1940   RBC 4.47 03/01/2023 1940    HGB 13.2 03/01/2023 1940   HCT 40.4 03/01/2023 1940   PLT 319 03/01/2023 1940   MCV 90.4 03/01/2023 1940   MCH 29.5 03/01/2023 1940   MCHC 32.7 03/01/2023 1940   RDW 12.8 03/01/2023 1940   LYMPHSABS 2.1 09/20/2022 0747   MONOABS 0.6 09/20/2022 0747   EOSABS 0.2 09/20/2022 0747   BASOSABS 0.1 09/20/2022 0747    No results found for: "POCLITH", "LITHIUM"   No results found for: "PHENYTOIN", "PHENOBARB", "VALPROATE", "CBMZ"   .res Assessment: Plan:    Plan:  Hold Ritalin 20mg  - 1/2 tab BID for now  Prozac 40mg  daily Trazadone 100mg  - taking 1 at hs  as needed  D/C Ativan 1mg  three times daily - for anxiety and panic attacks. Add Xanax 1mg  BID for anxiety/panic  Monitor BP between visits   Reports previous seizure   Continue therapy   RTC 4 weeks  Discussed potential benefits, risk, and side effects of benzodiazepines to include potential risk of tolerance and dependence, as well as possible drowsiness.  Advised patient not to drive if experiencing drowsiness and to take lowest possible effective dose to minimize risk of dependence and tolerance.  Discussed potential benefits, risks, and side effects of stimulants with patient to include increased heart rate, palpitations, insomnia, increased anxiety, increased irritability, or decreased appetite.  Instructed patient to contact office if experiencing any significant tolerability issues.  There are no diagnoses linked to this encounter.   Please see After Visit Summary for patient specific instructions.  Future Appointments  Date Time Provider Department Center  03/17/2023  3:20 PM [facility]ree[facility] Angell, Thereasa Solo, NP [facility] None  03/23/2023  2:00 PM [facility]    No orders of the defined types were placed in this encounter.   -------------------------------

## 2023-03-18 DIAGNOSIS — M79661 Pain in right lower leg: Secondary | ICD-10-CM | POA: Diagnosis not present

## 2023-03-18 DIAGNOSIS — I83893 Varicose veins of bilateral lower extremities with other complications: Secondary | ICD-10-CM | POA: Diagnosis not present

## 2023-03-18 DIAGNOSIS — M79662 Pain in left lower leg: Secondary | ICD-10-CM | POA: Diagnosis not present

## 2023-03-18 DIAGNOSIS — I872 Venous insufficiency (chronic) (peripheral): Secondary | ICD-10-CM | POA: Diagnosis not present

## 2023-03-19 DIAGNOSIS — I83893 Varicose veins of bilateral lower extremities with other complications: Secondary | ICD-10-CM | POA: Diagnosis not present

## 2023-03-23 ENCOUNTER — Ambulatory Visit
Admission: RE | Admit: 2023-03-23 | Discharge: 2023-03-23 | Disposition: A | Discharge status: Home / Self Care | Payer: BC Managed Care – PPO | Source: Ambulatory Visit | Attending: Gastroenterology | Admitting: Gastroenterology

## 2023-03-23 DIAGNOSIS — K769 Liver disease, unspecified: Secondary | ICD-10-CM

## 2023-03-23 DIAGNOSIS — D1803 Hemangioma of intra-abdominal structures: Secondary | ICD-10-CM | POA: Diagnosis not present

## 2023-03-23 DIAGNOSIS — R160 Hepatomegaly, not elsewhere classified: Secondary | ICD-10-CM | POA: Diagnosis not present

## 2023-03-23 MED ORDER — GADOPICLENOL 0.5 MMOL/ML IV SOLN
10.0000 mL | Freq: Once | INTRAVENOUS | Status: AC | PRN
  Administered 2023-03-23: 10 mL via INTRAVENOUS

## 2023-03-25 DIAGNOSIS — Z8616 Personal history of COVID-19: Secondary | ICD-10-CM | POA: Diagnosis not present

## 2023-03-25 DIAGNOSIS — R5383 Other fatigue: Secondary | ICD-10-CM | POA: Diagnosis not present

## 2023-03-25 DIAGNOSIS — N920 Excessive and frequent menstruation with regular cycle: Secondary | ICD-10-CM | POA: Diagnosis not present

## 2023-03-25 DIAGNOSIS — E569 Vitamin deficiency, unspecified: Secondary | ICD-10-CM | POA: Diagnosis not present

## 2023-03-29 ENCOUNTER — Telehealth: Payer: Self-pay | Admitting: Gastroenterology

## 2023-03-29 NOTE — Telephone Encounter (Signed)
Patient is wishing to have a second opinion for liver disease and gall bladder. Patient is currently a patient with [facility]. Requesting a second opinion due to stating provider has missed details regarding her symptoms. Patient will have previous records sent for review.

## 2023-04-15 DIAGNOSIS — I83892 Varicose veins of left lower extremities with other complications: Secondary | ICD-10-CM | POA: Diagnosis not present

## 2023-04-21 DIAGNOSIS — I83891 Varicose veins of right lower extremities with other complications: Secondary | ICD-10-CM | POA: Diagnosis not present

## 2023-04-22 DIAGNOSIS — Z09 Encounter for follow-up examination after completed treatment for conditions other than malignant neoplasm: Secondary | ICD-10-CM | POA: Diagnosis not present

## 2023-04-22 DIAGNOSIS — I83892 Varicose veins of left lower extremities with other complications: Secondary | ICD-10-CM | POA: Diagnosis not present

## 2023-05-10 ENCOUNTER — Ambulatory Visit: Payer: BC Managed Care – PPO | Admitting: Obstetrics and Gynecology

## 2023-05-10 ENCOUNTER — Other Ambulatory Visit (HOSPITAL_COMMUNITY)
Admission: RE | Admit: 2023-05-10 | Discharge: 2023-05-10 | Disposition: A | Discharge status: Home / Self Care | Payer: BC Managed Care – PPO | Source: Ambulatory Visit | Attending: Family Medicine | Admitting: Family Medicine

## 2023-05-10 ENCOUNTER — Encounter: Payer: Self-pay | Admitting: Obstetrics and Gynecology

## 2023-05-10 VITALS — BP 126/69 | HR 102 | Wt 239.6 lb

## 2023-05-10 DIAGNOSIS — Z129 Encounter for screening for malignant neoplasm, site unspecified: Secondary | ICD-10-CM

## 2023-05-10 DIAGNOSIS — Z01419 Encounter for gynecological examination (general) (routine) without abnormal findings: Secondary | ICD-10-CM | POA: Diagnosis present

## 2023-05-10 DIAGNOSIS — N898 Other specified noninflammatory disorders of vagina: Secondary | ICD-10-CM | POA: Insufficient documentation

## 2023-05-10 DIAGNOSIS — Z1151 Encounter for screening for human papillomavirus (HPV): Secondary | ICD-10-CM

## 2023-05-10 DIAGNOSIS — Z113 Encounter for screening for infections with a predominantly sexual mode of transmission: Secondary | ICD-10-CM | POA: Diagnosis not present

## 2023-05-10 DIAGNOSIS — Z1239 Encounter for other screening for malignant neoplasm of breast: Secondary | ICD-10-CM

## 2023-05-10 LAB — POCT URINALYSIS DIP (DEVICE)
Bilirubin Urine: NEGATIVE
Glucose, UA: NEGATIVE mg/dL
Ketones, ur: 15 mg/dL — AB
Leukocytes,Ua: NEGATIVE
Nitrite: NEGATIVE
Protein, ur: NEGATIVE mg/dL
Specific Gravity, Urine: >=1.03 (ref 1.005–1.030)
Urobilinogen, UA: 0.2 mg/dL (ref 0.0–1.0)
pH: 5.5 (ref 5.0–8.0)

## 2023-05-10 NOTE — Progress Notes (Signed)
 GYNECOLOGY ANNUAL PREVENTATIVE CARE ENCOUNTER NOTE  History:     Christina [name] is a 38 y.o. [id number] female here for a routine annual gynecologic exam.  Current complaints: occasionally heavy menses.   Denies abnormal vaginal bleeding, discharge, pelvic pain, problems with intercourse or other gynecologic concerns.   Pt notes regular menses lasting 7 days with the last 5 days being heavy.  Pt is interested in mammogram early as she has two first degree relatives, mother and sister, with breast issues/cancer.   Gynecologic History Patient's last menstrual period was 04/24/2023 (exact date). Contraception: tubal ligation Last Pap:not previously recorded Last mammogram: n/a  Obstetric History OB History  Gravida Para Term Preterm AB Living  3 3 3  0 0 3  SAB IAB Ectopic Multiple Live Births  0 0 0 0 3    # Outcome Date GA Lbr Len/2nd Weight Sex Type Anes PTL Lv  3 Term 12/16/15 [redacted]w[redacted]d / 00:06 9 lb 5 oz (4.224 kg) M Vag-Spont EPI  LIV  2 Term 08/31/11 [redacted]w[redacted]d 04:30 / 00:15 7 lb 2 oz (3.232 kg) F Vag-Spont EPI  LIV  1 Term 2008 [redacted]w[redacted]d 28:00 8 lb 7 oz (3.827 kg) M Vag-Spont EPI  LIV    Past Medical History:  Diagnosis Date   Anemia    borderline anemic   Anxiety    Anxiety    Depression    Eczema    GERD (gastroesophageal reflux disease)    with pregnancy   History of hip fracture 10/2010   still has pain, right hip   Hyperemesis arising during pregnancy    Increased heart rate    resting heart rate 100-120   Low energy    on bedrest during pregnancy trying to regain strength   SVD (spontaneous vaginal delivery) 12/16/2015   SVT (supraventricular tachycardia) (HCC)    SVT (supraventricular tachycardia) (HCC)    UTI (lower urinary tract infection)    Vaginitis and vulvovaginitis     Past Surgical History:  Procedure Laterality Date   LAPAROSCOPIC TUBAL LIGATION Bilateral 02/28/2016   Procedure: LAPAROSCOPIC TUBAL LIGATION;  Surgeon: Krystal Deaner, MD;  Location:  [facility];  Service: Gynecology;  Laterality: Bilateral;   LAPAROSCOPY Left 06/20/2012   Procedure: LAPAROSCOPY OPERATIVE;  Surgeon: Krystal Deaner, MD;  Location: [facility];  Service: Gynecology;  Laterality: Left;  OPEN LAPAROSCOPY, removal of portion of left ovarian cyst wall   WISDOM TOOTH EXTRACTION      Current Outpatient Medications on File Prior to Visit  Medication Sig Dispense Refill   ALPRAZolam  (XANAX ) 1 MG tablet Take 1 tablet (1 mg total) by mouth 2 (two) times daily. 60 tablet 2   FLUoxetine  (PROZAC ) 40 MG capsule Take 1 capsule (40 mg total) by mouth daily. 30 capsule 5   amphetamine -dextroamphetamine  (ADDERALL) 20 MG tablet Take one tablet three times daily. (Patient not taking: Reported on 05/10/2023) 90 tablet 0   amphetamine -dextroamphetamine  (ADDERALL) 20 MG tablet Take 1 tablet (20 mg total) by mouth 2 (two) times daily. (Patient not taking: Reported on 05/10/2023) 60 tablet 0   amphetamine -dextroamphetamine  (ADDERALL) 20 MG tablet Take 1 tablet (20 mg total) by mouth 2 (two) times daily. (Patient not taking: Reported on 05/10/2023) 60 tablet 0   amphetamine -dextroamphetamine  (ADDERALL) 20 MG tablet Take 1 tablet (20 mg total) by mouth 2 (two) times daily. (Patient not taking: Reported on 05/10/2023) 60 tablet 0   loratadine (CLARITIN) 10 MG tablet Take by mouth. (Patient not taking: Reported on  05/10/2023)     LORazepam  (ATIVAN ) 1 MG tablet TAKE 1 TABLET 3 TIMES A DAY AS NEEDED FOR ANXIETY (Patient not taking: Reported on 05/10/2023) 90 tablet 0   methylphenidate  (RITALIN ) 20 MG tablet Take 1 tablet (20 mg total) by mouth daily. (Patient not taking: Reported on 05/10/2023) 30 tablet 0   metoprolol  tartrate (LOPRESSOR ) 25 MG tablet TAKE 1 TABLET BY MOUTH TWICE A DAY. MAY TAKE AN EXTRA ONE TABLET AS NEEDED FOR INCREASED HEART RATE. (Patient not taking: Reported on 05/10/2023) 60 tablet 0   Multiple Vitamin (MULTIVITAMIN WITH MINERALS) TABS tablet Take 1 tablet by mouth daily. (Patient not taking:  Reported on 05/10/2023)     traZODone  (DESYREL ) 100 MG tablet TAKE 1 TO 2 TABLETS BY MOUTH EVERY DAY AT BEDTIME FOR SLEEP (Patient not taking: Reported on 05/10/2023) 180 tablet 3   No current facility-administered medications on file prior to visit.    Allergies  Allergen Reactions   Sulfa Antibiotics Hives, Itching and Swelling   Latex Itching   Polymyxin B     Other reaction(s): eye redness/pain   Polymyxin B-Trimethoprim     Other reaction(s): eye redness/pain    Social History:  reports that she has never smoked. She has never used smokeless tobacco. She reports current alcohol use. She reports that she does not use drugs.  Family History  Problem Relation Age of Onset   Alcohol abuse Mother    Anxiety disorder Mother    Asthma Mother    Mental illness Mother        bipolar   Depression Mother    Hypothyroidism Mother    Ovarian cysts Mother    Hypertension Father    Endometriosis Sister    Ovarian cysts Sister    Alcohol abuse Brother    Crohn's disease Brother    Alcohol abuse Maternal Aunt    Cancer Maternal Aunt        breast   Hypothyroidism Maternal Aunt    Miscarriages / Stillbirths Maternal Aunt        stillbirth   Alcohol abuse Maternal Uncle    Asthma Maternal Grandmother    Mental illness Maternal Grandmother        bipolar   Asthma Maternal Grandfather    Diabetes Maternal Grandfather    Hypertension Maternal Grandfather    Asthma Paternal Grandmother    Hypertension Paternal Grandmother    Asthma Paternal Grandfather    Diabetes Paternal Grandfather    Heart disease Paternal Grandfather    Hypertension Paternal Grandfather     The following portions of the patient's history were reviewed and updated as appropriate: allergies, current medications, past family history, past medical history, past social history, past surgical history and problem list.  Review of Systems Pertinent items noted in HPI and remainder of comprehensive ROS otherwise  negative.  Physical Exam:  BP 126/69   Pulse (!) 102   Wt 239 lb 9.6 oz (108.7 kg)   LMP 04/24/2023 (Exact Date)   BMI 37.53 kg/m  CONSTITUTIONAL: Well-developed, well-nourished female in no acute distress.  HENT:  Normocephalic, atraumatic, External right and left ear normal. Oropharynx is clear and moist EYES: Conjunctivae and EOM are normal.  NECK: Normal range of motion, supple, no masses.  Normal thyroid.  SKIN: Skin is warm and dry. No rash noted. Not diaphoretic. No erythema. No pallor. MUSCULOSKELETAL: Normal range of motion. No tenderness.  No cyanosis, clubbing, or edema.  2+ distal pulses. NEUROLOGIC: Alert and oriented to person,  place, and time. Normal reflexes, muscle tone coordination.  PSYCHIATRIC: Normal mood and affect. Normal behavior. Normal judgment and thought content. CARDIOVASCULAR: Normal heart rate noted, regular rhythm RESPIRATORY: Clear to auscultation bilaterally. Effort and breath sounds normal, no problems with respiration noted. BREASTS: Symmetric in size.Performed in the presence of a chaperone. ABDOMEN: Soft, no distention noted.  No tenderness, rebound or guarding.  PELVIC: Normal appearing external genitalia and urethral meatus; normal appearing vaginal mucosa and cervix.  No abnormal discharge noted.  Pap smear obtained.  Vaginal swab taken.Normal uterine size, no other palpable masses, no uterine or adnexal tenderness.  Performed in the presence of a chaperone.   Assessment and Plan:    1. Cancer screening (Primary)  - Cytology - PAP( [place])  2. Women's annual routine gynecological examination Normal annual exam - Cytology - PAP( [place])  3. Vaginal irritation Vaginal swab results pending - Cervicovaginal ancillary only( [place])  4. Routine screening for STI (sexually transmitted infection) Per pt request  5. Breast screening  - MM 3D SCREENING MAMMOGRAM BILATERAL BREAST; Future  Will follow up results of pap smear and  manage accordingly. Mammogram scheduled Routine preventative health maintenance measures emphasized. Please refer to After Visit Summary for other counseling recommendations.      Jerilynn Buddle, MD, FACOG Obstetrician & Gynecologist, [facility] for [facility], [facility]

## 2023-05-12 LAB — CERVICOVAGINAL ANCILLARY ONLY
Bacterial Vaginitis (gardnerella): NEGATIVE
Candida Glabrata: NEGATIVE
Candida Vaginitis: NEGATIVE
Chlamydia: NEGATIVE
Comment: NEGATIVE
Comment: NEGATIVE
Comment: NEGATIVE
Comment: NEGATIVE
Comment: NEGATIVE
Comment: NORMAL
Neisseria Gonorrhea: NEGATIVE
Trichomonas: NEGATIVE

## 2023-05-13 LAB — CYTOLOGY - PAP
Comment: NEGATIVE
Diagnosis: UNDETERMINED — AB
High risk HPV: NEGATIVE

## 2023-05-27 ENCOUNTER — Ambulatory Visit: Payer: BC Managed Care – PPO

## 2023-06-07 ENCOUNTER — Other Ambulatory Visit: Payer: Self-pay | Admitting: Adult Health

## 2023-06-07 DIAGNOSIS — F428 Other obsessive-compulsive disorder: Secondary | ICD-10-CM

## 2023-06-07 DIAGNOSIS — F411 Generalized anxiety disorder: Secondary | ICD-10-CM

## 2023-06-07 NOTE — Telephone Encounter (Signed)
Please schedule pt an appt lv 11/13 due back in4 weeks.

## 2023-06-23 NOTE — Telephone Encounter (Signed)
Spoke to pt at 11:50a.  She doesn't know if virtual is covered with her insurance.  If not she can't be seen in the office until April.  She is going to call insurance to see if she can do virtual and hopefully be able to be seen earlier.  She will call back and schedule.

## 2023-07-23 ENCOUNTER — Ambulatory Visit (INDEPENDENT_AMBULATORY_CARE_PROVIDER_SITE_OTHER): Payer: Self-pay | Admitting: Adult Health

## 2023-07-23 DIAGNOSIS — Z0389 Encounter for observation for other suspected diseases and conditions ruled out: Secondary | ICD-10-CM

## 2023-07-23 NOTE — Progress Notes (Signed)
 Patient no show appointment. ? ?

## 2023-08-02 ENCOUNTER — Emergency Department (HOSPITAL_COMMUNITY)
Admission: EM | Admit: 2023-08-02 | Discharge: 2023-08-02 | Disposition: A | Discharge status: Home / Self Care | Attending: Emergency Medicine | Admitting: Emergency Medicine

## 2023-08-02 ENCOUNTER — Encounter (HOSPITAL_COMMUNITY): Payer: Self-pay

## 2023-08-02 ENCOUNTER — Other Ambulatory Visit: Payer: Self-pay

## 2023-08-02 ENCOUNTER — Emergency Department (HOSPITAL_BASED_OUTPATIENT_CLINIC_OR_DEPARTMENT_OTHER)

## 2023-08-02 DIAGNOSIS — R21 Rash and other nonspecific skin eruption: Secondary | ICD-10-CM

## 2023-08-02 DIAGNOSIS — R2243 Localized swelling, mass and lump, lower limb, bilateral: Secondary | ICD-10-CM | POA: Diagnosis present

## 2023-08-02 DIAGNOSIS — M79661 Pain in right lower leg: Secondary | ICD-10-CM | POA: Diagnosis not present

## 2023-08-02 DIAGNOSIS — R600 Localized edema: Secondary | ICD-10-CM | POA: Insufficient documentation

## 2023-08-02 HISTORY — DX: Fatty (change of) liver, not elsewhere classified: K76.0

## 2023-08-02 LAB — COMPREHENSIVE METABOLIC PANEL WITH GFR
ALT: 43 U/L (ref 0–44)
AST: 38 U/L (ref 15–41)
Albumin: 4.1 g/dL (ref 3.5–5.0)
Alkaline Phosphatase: 49 U/L (ref 38–126)
Anion gap: 12 (ref 5–15)
BUN: 12 mg/dL (ref 6–20)
CO2: 17 mmol/L — ABNORMAL LOW (ref 22–32)
Calcium: 9.1 mg/dL (ref 8.9–10.3)
Chloride: 107 mmol/L (ref 98–111)
Creatinine, Ser: 0.59 mg/dL (ref 0.44–1.00)
GFR, Estimated: >60 mL/min (ref >60)
Glucose, Bld: 88 mg/dL (ref 70–99)
Potassium: 4.5 mmol/L (ref 3.5–5.1)
Sodium: 136 mmol/L (ref 135–145)
Total Bilirubin: 0.8 mg/dL (ref 0.0–1.2)
Total Protein: 7 g/dL (ref 6.5–8.1)

## 2023-08-02 LAB — CBC WITH DIFFERENTIAL/PLATELET
Abs Immature Granulocytes: 0.06 10*3/uL (ref 0.00–0.07)
Basophils Absolute: 0.1 10*3/uL (ref 0.0–0.1)
Basophils Relative: 1 %
Eosinophils Absolute: 0.4 10*3/uL (ref 0.0–0.5)
Eosinophils Relative: 4 %
HCT: 39 % (ref 36.0–46.0)
Hemoglobin: 12.4 g/dL (ref 12.0–15.0)
Immature Granulocytes: 1 %
Lymphocytes Relative: 28 %
Lymphs Abs: 2.8 10*3/uL (ref 0.7–4.0)
MCH: 28.5 pg (ref 26.0–34.0)
MCHC: 31.8 g/dL (ref 30.0–36.0)
MCV: 89.7 fL (ref 80.0–100.0)
Monocytes Absolute: 0.9 10*3/uL (ref 0.1–1.0)
Monocytes Relative: 9 %
Neutro Abs: 6.1 10*3/uL (ref 1.7–7.7)
Neutrophils Relative %: 57 %
Platelets: 324 10*3/uL (ref 150–400)
RBC: 4.35 MIL/uL (ref 3.87–5.11)
RDW: 13.4 % (ref 11.5–15.5)
WBC: 10.2 10*3/uL (ref 4.0–10.5)
nRBC: 0 % (ref 0.0–0.2)

## 2023-08-02 MED ORDER — CEPHALEXIN 500 MG PO CAPS: 500.0000 mg | ORAL_CAPSULE | Freq: Two times a day (BID) | ORAL | 0 refills | Status: AC

## 2023-08-02 NOTE — ED Notes (Signed)
 Pt states that she did have a 2hr flight today. Notes swelling started today. Had recent bilateral vein removal in legs. Currently taking progesterone. Denies and chest pain or shortness of breath.

## 2023-08-02 NOTE — Discharge Instructions (Addendum)
 Keep your appoint with your vascular specialist tomorrow  Wear your compression socks, elevate the foot  Start taking the medication prescribed- Keflex  Return for new or worsening symptoms

## 2023-08-02 NOTE — ED Provider Notes (Signed)
  EMERGENCY DEPARTMENT AT [facility] Provider Note   CSN: [id number] Arrival date & time: 08/02/23  1203    History {Add pertinent medical, surgical, social history, OB history to HPI:1} Chief Complaint  Patient presents with   Foot Swelling    Christina [name] is a 38 y.o. female history of Elita Boone, recurrent bilateral lower extremity swelling secondary to lymphedema here for evaluation of lower extremity swelling.  Woke up this morning noted swelling to her bilateral feet.  Left foot resolved however still having some swelling to her right foot.  She has a chronic rash surrounding her ankle and her right lower extremity.  She was told by dermatology this is contact dermatitis and was given a topical prescription.  She had a lymphatic procedure on her left leg 8 weeks ago, 4 weeks ago on her right leg.  She states she was actually told by her vascular surgeon that the rash on her leg is likely due to the lymphedema.  She states she has some tingling to the foot.  No recent falls or injuries.  No fever back pain, vomiting, chest pain, shortness of breath.   Stage 4 cirrhosis, BLE swelling, both feet, R>L, burning, from foot to knee. Walking helps. Has had in the past.   No pain, claudication, weakness. No CP, SOB, back pain,      HPI     Home Medications Prior to Admission medications   Medication Sig Start Date End Date Taking? Authorizing Provider  cephALEXin (KEFLEX) 500 MG capsule Take 1 capsule (500 mg total) by mouth 2 (two) times daily for 5 days. 08/02/23 08/07/23 Yes Karianne Nogueira A, PA-C  ALPRAZolam (XANAX) 1 MG tablet Take 1 tablet (1 mg total) by mouth 2 (two) times daily. 03/17/23   Mozingo, Thereasa Solo, NP  amphetamine-dextroamphetamine (ADDERALL) 20 MG tablet Take one tablet three times daily. Patient not taking: Reported on 05/10/2023 11/14/19   Mozingo, Thereasa Solo, NP  amphetamine-dextroamphetamine (ADDERALL) 20 MG tablet Take 1 tablet (20 mg  total) by mouth 2 (two) times daily. Patient not taking: Reported on 05/10/2023 07/28/22   Mozingo, Thereasa Solo, NP  amphetamine-dextroamphetamine (ADDERALL) 20 MG tablet Take 1 tablet (20 mg total) by mouth 2 (two) times daily. Patient not taking: Reported on 05/10/2023 08/25/22   Mozingo, Thereasa Solo, NP  amphetamine-dextroamphetamine (ADDERALL) 20 MG tablet Take 1 tablet (20 mg total) by mouth 2 (two) times daily. Patient not taking: Reported on 05/10/2023 09/22/22   Mozingo, Thereasa Solo, NP  FLUoxetine (PROZAC) 40 MG capsule Take 1 capsule (40 mg total) by mouth daily. 03/17/23   Mozingo, Thereasa Solo, NP  loratadine (CLARITIN) 10 MG tablet Take by mouth. Patient not taking: Reported on 05/10/2023    [provider]  LORazepam (ATIVAN) 1 MG tablet TAKE 1 TABLET 3 TIMES A DAY AS NEEDED FOR ANXIETY Patient not taking: Reported on 05/10/2023 03/02/23   Mozingo, Thereasa Solo, NP  methylphenidate (RITALIN) 20 MG tablet Take 1 tablet (20 mg total) by mouth daily. Patient not taking: Reported on 05/10/2023 02/16/23   Mozingo, Thereasa Solo, NP  metoprolol tartrate (LOPRESSOR) 25 MG tablet TAKE 1 TABLET BY MOUTH TWICE A DAY. MAY TAKE AN EXTRA ONE TABLET AS NEEDED FOR INCREASED HEART RATE. Patient not taking: Reported on 05/10/2023 12/15/22   Marinus Maw, MD  Multiple Vitamin (MULTIVITAMIN WITH MINERALS) TABS tablet Take 1 tablet by mouth daily. Patient not taking: Reported on 05/10/2023    [provider]  traZODone (DESYREL)  100 MG tablet TAKE 1 TO 2 TABLETS BY MOUTH EVERY DAY AT BEDTIME FOR SLEEP Patient not taking: Reported on 05/10/2023 07/28/22   Mozingo, Thereasa Solo, NP      Allergies    Sulfa antibiotics, Latex, Polymyxin b, and Polymyxin b-trimethoprim    Review of Systems   Review of Systems  Physical Exam Updated Vital Signs BP 128/73 (BP Location: Left Arm)   Pulse 83   Temp 98.9 F (37.2 C) (Oral)   Resp 16   Ht 5\' 7"  (1.702 m)   Wt 102.1 kg   LMP  07/12/2023   SpO2 97%   BMI 35.24 kg/m  Physical Exam  ED Results / Procedures / Treatments   Labs (all labs ordered are listed, but only abnormal results are displayed) Labs Reviewed  COMPREHENSIVE METABOLIC PANEL WITH GFR - Abnormal; Notable for the following components:      Result Value   CO2 17 (*)    All other components within normal limits  CBC WITH DIFFERENTIAL/PLATELET    EKG None  Radiology VAS [place] LOWER EXTREMITY VENOUS (DVT) (ONLY [facility] & [facility]) Result Date: 08/02/2023  Lower Venous DVT Study Patient Name:  Christina [name]  Date of Exam:   08/02/2023 Medical Rec #: [id number]         Accession #:    [id number] Date of Birth: 18-Jun-1985          Patient Gender: F Patient Age:   56 years Exam Location:  [facility] Procedure:      VAS [place] LOWER EXTREMITY VENOUS (DVT) Referring Phys: MICHAEL PENNA --------------------------------------------------------------------------------  Indications: Swelling, and tingling.  Risk Factors: "vein surgery" 8 weeks ago, 3 hour flight earlier today. Comparison Study: No previous exams Performing Technologist: Jody Hill RVT, RDMS  Examination Guidelines: A complete evaluation includes B-mode imaging, spectral Doppler, color Doppler, and power Doppler as needed of all accessible portions of each vessel. Bilateral testing is considered an integral part of a complete examination. Limited examinations for reoccurring indications may be performed as noted. The reflux portion of the exam is performed with the patient in reverse Trendelenburg.  +---------+---------------+---------+-----------+----------+--------------+ RIGHT    CompressibilityPhasicitySpontaneityPropertiesThrombus Aging +---------+---------------+---------+-----------+----------+--------------+ CFV      Full           Yes      Yes                                 +---------+---------------+---------+-----------+----------+--------------+ SFJ      Full                                                         +---------+---------------+---------+-----------+----------+--------------+ FV Prox  Full           Yes      Yes                                 +---------+---------------+---------+-----------+----------+--------------+ FV Mid   Full           Yes      Yes                                 +---------+---------------+---------+-----------+----------+--------------+  FV DistalFull           Yes      Yes                                 +---------+---------------+---------+-----------+----------+--------------+ PFV      Full                                                        +---------+---------------+---------+-----------+----------+--------------+ POP      Full           Yes      Yes                                 +---------+---------------+---------+-----------+----------+--------------+ PTV      Full                                                        +---------+---------------+---------+-----------+----------+--------------+ PERO     Full                                                        +---------+---------------+---------+-----------+----------+--------------+   +---------+---------------+---------+-----------+----------+--------------+ LEFT     CompressibilityPhasicitySpontaneityPropertiesThrombus Aging +---------+---------------+---------+-----------+----------+--------------+ CFV      Full           Yes      Yes                                 +---------+---------------+---------+-----------+----------+--------------+ SFJ      Full                                                        +---------+---------------+---------+-----------+----------+--------------+ FV Prox  Full           Yes      Yes                                 +---------+---------------+---------+-----------+----------+--------------+ FV Mid   Full           Yes      Yes                                  +---------+---------------+---------+-----------+----------+--------------+ FV DistalFull           Yes      Yes                                 +---------+---------------+---------+-----------+----------+--------------+ PFV      Full                                                        +---------+---------------+---------+-----------+----------+--------------+  POP      Full           Yes      Yes                                 +---------+---------------+---------+-----------+----------+--------------+ PTV      Full                                                        +---------+---------------+---------+-----------+----------+--------------+ PERO     Full                                                        +---------+---------------+---------+-----------+----------+--------------+     Summary: BILATERAL: - No evidence of deep vein thrombosis seen in the lower extremities, bilaterally. -No evidence of popliteal cyst, bilaterally.   *See table(s) above for measurements and observations. Electronically signed by Coral Else MD on 08/02/2023 at 4:26:14 PM.    Final     Procedures Procedures  {Document cardiac monitor, telemetry assessment procedure when appropriate:1}  Medications Ordered in ED Medications - No data to display  ED Course/ Medical Decision Making/ A&P   {   Click here for ABCD2, HEART and other calculatorsREFRESH Note before signing :1}                              Medical Decision Making Risk Prescription drug management.     {Document critical care time when appropriate:1} {Document review of labs and clinical decision tools ie heart score, Chads2Vasc2 etc:1}  {Document your independent review of radiology images, and any outside records:1} {Document your discussion with family members, caretakers, and with consultants:1} {Document social determinants of health affecting pt's care:1} {Document your decision making why or why not  admission, treatments were needed:1} Final Clinical Impression(s) / ED Diagnoses Final diagnoses:  Pedal edema  Rash    Rx / DC Orders ED Discharge Orders          Ordered    cephALEXin (KEFLEX) 500 MG capsule  2 times daily        08/02/23 1741

## 2023-08-02 NOTE — Progress Notes (Signed)
 BLE venous duplex has been completed.  Preliminary results given to Dr. Eloise Harman.   Results can be found under chart review under CV PROC. 08/02/2023 4:16 PM Jaymond Waage RVT, RDMS

## 2023-08-02 NOTE — ED Triage Notes (Signed)
 Pt has bilateral feet swelling with some associated tingling. Pt had vein surgery 8 weeks ago.

## 2023-08-09 ENCOUNTER — Telehealth: Admitting: Adult Health

## 2023-08-09 ENCOUNTER — Encounter: Payer: Self-pay | Admitting: Adult Health

## 2023-08-09 DIAGNOSIS — F411 Generalized anxiety disorder: Secondary | ICD-10-CM | POA: Diagnosis not present

## 2023-08-09 DIAGNOSIS — F331 Major depressive disorder, recurrent, moderate: Secondary | ICD-10-CM | POA: Diagnosis not present

## 2023-08-09 DIAGNOSIS — F428 Other obsessive-compulsive disorder: Secondary | ICD-10-CM

## 2023-08-09 DIAGNOSIS — F909 Attention-deficit hyperactivity disorder, unspecified type: Secondary | ICD-10-CM | POA: Diagnosis not present

## 2023-08-09 DIAGNOSIS — G4700 Insomnia, unspecified: Secondary | ICD-10-CM

## 2023-08-09 MED ORDER — ALPRAZOLAM 1 MG PO TABS: 1.0000 mg | ORAL_TABLET | Freq: Two times a day (BID) | ORAL | 2 refills | Status: DC

## 2023-08-09 MED ORDER — FLUOXETINE HCL 40 MG PO CAPS: 40.0000 mg | ORAL_CAPSULE | Freq: Every day | ORAL | 5 refills | Status: DC

## 2023-08-09 NOTE — Progress Notes (Signed)
 Christina [name] [id number] Dec 29, 1985 38 y.o.  Virtual Visit via Video Note  I connected with pt @ on 08/09/23 at  4:00 PM EDT by a video enabled telemedicine application and verified that I am speaking with the correct person using two identifiers.   I discussed the limitations of evaluation and management by telemedicine and the availability of in person appointments. The patient expressed understanding and agreed to proceed.  I discussed the assessment and treatment plan with the patient. The patient was provided an opportunity to ask questions and all were answered. The patient agreed with the plan and demonstrated an understanding of the instructions.   The patient was advised to call back or seek an in-person evaluation if the symptoms worsen or if the condition fails to improve as anticipated.  I provided 25 minutes of non-face-to-face time during this encounter.  The patient was located at home.  The provider was located at [facility].   Dorothyann Gibbs, NP   Subjective:   Patient ID:  Christina [name] is a 38 y.o. (DOB 1985-10-16) female.  Chief Complaint: No chief complaint on file.   HPI Christina [name] presents for follow-up of GAD, MDD, insomnia, obsessional thoughts and ADHD.  Describes mood today as "ok". Pleasant. Reports tearfulness. Mood symptoms - reports some depression and anxiety. Reports irritability. Reports stable interest and motivation. Reports panic attacks - situational. Reports worry, rumination and over thinking. Reports obsessive thoughts, denies acts. Mood is stable. Stating "I feel like I'm doing ok". Taking medications as prescribed.  Energy levels improved. Active, does not have a regular exercise routine. Enjoys some usual interests and activities. Single. Lives alone with lives with 3 children and dog. Appetite adequate. Weight loss - 225 pounds. Sleeps well at night. Averages 7 to 8 hours.  Focus and concentration "off". Completing  tasks. Managing aspects of household. Working 2 full time jobs. Denies SI or HI.  Denies AH or VH. Denies self harm. Denies substance use.   Review of Systems:  Review of Systems  Musculoskeletal:  Negative for gait problem.  Neurological:  Negative for tremors.  Psychiatric/Behavioral:         Please refer to HPI    Medications: I have reviewed the patient's current medications.  Current Outpatient Medications  Medication Sig Dispense Refill   ALPRAZolam (XANAX) 1 MG tablet Take 1 tablet (1 mg total) by mouth 2 (two) times daily. 60 tablet 2   amphetamine-dextroamphetamine (ADDERALL) 20 MG tablet Take one tablet three times daily. (Patient not taking: Reported on 05/10/2023) 90 tablet 0   amphetamine-dextroamphetamine (ADDERALL) 20 MG tablet Take 1 tablet (20 mg total) by mouth 2 (two) times daily. (Patient not taking: Reported on 05/10/2023) 60 tablet 0   amphetamine-dextroamphetamine (ADDERALL) 20 MG tablet Take 1 tablet (20 mg total) by mouth 2 (two) times daily. (Patient not taking: Reported on 05/10/2023) 60 tablet 0   amphetamine-dextroamphetamine (ADDERALL) 20 MG tablet Take 1 tablet (20 mg total) by mouth 2 (two) times daily. (Patient not taking: Reported on 05/10/2023) 60 tablet 0   FLUoxetine (PROZAC) 40 MG capsule Take 1 capsule (40 mg total) by mouth daily. 30 capsule 5   loratadine (CLARITIN) 10 MG tablet Take by mouth. (Patient not taking: Reported on 05/10/2023)     LORazepam (ATIVAN) 1 MG tablet TAKE 1 TABLET 3 TIMES A DAY AS NEEDED FOR ANXIETY (Patient not taking: Reported on 05/10/2023) 90 tablet 0   methylphenidate (RITALIN) 20 MG tablet Take 1 tablet (20  mg total) by mouth daily. (Patient not taking: Reported on 05/10/2023) 30 tablet 0   metoprolol tartrate (LOPRESSOR) 25 MG tablet TAKE 1 TABLET BY MOUTH TWICE A DAY. MAY TAKE AN EXTRA ONE TABLET AS NEEDED FOR INCREASED HEART RATE. (Patient not taking: Reported on 05/10/2023) 60 tablet 0   Multiple Vitamin (MULTIVITAMIN WITH  MINERALS) TABS tablet Take 1 tablet by mouth daily. (Patient not taking: Reported on 05/10/2023)     traZODone (DESYREL) 100 MG tablet TAKE 1 TO 2 TABLETS BY MOUTH EVERY DAY AT BEDTIME FOR SLEEP (Patient not taking: Reported on 05/10/2023) 180 tablet 3   No current facility-administered medications for this visit.    Medication Side Effects: None  Allergies:  Allergies  Allergen Reactions   Sulfa Antibiotics Hives, Itching and Swelling   Latex Itching   Polymyxin B     Other reaction(s): eye redness/pain   Polymyxin B-Trimethoprim     Other reaction(s): eye redness/pain    Past Medical History:  Diagnosis Date   Anemia    borderline anemic   Anxiety    Anxiety    Depression    Eczema    GERD (gastroesophageal reflux disease)    with pregnancy   History of hip fracture 10/2010   still has pain, right hip   Hyperemesis arising during pregnancy    Increased heart rate    resting heart rate 100-120   Low energy    on bedrest during pregnancy trying to regain strength   NAFLD (nonalcoholic fatty liver disease)    SVD (spontaneous vaginal delivery) 12/16/2015   SVT (supraventricular tachycardia) (HCC)    SVT (supraventricular tachycardia) (HCC)    UTI (lower urinary tract infection)    Vaginitis and vulvovaginitis     Family History  Problem Relation Age of Onset   Alcohol abuse Mother    Anxiety disorder Mother    Asthma Mother    Mental illness Mother        bipolar   Depression Mother    Hypothyroidism Mother    Ovarian cysts Mother    Hypertension Father    Endometriosis Sister    Ovarian cysts Sister    Alcohol abuse Brother    Crohn's disease Brother    Alcohol abuse Maternal Aunt    Cancer Maternal Aunt        breast   Hypothyroidism Maternal Aunt    Miscarriages / Stillbirths Maternal Aunt        stillbirth   Alcohol abuse Maternal Uncle    Asthma Maternal Grandmother    Mental illness Maternal Grandmother        bipolar   Asthma Maternal Grandfather     Diabetes Maternal Grandfather    Hypertension Maternal Grandfather    Asthma Paternal Grandmother    Hypertension Paternal Grandmother    Asthma Paternal Grandfather    Diabetes Paternal Grandfather    Heart disease Paternal Grandfather    Hypertension Paternal Grandfather     Social History   Socioeconomic History   Marital status: Married    Spouse name: Not on file   Number of children: Not on file   Years of education: Not on file   Highest education level: Not on file  Occupational History   Not on file  Tobacco Use   Smoking status: Never   Smokeless tobacco: Never  Vaping Use   Vaping status: Never Used  Substance and Sexual Activity   Alcohol use: Not Currently    Comment: occ  Drug use: No   Sexual activity: Yes    Birth control/protection: None  Other Topics Concern   Not on file  Social History Narrative   Not on file   Social Drivers of Health   Financial Resource Strain: Not on file  Food Insecurity: Not on file  Transportation Needs: Not on file  Physical Activity: Not on file  Stress: Not on file  Social Connections: Unknown (09/06/2021)   [facility]   Social Network    Social Network: Not on file  Intimate Partner Violence: Unknown (08/04/2021)   Received from [facility], [facility]   HITS    Physically Hurt: Not on file    Insult or Talk Down To: Not on file    Threaten Physical Harm: Not on file    Scream or Curse: Not on file    Past Medical History, Surgical history, Social history, and Family history were reviewed and updated as appropriate.   Please see review of systems for further details on the patient's review from today.   Objective:   Physical Exam:  LMP 07/12/2023   Physical Exam Constitutional:      General: She is not in acute distress. Musculoskeletal:        General: No deformity.  Neurological:     Mental Status: She is alert and oriented to person, place, and time.      Coordination: Coordination normal.  Psychiatric:        Attention and Perception: Attention and perception normal. She does not perceive auditory or visual hallucinations.        Mood and Affect: Affect is not labile, blunt, angry or inappropriate.        Speech: Speech normal.        Behavior: Behavior normal.        Thought Content: Thought content normal. Thought content is not paranoid or delusional. Thought content does not include homicidal or suicidal ideation. Thought content does not include homicidal or suicidal plan.        Cognition and Memory: Cognition and memory normal.        Judgment: Judgment normal.     Comments: Insight intact     Lab Review:     Component Value Date/Time   NA 136 08/02/2023 1434   K 4.5 08/02/2023 1434   CL 107 08/02/2023 1434   CO2 17 (L) 08/02/2023 1434   GLUCOSE 88 08/02/2023 1434   BUN 12 08/02/2023 1434   CREATININE 0.59 08/02/2023 1434   CALCIUM 9.1 08/02/2023 1434   PROT 7.0 08/02/2023 1434   ALBUMIN 4.1 08/02/2023 1434   AST 38 08/02/2023 1434   ALT 43 08/02/2023 1434   ALKPHOS 49 08/02/2023 1434   BILITOT 0.8 08/02/2023 1434   GFRNONAA >60 08/02/2023 1434   GFRAA >60 04/07/2019 1834       Component Value Date/Time   WBC 10.2 08/02/2023 1434   RBC 4.35 08/02/2023 1434   HGB 12.4 08/02/2023 1434   HCT 39.0 08/02/2023 1434   PLT 324 08/02/2023 1434   MCV 89.7 08/02/2023 1434   MCH 28.5 08/02/2023 1434   MCHC 31.8 08/02/2023 1434   RDW 13.4 08/02/2023 1434   LYMPHSABS 2.8 08/02/2023 1434   MONOABS 0.9 08/02/2023 1434   EOSABS 0.4 08/02/2023 1434   BASOSABS 0.1 08/02/2023 1434    No results found for: "POCLITH", "LITHIUM"   No results found for: "PHENYTOIN", "PHENOBARB", "VALPROATE", "CBMZ"   .res Assessment: Plan:  Plan:  Ritalin 20mg  - not taking  Trazadone 100mg  - taking 1 at hs as needed   Prozac 40mg  daily Xanax 1mg  BID for anxiety/panic  Monitor BP between visits   Reports seizure  history  Continue therapy   RTC 6 weeks  Discussed potential benefits, risk, and side effects of benzodiazepines to include potential risk of tolerance and dependence, as well as possible drowsiness.  Advised patient not to drive if experiencing drowsiness and to take lowest possible effective dose to minimize risk of dependence and tolerance.  Discussed potential benefits, risks, and side effects of stimulants with patient to include increased heart rate, palpitations, insomnia, increased anxiety, increased irritability, or decreased appetite.  Instructed patient to contact office if experiencing any significant tolerability issues.  There are no diagnoses linked to this encounter.   Please see After Visit Summary for patient specific instructions.  No future appointments.  No orders of the defined types were placed in this encounter.     -------------------------------

## 2023-08-10 ENCOUNTER — Emergency Department (HOSPITAL_COMMUNITY)
Admission: EM | Admit: 2023-08-10 | Discharge: 2023-08-10 | Disposition: A | Discharge status: Home / Self Care | Attending: Emergency Medicine | Admitting: Emergency Medicine

## 2023-08-10 ENCOUNTER — Other Ambulatory Visit: Payer: Self-pay

## 2023-08-10 DIAGNOSIS — L309 Dermatitis, unspecified: Secondary | ICD-10-CM | POA: Insufficient documentation

## 2023-08-10 DIAGNOSIS — R21 Rash and other nonspecific skin eruption: Secondary | ICD-10-CM | POA: Diagnosis present

## 2023-08-10 DIAGNOSIS — Z91040 Latex allergy status: Secondary | ICD-10-CM | POA: Diagnosis not present

## 2023-08-10 MED ORDER — PERMETHRIN 5 % EX CREA: TOPICAL_CREAM | CUTANEOUS | 0 refills | Status: AC

## 2023-08-10 MED ORDER — PREDNISONE 10 MG PO TABS: 20.0000 mg | ORAL_TABLET | Freq: Every day | ORAL | 0 refills | Status: AC

## 2023-08-10 NOTE — Discharge Instructions (Signed)
 Prednisone given for itching/rash.  Take full course if needed, otherwise use permetherin as prescribed and return should rash fail to improve over the next week or should new shortness of breath/cough develop.

## 2023-08-10 NOTE — ED Provider Notes (Signed)
 [facility] Provider Note   CSN: [id number] Arrival date & time: 08/10/23  2952     History  Chief Complaint  Patient presents with   Rash    Christina [name] is a 38 y.o. female.  She presents with a three day hx of rash to the bilateral ankles and wrists, as well as intense itching to the base of the neck and scalp.  No cough or coryza.  She was recently travelling from [place] and was alerted to a case of measles at the airport she was transiting.  With presentation of rash 3 days ago, she is concerned that this may be measles since they were alerted this morning to the potential exposure.  She does have a 59 y/o child that has not been symptomatic.  Fiance is a patient in this ED as well for concern of measles, though he is asymptomatic.  The history is provided by the patient.  Rash Initially began on the bilateral ankles, also bilateral wrists.  Chronic patch on the bilateral ankles, c/o papular rash on the torso as well as up both arms that has spread centrally from the distal extremities.     Home Medications Prior to Admission medications   Medication Sig Start Date End Date Taking? Authorizing Provider  permethrin (ELIMITE) 5 % cream Apply to affected area once 08/10/23  Yes Harold Hedge, PA  predniSONE (DELTASONE) 10 MG tablet Take 2 tablets (20 mg total) by mouth daily for 5 days. 08/10/23 08/15/23 Yes Harold Hedge, PA  ALPRAZolam Prudy Feeler) 1 MG tablet Take 1 tablet (1 mg total) by mouth 2 (two) times daily. 08/09/23   Mozingo, Thereasa Solo, NP  amphetamine-dextroamphetamine (ADDERALL) 20 MG tablet Take one tablet three times daily. Patient not taking: Reported on 05/10/2023 11/14/19   Mozingo, Thereasa Solo, NP  amphetamine-dextroamphetamine (ADDERALL) 20 MG tablet Take 1 tablet (20 mg total) by mouth 2 (two) times daily. Patient not taking: Reported on 05/10/2023 07/28/22   Mozingo, Thereasa Solo, NP   amphetamine-dextroamphetamine (ADDERALL) 20 MG tablet Take 1 tablet (20 mg total) by mouth 2 (two) times daily. Patient not taking: Reported on 05/10/2023 08/25/22   Mozingo, Thereasa Solo, NP  amphetamine-dextroamphetamine (ADDERALL) 20 MG tablet Take 1 tablet (20 mg total) by mouth 2 (two) times daily. Patient not taking: Reported on 05/10/2023 09/22/22   Mozingo, Thereasa Solo, NP  FLUoxetine (PROZAC) 40 MG capsule Take 1 capsule (40 mg total) by mouth daily. 08/09/23   Mozingo, Thereasa Solo, NP  loratadine (CLARITIN) 10 MG tablet Take by mouth. Patient not taking: Reported on 05/10/2023    [provider]  LORazepam (ATIVAN) 1 MG tablet TAKE 1 TABLET 3 TIMES A DAY AS NEEDED FOR ANXIETY Patient not taking: Reported on 05/10/2023 03/02/23   Mozingo, Thereasa Solo, NP  methylphenidate (RITALIN) 20 MG tablet Take 1 tablet (20 mg total) by mouth daily. Patient not taking: Reported on 05/10/2023 02/16/23   Mozingo, Thereasa Solo, NP  metoprolol tartrate (LOPRESSOR) 25 MG tablet TAKE 1 TABLET BY MOUTH TWICE A DAY. MAY TAKE AN EXTRA ONE TABLET AS NEEDED FOR INCREASED HEART RATE. Patient not taking: Reported on 05/10/2023 12/15/22   Marinus Maw, MD  Multiple Vitamin (MULTIVITAMIN WITH MINERALS) TABS tablet Take 1 tablet by mouth daily. Patient not taking: Reported on 05/10/2023    [provider]  traZODone (DESYREL) 100 MG tablet TAKE 1 TO 2 TABLETS BY MOUTH EVERY DAY AT BEDTIME FOR SLEEP Patient  not taking: Reported on 05/10/2023 07/28/22   Mozingo, Thereasa Solo, NP      Allergies    Sulfa antibiotics, Latex, Polymyxin b, and Polymyxin b-trimethoprim    Review of Systems   Review of Systems  Skin:  Positive for rash.    Physical Exam Updated Vital Signs BP 113/74 (BP Location: Right Arm)   Pulse 79   Temp 97.8 F (36.6 C)   Resp 18   LMP 07/12/2023   SpO2 99%  Physical Exam Vitals and nursing note reviewed.  Constitutional:      General: She is not in acute  distress.    Appearance: Normal appearance.  HENT:     Head: Normocephalic and atraumatic.     Mouth/Throat:     Mouth: Mucous membranes are moist.     Pharynx: Oropharynx is clear.  Eyes:     Extraocular Movements: Extraocular movements intact.     Conjunctiva/sclera: Conjunctivae normal.     Pupils: Pupils are equal, round, and reactive to light.  Cardiovascular:     Rate and Rhythm: Normal rate and regular rhythm.     Pulses: Normal pulses.     Heart sounds: Normal heart sounds.  Pulmonary:     Effort: Pulmonary effort is normal.     Breath sounds: Normal breath sounds.  Abdominal:     General: Abdomen is flat. Bowel sounds are normal.     Palpations: Abdomen is soft.  Musculoskeletal:        General: Normal range of motion.     Cervical back: Normal range of motion and neck supple.     Right lower leg: No edema.     Left lower leg: No edema.  Skin:    General: Skin is warm and dry.     Capillary Refill: Capillary refill takes less than 2 seconds.     Findings: Rash present.     Comments: Erythematous and excoriated patches noted to medial bilateral ankles with R>L.  Further papules noted on the forearms, shoulders and posterior trunk.  Further noted small papules to the dorsum of the bilateral feet.  Neurological:     General: No focal deficit present.     Mental Status: She is alert. Mental status is at baseline.  Psychiatric:        Mood and Affect: Mood normal.      ED Results / Procedures / Treatments   Labs (all labs ordered are listed, but only abnormal results are displayed) Labs Reviewed - No data to display  EKG None  Radiology No results found.  Procedures Procedures    Medications Ordered in ED Medications - No data to display  ED Course/ Medical Decision Making/ A&P                                 Medical Decision Making Given history of exposure, considered measles though with lack of [place] spots and papular nature of the rash, do not  suspect at this time.  Further considered RMSF, however she has no outdoor exposure of concern.  Potentially this may be secondary to a parasitic dermatitis, discussed with patient to begin permetherin to manage potential Scabies.  Risk Prescription drug management.           Final Clinical Impression(s) / ED Diagnoses Final diagnoses:  Dermatitis    Rx / DC Orders ED Discharge Orders  Ordered    permethrin (ELIMITE) 5 % cream        08/10/23 0835    predniSONE (DELTASONE) 10 MG tablet  Daily        08/10/23 0835              Harold Hedge, PA 08/10/23 3329    Eber [facility], MD 08/12/23 1030

## 2023-08-10 NOTE — ED Triage Notes (Signed)
 Patient coming to ED for evaluation of rash.  Reports she was seen here last week for same and at Urgent Care.  Last night she was notified that she was exposed to measles during a flight on Sunday/Monday last week.  C/o itching all over.  Has been given steroids and currently on Rx medications.

## 2023-11-20 ENCOUNTER — Other Ambulatory Visit: Payer: Self-pay | Admitting: Adult Health

## 2023-11-20 DIAGNOSIS — F411 Generalized anxiety disorder: Secondary | ICD-10-CM

## 2023-11-20 DIAGNOSIS — F428 Other obsessive-compulsive disorder: Secondary | ICD-10-CM

## 2023-12-20 ENCOUNTER — Telehealth: Payer: Self-pay | Admitting: Adult Health

## 2023-12-20 NOTE — Telephone Encounter (Signed)
 Pt has a RF available, notified her.

## 2023-12-20 NOTE — Telephone Encounter (Signed)
 Pt called at 10:32a requesting refill of Xanax  to  [id number] [address], [place] - [address],  [place] [address] Phone: [phone]  Fax: [phone]   Next appt  8/27

## 2023-12-29 ENCOUNTER — Telehealth (INDEPENDENT_AMBULATORY_CARE_PROVIDER_SITE_OTHER): Admitting: Adult Health

## 2023-12-29 ENCOUNTER — Encounter: Payer: Self-pay | Admitting: Adult Health

## 2023-12-29 DIAGNOSIS — F411 Generalized anxiety disorder: Secondary | ICD-10-CM

## 2023-12-29 DIAGNOSIS — F428 Other obsessive-compulsive disorder: Secondary | ICD-10-CM | POA: Diagnosis not present

## 2023-12-29 DIAGNOSIS — G4700 Insomnia, unspecified: Secondary | ICD-10-CM

## 2023-12-29 DIAGNOSIS — F331 Major depressive disorder, recurrent, moderate: Secondary | ICD-10-CM

## 2023-12-29 MED ORDER — FLUOXETINE HCL 40 MG PO CAPS: 40.0000 mg | ORAL_CAPSULE | Freq: Every day | ORAL | 5 refills | Status: AC

## 2023-12-29 MED ORDER — TRAZODONE HCL 100 MG PO TABS: ORAL_TABLET | ORAL | 5 refills | Status: AC

## 2023-12-29 MED ORDER — ALPRAZOLAM 1 MG PO TABS: 1.0000 mg | ORAL_TABLET | Freq: Two times a day (BID) | ORAL | 2 refills | Status: DC

## 2023-12-29 NOTE — Progress Notes (Signed)
 Christina [name] [id number] [birth year] 38 y.o.  Virtual Visit via Video Note  I connected with pt @ on 12/29/23 at 11:30 AM EDT by a video enabled telemedicine application and verified that I am speaking with the correct person using two identifiers.   I discussed the limitations of evaluation and management by telemedicine and the availability of in person appointments. The patient expressed understanding and agreed to proceed.  I discussed the assessment and treatment plan with the patient. The patient was provided an opportunity to ask questions and all were answered. The patient agreed with the plan and demonstrated an understanding of the instructions.   The patient was advised to call back or seek an in-person evaluation if the symptoms worsen or if the condition fails to improve as anticipated.  I provided 25 minutes of non-face-to-face time during this encounter.  The patient was located at home.  The provider was located at [facility].   Angeline LOISE Sayers, NP   Subjective:   Patient ID:  Christina [name] is a 38 y.o. (DOB [birth year]) female.  Chief Complaint: No chief complaint on file.   HPI Christina [name] presents for follow-up of GAD, MDD, insomnia, obsessional thoughts and ADHD.  Describes mood today as ok. Pleasant. Reports tearfulness. Mood symptoms - reports depression, anxiety and irritability - more situational. Reports stable interest and motivation. Denies panic attacks - a lot of anxiety attacks. Reports worry, rumination and over thinking. Reports obsessive thoughts, denies acts. Reports health concerns related to liver failure - planning to see a specialist in [place] . Reports mood is variable - health issues. Stating I feel like I'm doing the best I can with the situation I'm in. Taking medications as prescribed.  Energy levels lower. Active, does not have a regular exercise routine. Enjoys some usual interests and activities. Single. Lives alone  with lives with 3 children and dog. Appetite adequate. Weight loss - 225 pounds. Sleeping well at night. Averages 7 to 8 hours during the week and longer on the weekends.  Reports focus and concentration stable. Completing tasks. Managing aspects of household. Working full time. Denies SI or HI.  Denies AH or VH. Denies self harm. Denies substance use.   Review of Systems:  Review of Systems  Musculoskeletal:  Negative for gait problem.  Neurological:  Negative for tremors.  Psychiatric/Behavioral:         Please refer to HPI    Medications: I have reviewed the patient's current medications.  Current Outpatient Medications  Medication Sig Dispense Refill   ALPRAZolam  (XANAX ) 1 MG tablet Take 1 tablet (1 mg total) by mouth 2 (two) times daily. 60 tablet 2   amphetamine -dextroamphetamine  (ADDERALL) 20 MG tablet Take one tablet three times daily. (Patient not taking: Reported on 05/10/2023) 90 tablet 0   amphetamine -dextroamphetamine  (ADDERALL) 20 MG tablet Take 1 tablet (20 mg total) by mouth 2 (two) times daily. (Patient not taking: Reported on 05/10/2023) 60 tablet 0   amphetamine -dextroamphetamine  (ADDERALL) 20 MG tablet Take 1 tablet (20 mg total) by mouth 2 (two) times daily. (Patient not taking: Reported on 05/10/2023) 60 tablet 0   amphetamine -dextroamphetamine  (ADDERALL) 20 MG tablet Take 1 tablet (20 mg total) by mouth 2 (two) times daily. (Patient not taking: Reported on 05/10/2023) 60 tablet 0   FLUoxetine  (PROZAC ) 40 MG capsule Take 1 capsule (40 mg total) by mouth daily. 30 capsule 5   loratadine (CLARITIN) 10 MG tablet Take by mouth. (Patient not taking: Reported on 05/10/2023)  LORazepam  (ATIVAN ) 1 MG tablet TAKE 1 TABLET 3 TIMES A DAY AS NEEDED FOR ANXIETY (Patient not taking: Reported on 05/10/2023) 90 tablet 0   methylphenidate  (RITALIN ) 20 MG tablet Take 1 tablet (20 mg total) by mouth daily. (Patient not taking: Reported on 05/10/2023) 30 tablet 0   metoprolol  tartrate  (LOPRESSOR ) 25 MG tablet TAKE 1 TABLET BY MOUTH TWICE A DAY. MAY TAKE AN EXTRA ONE TABLET AS NEEDED FOR INCREASED HEART RATE. (Patient not taking: Reported on 05/10/2023) 60 tablet 0   Multiple Vitamin (MULTIVITAMIN WITH MINERALS) TABS tablet Take 1 tablet by mouth daily. (Patient not taking: Reported on 05/10/2023)     permethrin  (ELIMITE ) 5 % cream Apply to affected area once 60 g 0   traZODone  (DESYREL ) 100 MG tablet TAKE 1 TO 2 TABLETS BY MOUTH EVERY DAY AT BEDTIME FOR SLEEP 60 tablet 5   No current facility-administered medications for this visit.    Medication Side Effects: None  Allergies:  Allergies  Allergen Reactions   Sulfa Antibiotics Hives, Itching and Swelling   Latex Itching   Polymyxin B     Other reaction(s): eye redness/pain   Polymyxin B-Trimethoprim     Other reaction(s): eye redness/pain    Past Medical History:  Diagnosis Date   Anemia    borderline anemic   Anxiety    Anxiety    Depression    Eczema    GERD (gastroesophageal reflux disease)    with pregnancy   History of hip fracture 10/2010   still has pain, right hip   Hyperemesis arising during pregnancy    Increased heart rate    resting heart rate 100-120   Low energy    on bedrest during pregnancy trying to regain strength   NAFLD (nonalcoholic fatty liver disease)    SVD (spontaneous vaginal delivery) 12/16/2015   SVT (supraventricular tachycardia) (HCC)    SVT (supraventricular tachycardia) (HCC)    UTI (lower urinary tract infection)    Vaginitis and vulvovaginitis     Family History  Problem Relation Age of Onset   Alcohol abuse Mother    Anxiety disorder Mother    Asthma Mother    Mental illness Mother        bipolar   Depression Mother    Hypothyroidism Mother    Ovarian cysts Mother    Hypertension Father    Endometriosis Sister    Ovarian cysts Sister    Alcohol abuse Brother    Crohn's disease Brother    Alcohol abuse Maternal Aunt    Cancer Maternal Aunt        breast    Hypothyroidism Maternal Aunt    Miscarriages / Stillbirths Maternal Aunt        stillbirth   Alcohol abuse Maternal Uncle    Asthma Maternal Grandmother    Mental illness Maternal Grandmother        bipolar   Asthma Maternal Grandfather    Diabetes Maternal Grandfather    Hypertension Maternal Grandfather    Asthma Paternal Grandmother    Hypertension Paternal Grandmother    Asthma Paternal Grandfather    Diabetes Paternal Grandfather    Heart disease Paternal Grandfather    Hypertension Paternal Grandfather     Social History   Socioeconomic History   Marital status: Married    Spouse name: Not on file   Number of children: Not on file   Years of education: Not on file   Highest education level: Not on file  Occupational  History   Not on file  Tobacco Use   Smoking status: Never   Smokeless tobacco: Never  Vaping Use   Vaping status: Never Used  Substance and Sexual Activity   Alcohol use: Not Currently    Comment: occ   Drug use: No   Sexual activity: Yes    Birth control/protection: None  Other Topics Concern   Not on file  Social History Narrative   Not on file   Social Drivers of Health   Financial Resource Strain: Not on file  Food Insecurity: Not on file  Transportation Needs: Not on file  Physical Activity: Not on file  Stress: Not on file  Social Connections: Unknown (09/06/2021)   Received from [facility]   Social Network    Social Network: Not on file  Intimate Partner Violence: Unknown (08/04/2021)   Received from [facility]   HITS    Physically Hurt: Not on file    Insult or Talk Down To: Not on file    Threaten Physical Harm: Not on file    Scream or Curse: Not on file    Past Medical History, Surgical history, Social history, and Family history were reviewed and updated as appropriate.   Please see review of systems for further details on the patient's review from today.   Objective:   Physical Exam:  There were no vitals  taken for this visit.  Physical Exam Constitutional:      General: She is not in acute distress. Musculoskeletal:        General: No deformity.  Neurological:     Mental Status: She is alert and oriented to person, place, and time.     Coordination: Coordination normal.  Psychiatric:        Attention and Perception: Attention and perception normal. She does not perceive auditory or visual hallucinations.        Mood and Affect: Mood normal. Mood is not anxious or depressed. Affect is not labile, blunt, angry or inappropriate.        Speech: Speech normal.        Behavior: Behavior normal.        Thought Content: Thought content normal. Thought content is not paranoid or delusional. Thought content does not include homicidal or suicidal ideation. Thought content does not include homicidal or suicidal plan.        Cognition and Memory: Cognition and memory normal.        Judgment: Judgment normal.     Comments: Insight intact     Lab Review:     Component Value Date/Time   NA 136 08/02/2023 1434   K 4.5 08/02/2023 1434   CL 107 08/02/2023 1434   CO2 17 (L) 08/02/2023 1434   GLUCOSE 88 08/02/2023 1434   BUN 12 08/02/2023 1434   CREATININE 0.59 08/02/2023 1434   CALCIUM 9.1 08/02/2023 1434   PROT 7.0 08/02/2023 1434   ALBUMIN 4.1 08/02/2023 1434   AST 38 08/02/2023 1434   ALT 43 08/02/2023 1434   ALKPHOS 49 08/02/2023 1434   BILITOT 0.8 08/02/2023 1434   GFRNONAA >60 08/02/2023 1434   GFRAA >60 04/07/2019 1834       Component Value Date/Time   WBC 10.2 08/02/2023 1434   RBC 4.35 08/02/2023 1434   HGB 12.4 08/02/2023 1434   HCT 39.0 08/02/2023 1434   PLT 324 08/02/2023 1434   MCV 89.7 08/02/2023 1434   MCH 28.5 08/02/2023 1434   MCHC 31.8 08/02/2023 1434  RDW 13.4 08/02/2023 1434   LYMPHSABS 2.8 08/02/2023 1434   MONOABS 0.9 08/02/2023 1434   EOSABS 0.4 08/02/2023 1434   BASOSABS 0.1 08/02/2023 1434    No results found for: POCLITH, LITHIUM   No results  found for: PHENYTOIN, PHENOBARB, VALPROATE, CBMZ   .res Assessment: Plan:    Plan:  Trazadone 100mg  - taking 1 at hs as needed for sleep Prozac  40mg  daily Xanax  1mg  BID for anxiety/panic  Monitor BP between visits   Reports seizure history  Continue therapy   RTC 6 weeks  25 minutes spent dedicated to the care of this patient on the date of this encounter to include pre-visit review of records, ordering of medication, post visit documentation, and face-to-face time with the patient discussing GAD, MDD, insomnia, obsessional thoughts and ADHD. Discussed continuing current medication regimen.   Discussed potential benefits, risk, and side effects of benzodiazepines to include potential risk of tolerance and dependence, as well as possible drowsiness.  Advised patient not to drive if experiencing drowsiness and to take lowest possible effective dose to minimize risk of dependence and tolerance.  Discussed potential benefits, risks, and side effects of stimulants with patient to include increased heart rate, palpitations, insomnia, increased anxiety, increased irritability, or decreased appetite.  Instructed patient to contact office if experiencing any significant tolerability issues.  Diagnoses and all orders for this visit:  Major depressive disorder, recurrent episode, moderate (HCC)  Generalized anxiety disorder -     ALPRAZolam  (XANAX ) 1 MG tablet; Take 1 tablet (1 mg total) by mouth 2 (two) times daily. -     FLUoxetine  (PROZAC ) 40 MG capsule; Take 1 capsule (40 mg total) by mouth daily.  Obsessional thoughts -     FLUoxetine  (PROZAC ) 40 MG capsule; Take 1 capsule (40 mg total) by mouth daily.  Insomnia, unspecified type -     traZODone  (DESYREL ) 100 MG tablet; TAKE 1 TO 2 TABLETS BY MOUTH EVERY DAY AT BEDTIME FOR SLEEP     Please see After Visit Summary for patient specific instructions.  No future appointments.   No orders of the defined types were placed  in this encounter.     -------------------------------

## 2024-01-30 ENCOUNTER — Encounter (HOSPITAL_BASED_OUTPATIENT_CLINIC_OR_DEPARTMENT_OTHER): Payer: Self-pay | Admitting: Emergency Medicine

## 2024-01-30 ENCOUNTER — Other Ambulatory Visit: Payer: Self-pay

## 2024-01-30 ENCOUNTER — Emergency Department (HOSPITAL_BASED_OUTPATIENT_CLINIC_OR_DEPARTMENT_OTHER)
Admission: EM | Admit: 2024-01-30 | Discharge: 2024-01-30 | Disposition: A | Discharge status: Home / Self Care | Attending: Emergency Medicine | Admitting: Emergency Medicine

## 2024-01-30 DIAGNOSIS — R42 Dizziness and giddiness: Secondary | ICD-10-CM | POA: Insufficient documentation

## 2024-01-30 LAB — COMPREHENSIVE METABOLIC PANEL WITH GFR
ALT: 35 U/L (ref 0–44)
AST: 26 U/L (ref 15–41)
Albumin: 4.3 g/dL (ref 3.5–5.0)
Alkaline Phosphatase: 56 U/L (ref 38–126)
Anion gap: 14 (ref 5–15)
BUN: 19 mg/dL (ref 6–20)
CO2: 21 mmol/L — ABNORMAL LOW (ref 22–32)
Calcium: 9.8 mg/dL (ref 8.9–10.3)
Chloride: 103 mmol/L (ref 98–111)
Creatinine, Ser: 0.52 mg/dL (ref 0.44–1.00)
GFR, Estimated: >60 mL/min (ref >60)
Glucose, Bld: 108 mg/dL — ABNORMAL HIGH (ref 70–99)
Potassium: 3.9 mmol/L (ref 3.5–5.1)
Sodium: 138 mmol/L (ref 135–145)
Total Bilirubin: 0.2 mg/dL (ref 0.0–1.2)
Total Protein: 7.2 g/dL (ref 6.5–8.1)

## 2024-01-30 LAB — CBC
HCT: 35.7 % — ABNORMAL LOW (ref 36.0–46.0)
Hemoglobin: 11.7 g/dL — ABNORMAL LOW (ref 12.0–15.0)
MCH: 29.3 pg (ref 26.0–34.0)
MCHC: 32.8 g/dL (ref 30.0–36.0)
MCV: 89.5 fL (ref 80.0–100.0)
Platelets: 346 K/uL (ref 150–400)
RBC: 3.99 MIL/uL (ref 3.87–5.11)
RDW: 13.1 % (ref 11.5–15.5)
WBC: 11.2 K/uL — ABNORMAL HIGH (ref 4.0–10.5)
nRBC: 0 % (ref 0.0–0.2)

## 2024-01-30 LAB — URINALYSIS, ROUTINE W REFLEX MICROSCOPIC
Bacteria, UA: NONE SEEN
Bilirubin Urine: NEGATIVE
Glucose, UA: NEGATIVE mg/dL
Ketones, ur: 15 mg/dL — AB
Leukocytes,Ua: NEGATIVE
Nitrite: NEGATIVE
Protein, ur: NEGATIVE mg/dL
Specific Gravity, Urine: 1.027 (ref 1.005–1.030)
pH: 6.5 (ref 5.0–8.0)

## 2024-01-30 LAB — RESP PANEL BY RT-PCR (RSV, FLU A&B, COVID)  RVPGX2
Influenza A by PCR: NEGATIVE
Influenza B by PCR: NEGATIVE
Resp Syncytial Virus by PCR: NEGATIVE
SARS Coronavirus 2 by RT PCR: NEGATIVE

## 2024-01-30 LAB — CBG MONITORING, ED: Glucose-Capillary: 116 mg/dL — ABNORMAL HIGH (ref 70–99)

## 2024-01-30 LAB — PREGNANCY, URINE: Preg Test, Ur: NEGATIVE

## 2024-01-30 MED ORDER — MIDODRINE HCL 5 MG PO TABS
5.0000 mg | ORAL_TABLET | Freq: Once | ORAL | Status: AC
  Administered 2024-01-30: 5 mg via ORAL
  Filled 2024-01-30: qty 1

## 2024-01-30 MED ORDER — MIDODRINE HCL 5 MG PO TABS: 5.0000 mg | ORAL_TABLET | Freq: Three times a day (TID) | ORAL | 0 refills | Status: AC | PRN

## 2024-01-30 NOTE — ED Notes (Signed)
 Pt ambulated to the restroom without assistance - denies dizziness.

## 2024-01-30 NOTE — ED Notes (Signed)
 Pt provided crackers and water - Ok'd by EDP

## 2024-01-30 NOTE — ED Provider Notes (Signed)
 [place] EMERGENCY DEPARTMENT AT [facility] Provider Note   CSN: [id number] Arrival date & time: 01/30/24  1509     Patient presents with: Hypotension   Christina [name] is a 38 y.o. female with history of liver disease, nonfatty, presented to ED with concern for low blood pressure and dizziness.  Patient reports she has felt lightheaded more so for the past 24 hours, particularly a strain sensation in the front of her forehead, without headache or loss of vision.  She says sometimes she seems to have tunnel vision.  This is not associated with standing or movement.  She does work in a health at work setting and was concerned she may have been exposed to COVID.  She denies any other recent infectious or fever type symptoms.  She reports her typical blood pressure is extremely normal when she checks it at home, 120 systolic.  In the past she did have gestational hypertension and was on metoprolol  but has been taken off of it over 10 years ago.  He does not take any other blood pressure medicine or any new prescribed medications.  She drinks 64 ounces of water at a minimum daily, and tries to increase her salt intake when her blood pressure is low.  She does see cardiology for an SVT once annually.  She reports she checked her blood pressure at home when she was feeling dizzy and it was 80/60   HPI     Prior to Admission medications   Medication Sig Start Date End Date Taking? Authorizing Provider  midodrine (PROAMATINE) 5 MG tablet Take 1 tablet (5 mg total) by mouth every 8 (eight) hours as needed for up to 21 doses. For systolic blood pressure under 85 mmhg on 2 repeat checks 01/30/24  Yes Caralina Nop, Donnice PARAS, MD  ALPRAZolam  (XANAX ) 1 MG tablet Take 1 tablet (1 mg total) by mouth 2 (two) times daily. 12/29/23   Mozingo, Regina Nattalie, NP  amphetamine -dextroamphetamine  (ADDERALL) 20 MG tablet Take one tablet three times daily. Patient not taking: Reported on 05/10/2023  11/14/19   Mozingo, Regina Nattalie, NP  amphetamine -dextroamphetamine  (ADDERALL) 20 MG tablet Take 1 tablet (20 mg total) by mouth 2 (two) times daily. Patient not taking: Reported on 05/10/2023 07/28/22   Mozingo, Regina Nattalie, NP  amphetamine -dextroamphetamine  (ADDERALL) 20 MG tablet Take 1 tablet (20 mg total) by mouth 2 (two) times daily. Patient not taking: Reported on 05/10/2023 08/25/22   Mozingo, Regina Nattalie, NP  amphetamine -dextroamphetamine  (ADDERALL) 20 MG tablet Take 1 tablet (20 mg total) by mouth 2 (two) times daily. Patient not taking: Reported on 05/10/2023 09/22/22   Mozingo, Regina Nattalie, NP  FLUoxetine  (PROZAC ) 40 MG capsule Take 1 capsule (40 mg total) by mouth daily. 12/29/23   Mozingo, Regina Nattalie, NP  loratadine (CLARITIN) 10 MG tablet Take by mouth. Patient not taking: Reported on 05/10/2023    [provider]  LORazepam  (ATIVAN ) 1 MG tablet TAKE 1 TABLET 3 TIMES A DAY AS NEEDED FOR ANXIETY Patient not taking: Reported on 05/10/2023 03/02/23   Mozingo, Regina Nattalie, NP  methylphenidate  (RITALIN ) 20 MG tablet Take 1 tablet (20 mg total) by mouth daily. Patient not taking: Reported on 05/10/2023 02/16/23   Mozingo, Regina Nattalie, NP  metoprolol  tartrate (LOPRESSOR ) 25 MG tablet TAKE 1 TABLET BY MOUTH TWICE A DAY. MAY TAKE AN EXTRA ONE TABLET AS NEEDED FOR INCREASED HEART RATE. Patient not taking: Reported on 05/10/2023 12/15/22   Waddell Danelle LELON, MD  Multiple Vitamin (MULTIVITAMIN  WITH MINERALS) TABS tablet Take 1 tablet by mouth daily. Patient not taking: Reported on 05/10/2023    [provider]  permethrin  (ELIMITE ) 5 % cream Apply to affected area once 08/10/23   Gilliam, Jonathan C, PA  traZODone  (DESYREL ) 100 MG tablet TAKE 1 TO 2 TABLETS BY MOUTH EVERY DAY AT BEDTIME FOR SLEEP 12/29/23   Mozingo, Regina Nattalie, NP    Allergies: Sulfa antibiotics, Latex, Polymyxin b, and Polymyxin b-trimethoprim    Review of Systems  Updated Vital Signs BP 109/67    Pulse 79   Temp 98.5 F (36.9 C)   Resp 16   Ht 5' 7 (1.702 m)   Wt 110.2 kg   LMP 01/20/2024 (Approximate)   SpO2 95%   BMI 38.06 kg/m   Physical Exam Constitutional:      General: She is not in acute distress. HENT:     Head: Normocephalic and atraumatic.  Eyes:     Conjunctiva/sclera: Conjunctivae normal.     Pupils: Pupils are equal, round, and reactive to light.  Cardiovascular:     Rate and Rhythm: Normal rate and regular rhythm.  Pulmonary:     Effort: Pulmonary effort is normal. No respiratory distress.  Abdominal:     General: There is no distension.     Tenderness: There is no abdominal tenderness.  Skin:    General: Skin is warm and dry.  Neurological:     General: No focal deficit present.     Mental Status: She is alert and oriented to person, place, and time. Mental status is at baseline.  Psychiatric:        Mood and Affect: Mood normal.        Behavior: Behavior normal.     (all labs ordered are listed, but only abnormal results are displayed) Labs Reviewed  COMPREHENSIVE METABOLIC PANEL WITH GFR - Abnormal; Notable for the following components:      Result Value   CO2 21 (*)    Glucose, Bld 108 (*)    All other components within normal limits  CBC - Abnormal; Notable for the following components:   WBC 11.2 (*)    Hemoglobin 11.7 (*)    HCT 35.7 (*)    All other components within normal limits  URINALYSIS, ROUTINE W REFLEX MICROSCOPIC - Abnormal; Notable for the following components:   Hgb urine dipstick TRACE (*)    Ketones, ur 15 (*)    All other components within normal limits  CBG MONITORING, ED - Abnormal; Notable for the following components:   Glucose-Capillary 116 (*)    All other components within normal limits  RESP PANEL BY RT-PCR (RSV, FLU A&B, COVID)  RVPGX2  PREGNANCY, URINE    EKG: EKG Interpretation Date/Time:  Sunday January 30 2024 15:33:25 EDT Ventricular Rate:  94 PR Interval:  142 QRS Duration:  84 QT  Interval:  370 QTC Calculation: 462 R Axis:   44  Text Interpretation: Normal sinus rhythm Normal ECG When compared with ECG of 20-Sep-2022 07:47, No significant change was found Confirmed by Cottie Cough [phone]) on 01/30/2024 5:39:22 PM  Radiology: No results found.   Procedures   Medications Ordered in the ED  midodrine (PROAMATINE) tablet 5 mg (5 mg Oral Given 01/30/24 1909)    Clinical Course as of 01/30/24 2117  Sun Jan 30, 2024  1946 Ortho VS unremarkable [MT]    Clinical Course User Index [MT] Cottie Cough PARAS, MD  Medical Decision Making Amount and/or Complexity of Data Reviewed Labs: ordered.  Risk Prescription drug management.   This patient presents to the ED with concern for low blood pressure, dizziness. This involves an extensive number of treatment options, and is a complaint that carries with it a high risk of complications and morbidity.  The differential diagnosis includes orthostatic hypotension versus POTS versus arrhythmia versus dehydration versus anemia versus electrolyte derangement versus viral illness including COVID-19 infection versus hypoglycemia versus other  Co-morbidities that complicate the patient evaluation: Liver disease  Additional history obtained from the patient's fianc at bedside   I ordered and personally interpreted labs.  The pertinent results include: Glucose normal.  No significant acute anemia.  UA without sign of infection.  COVID and flu negative   The patient was maintained on a cardiac monitor.  I personally viewed and interpreted the cardiac monitored which showed an underlying rhythm of: Sinus rhythm  Per my interpretation the patient's ECG shows sinus rhythm no acute ischemic findings  I ordered medication including midodrine for borderline hypotension  I have reviewed the patients home medicines and have made adjustments as needed  Test Considered: Low suspicion for acute PE,  stroke, meningitis  After the interventions noted above, I reevaluated the patient and found that they have: stayed the same  The patient has not had any significant labile swings in her blood pressure and orthostatic vital signs have been negative.  There is no clear evidence of dehydration on her workup.  Have a low suspicion for acute PE, ACS, sepsis, or other life-threatening medical condition.  I question whether she may have POTS, as there does appear to be some postural component to her symptoms, although no significant tachycardia.  I would recommend that she follow-up with her cardiologist again.  I did prescribe her midodrine to use as needed at home if her blood pressure truly drops as low as she had reported, 80/60 this morning.  She drinks a regular amount of salt in her diet and drinks, I believe, an appropriate amount of water to stay hydrated.  It is also possible this is yet another type of viral syndrome.  I recommended conservative care and watchful waiting for the next several days at home, but also following up with her PCP in the office.  She and her fiance verbalized understanding.  Hospitalization was considered and discussed with the patient, and offered to the patient, given that she continues to complain of some lightheadedness.  However, in shared decision making the patient has opted to go home with outpatient follow-up, which I think is reasonable.  Disposition:  After consideration of the diagnostic results and the patient's response to treatment, I feel that the patient would benefit from close outpatient follow-up      Final diagnoses:  Lightheadedness    ED Discharge Orders          Ordered    midodrine (PROAMATINE) 5 MG tablet  Every 8 hours PRN        01/30/24 2054               Cottie Donnice PARAS, MD 01/30/24 2117

## 2024-01-30 NOTE — ED Notes (Signed)
 Reviewed D/C information with the patient, pt verbalized understanding. No additional concerns at this time.

## 2024-01-30 NOTE — ED Triage Notes (Signed)
 Pt via POV c/o hypotension with reading 80/45 at home and associated dizziness. Pt states that she has had difficulty regulating her pressure recently. Pt does not take BP meds. A/O x 4. No pain or SOB.

## 2024-02-01 ENCOUNTER — Telehealth: Payer: Self-pay

## 2024-02-01 NOTE — Telephone Encounter (Signed)
 Called to offer appt with Dr. Okey for Thursday 02/03/24 at 8:40 AM for ED F/U. Left message to callback.

## 2024-02-01 NOTE — Telephone Encounter (Signed)
-----   Message from [place] sent at 01/31/2024  1:58 PM EDT ----- Regarding: FW: ED Patient follow up Pt last seen in cardiology in 2023 by KANDICE Birmingham   I saw her in 2017    Please schedule follow up    She was just seen in ER ----- Message ----- From: Cottie Donnice PARAS, MD Sent: 01/30/2024   8:56 PM EDT To: Vina Okey GAILS, MD Subject: ED Patient follow up                           Hi Dr. Okey,  I saw this patient in the [facility] emergency department today.  She is followed in your office as well as with the EP for SVT.  She is here today with lightheadedness and complaining of labile, low blood pressures at home, to 80/60.    Blood pressures were softer but still within normal limits in the ED.    I recommended that she follow-up with your office - unsure if this may be POTS - this seems to be a fairly new issue for her.  I gave her PRN midodrine to use for low BP at home.  Thank you,  [employer]

## 2024-02-02 ENCOUNTER — Other Ambulatory Visit: Payer: Self-pay | Admitting: Medical Genetics

## 2024-02-10 ENCOUNTER — Other Ambulatory Visit
Admission: RE | Admit: 2024-02-10 | Discharge: 2024-02-10 | Disposition: A | Discharge status: Home / Self Care | Payer: Self-pay | Source: Ambulatory Visit | Attending: Medical Genetics | Admitting: Medical Genetics

## 2024-02-13 NOTE — Progress Notes (Deleted)
  Cardiology Office Note:  .   Date:  02/13/2024  ID:  Christina [name], DOB 15-Sep-1985, MRN [id number] PCP: Okey Carlin Redbird, MD  [facility] Providers Cardiologist:  None Electrophysiologist:  Danelle Birmingham, MD {  History of Present Illness: .   Christina [name] is a 38 y.o. female w/PMHx of  ADHD, anxiety/depression Suspect (though not documented) SVT ?Autonomic dysfunction  She was last seen 09/23/21 by Dr. Birmingham, reported she had stopped exercising /2 muscle aches/pains, palpitations, rapids HR ~ 3-4x/mo Encouraged adequate hydration, salt intake.  Today's visit is scheduled as an overdue annual visit ROS:   *** symptoms *** 5 kids!     Studies Reviewed: SABRA    EKG done today and reviewed by myself:  ***  12/31/16: EST Blood pressure demonstrated a normal response to exercise. There was no ST segment deviation noted during stress. No T wave inversion was noted during stress. Negative, adequate stress test.   Aug 2018,  2 week monitor 1. NSR with sinus tachycardia 2. Rare PVC's and PAC's. 3. No non-sustained or sustained atrial or ventricular arrhythmias 4. No prolonged pauses   09/13/15: TTE Study Conclusions - Left ventricle: The cavity size was normal. Wall thickness was   normal. Systolic function was normal. The estimated ejection   fraction was in the range of 55% to 60%. Wall motion was normal;   there were no regional wall motion abnormalities. Left   ventricular diastolic function parameters were normal.   09/13/15: event monitor Sinus rhythm, sinus tachycardia with occasional PACs, short burst PAT Palpitations correlated with skips from upper chamber (PACs, short burst  PAT) Chest pressure did not correlate with arrhythmia     Risk Assessment/Calculations:    Physical Exam:   VS:  LMP 01/20/2024 (Approximate)    Wt Readings from Last 3 Encounters:  01/30/24 243 lb (110.2 kg)  08/02/23 225 lb (102.1 kg)  05/10/23 239 lb 9.6 oz (108.7 kg)     GEN: Well nourished, well developed in no acute distress NECK: No JVD; No carotid bruits CARDIAC: ***RRR, no murmurs, rubs, gallops RESPIRATORY:  *** CTA b/l without rales, wheezing or rhonchi  ABDOMEN: Soft, non-tender, non-distended EXTREMITIES: *** No edema; No deformity   ASSESSMENT AND PLAN: .    Autonomic dysfunction palpitations Suspect SVT Noted w/ST  ***     {Are you ordering a CV Procedure (e.g. stress test, cath, DCCV, TEE, etc)?   Press F2        :[id number]}     Dispo: ***  Signed, Charlies Macario Arthur, PA-C

## 2024-02-14 ENCOUNTER — Ambulatory Visit: Attending: Physician Assistant | Admitting: Physician Assistant

## 2024-02-15 ENCOUNTER — Encounter: Payer: Self-pay | Admitting: Physician Assistant

## 2024-02-21 LAB — GENECONNECT MOLECULAR SCREEN: Genetic Analysis Overall Interpretation: NEGATIVE

## 2024-02-26 ENCOUNTER — Other Ambulatory Visit: Payer: Self-pay

## 2024-02-26 ENCOUNTER — Emergency Department (HOSPITAL_BASED_OUTPATIENT_CLINIC_OR_DEPARTMENT_OTHER)

## 2024-02-26 ENCOUNTER — Encounter (HOSPITAL_BASED_OUTPATIENT_CLINIC_OR_DEPARTMENT_OTHER): Payer: Self-pay

## 2024-02-26 ENCOUNTER — Emergency Department (HOSPITAL_BASED_OUTPATIENT_CLINIC_OR_DEPARTMENT_OTHER)
Admission: EM | Admit: 2024-02-26 | Discharge: 2024-02-26 | Disposition: A | Discharge status: Home / Self Care | Attending: Emergency Medicine | Admitting: Emergency Medicine

## 2024-02-26 DIAGNOSIS — R1011 Right upper quadrant pain: Secondary | ICD-10-CM | POA: Insufficient documentation

## 2024-02-26 DIAGNOSIS — Z91040 Latex allergy status: Secondary | ICD-10-CM | POA: Diagnosis not present

## 2024-02-26 DIAGNOSIS — R109 Unspecified abdominal pain: Secondary | ICD-10-CM

## 2024-02-26 LAB — COMPREHENSIVE METABOLIC PANEL WITH GFR
ALT: 29 U/L (ref 0–44)
AST: 19 U/L (ref 15–41)
Albumin: 4.3 g/dL (ref 3.5–5.0)
Alkaline Phosphatase: 61 U/L (ref 38–126)
Anion gap: 8 (ref 5–15)
BUN: 15 mg/dL (ref 6–20)
CO2: 25 mmol/L (ref 22–32)
Calcium: 9.6 mg/dL (ref 8.9–10.3)
Chloride: 103 mmol/L (ref 98–111)
Creatinine, Ser: 0.7 mg/dL (ref 0.44–1.00)
GFR, Estimated: >60 mL/min (ref >60)
Glucose, Bld: 84 mg/dL (ref 70–99)
Potassium: 3.9 mmol/L (ref 3.5–5.1)
Sodium: 136 mmol/L (ref 135–145)
Total Bilirubin: 0.2 mg/dL (ref 0.0–1.2)
Total Protein: 6.9 g/dL (ref 6.5–8.1)

## 2024-02-26 LAB — URINALYSIS, ROUTINE W REFLEX MICROSCOPIC
Bacteria, UA: NONE SEEN
Bilirubin Urine: NEGATIVE
Glucose, UA: NEGATIVE mg/dL
Ketones, ur: NEGATIVE mg/dL
Leukocytes,Ua: NEGATIVE
Nitrite: NEGATIVE
Protein, ur: NEGATIVE mg/dL
Specific Gravity, Urine: 1.022 (ref 1.005–1.030)
pH: 6.5 (ref 5.0–8.0)

## 2024-02-26 LAB — CBC WITH DIFFERENTIAL/PLATELET
Abs Immature Granulocytes: 0.03 K/uL (ref 0.00–0.07)
Basophils Absolute: 0.1 K/uL (ref 0.0–0.1)
Basophils Relative: 1 %
Eosinophils Absolute: 0.3 K/uL (ref 0.0–0.5)
Eosinophils Relative: 3 %
HCT: 34.9 % — ABNORMAL LOW (ref 36.0–46.0)
Hemoglobin: 11.3 g/dL — ABNORMAL LOW (ref 12.0–15.0)
Immature Granulocytes: 0 %
Lymphocytes Relative: 31 %
Lymphs Abs: 2.7 K/uL (ref 0.7–4.0)
MCH: 29.1 pg (ref 26.0–34.0)
MCHC: 32.4 g/dL (ref 30.0–36.0)
MCV: 89.9 fL (ref 80.0–100.0)
Monocytes Absolute: 0.9 K/uL (ref 0.1–1.0)
Monocytes Relative: 11 %
Neutro Abs: 4.8 K/uL (ref 1.7–7.7)
Neutrophils Relative %: 54 %
Platelets: 334 K/uL (ref 150–400)
RBC: 3.88 MIL/uL (ref 3.87–5.11)
RDW: 13.2 % (ref 11.5–15.5)
WBC: 8.8 K/uL (ref 4.0–10.5)
nRBC: 0 % (ref 0.0–0.2)

## 2024-02-26 LAB — LIPASE, BLOOD: Lipase: 34 U/L (ref 11–51)

## 2024-02-26 LAB — PREGNANCY, URINE: Preg Test, Ur: NEGATIVE

## 2024-02-26 MED ORDER — IOHEXOL 300 MG/ML  SOLN
100.0000 mL | Freq: Once | INTRAMUSCULAR | Status: AC | PRN
  Administered 2024-02-26: 100 mL via INTRAVENOUS

## 2024-02-26 MED ORDER — ONDANSETRON HCL 4 MG PO TABS: 4.0000 mg | ORAL_TABLET | Freq: Four times a day (QID) | ORAL | 0 refills | Status: AC

## 2024-02-26 MED ORDER — KETOROLAC TROMETHAMINE 15 MG/ML IJ SOLN
15.0000 mg | Freq: Once | INTRAMUSCULAR | Status: AC
  Administered 2024-02-26: 15 mg via INTRAVENOUS
  Filled 2024-02-26: qty 1

## 2024-02-26 NOTE — ED Notes (Signed)
 Reviewed AVS/discharge instruction with patient. Time allotted for and all questions answered. Patient is agreeable for d/c and escorted to ed exit by staff.

## 2024-02-26 NOTE — Discharge Instructions (Signed)
 Evaluation today was reassuring.  As we discussed please follow-up with your PCP.  CT was nonacute and did demonstrate decreased hepatic steatosis.  Sent Zofran  to your pharmacy.  If your abdominal pain worsens or changes, you develop a fever, persistent nausea and vomiting or inability to tolerate fluid intake or any other concerning symptom please return to the ED for further evaluation.

## 2024-02-26 NOTE — ED Triage Notes (Signed)
 Arrives ambulatory to the ED with complaints of right side abdominal pain (7/10) x2 days. Patient also reports an ongoing issue of chronic liver disease that she consults a specialist in [place]  for.

## 2024-02-26 NOTE — ED Provider Notes (Signed)
 [place] EMERGENCY DEPARTMENT AT [facility] Provider Note   CSN: [id number] Arrival date & time: 02/26/24  1525     Patient presents with: Abdominal Pain   Christina [name] is a 38 y.o. female.   38 year old female presenting with right upper quadrant pain.  Patient notes a history of fatty liver disease, reports that she had similar symptoms when she was diagnosed with this previously.  Describes pain as sharp and constant, pain began early this morning and is not associated with fever/nausea/vomiting/diarrhea/chest pain/shortness of breath. History of cholecystectomy as well as tubal ligation, no other abdominal surgeries.  She sees a liver specialist at [facility] in [place] , where she has undergone extensive work-up in regard to her fatty liver disease and is scheduled to see them next month. She has not taken any medications for relief of her symptoms.    Abdominal Pain      Prior to Admission medications   Medication Sig Start Date End Date Taking? Authorizing Provider  ALPRAZolam  (XANAX ) 1 MG tablet Take 1 tablet (1 mg total) by mouth 2 (two) times daily. 12/29/23   Mozingo, Regina Nattalie, NP  amphetamine -dextroamphetamine  (ADDERALL) 20 MG tablet Take one tablet three times daily. Patient not taking: Reported on 05/10/2023 11/14/19   Mozingo, Regina Nattalie, NP  amphetamine -dextroamphetamine  (ADDERALL) 20 MG tablet Take 1 tablet (20 mg total) by mouth 2 (two) times daily. Patient not taking: Reported on 05/10/2023 07/28/22   Mozingo, Regina Nattalie, NP  amphetamine -dextroamphetamine  (ADDERALL) 20 MG tablet Take 1 tablet (20 mg total) by mouth 2 (two) times daily. Patient not taking: Reported on 05/10/2023 08/25/22   Mozingo, Regina Nattalie, NP  amphetamine -dextroamphetamine  (ADDERALL) 20 MG tablet Take 1 tablet (20 mg total) by mouth 2 (two) times daily. Patient not taking: Reported on 05/10/2023 09/22/22   Mozingo, Regina Nattalie, NP  FLUoxetine  (PROZAC )  40 MG capsule Take 1 capsule (40 mg total) by mouth daily. 12/29/23   Mozingo, Regina Nattalie, NP  loratadine (CLARITIN) 10 MG tablet Take by mouth. Patient not taking: Reported on 05/10/2023    [provider]  LORazepam  (ATIVAN ) 1 MG tablet TAKE 1 TABLET 3 TIMES A DAY AS NEEDED FOR ANXIETY Patient not taking: Reported on 05/10/2023 03/02/23   Mozingo, Regina Nattalie, NP  methylphenidate  (RITALIN ) 20 MG tablet Take 1 tablet (20 mg total) by mouth daily. Patient not taking: Reported on 05/10/2023 02/16/23   Mozingo, Regina Nattalie, NP  metoprolol  tartrate (LOPRESSOR ) 25 MG tablet TAKE 1 TABLET BY MOUTH TWICE A DAY. MAY TAKE AN EXTRA ONE TABLET AS NEEDED FOR INCREASED HEART RATE. Patient not taking: Reported on 05/10/2023 12/15/22   Waddell Danelle LELON, MD  midodrine (PROAMATINE) 5 MG tablet Take 1 tablet (5 mg total) by mouth every 8 (eight) hours as needed for up to 21 doses. For systolic blood pressure under 85 mmhg on 2 repeat checks 01/30/24   Trifan, Donnice PARAS, MD  Multiple Vitamin (MULTIVITAMIN WITH MINERALS) TABS tablet Take 1 tablet by mouth daily. Patient not taking: Reported on 05/10/2023    [provider]  permethrin  (ELIMITE ) 5 % cream Apply to affected area once 08/10/23   Myriam Dorn BROCKS, PA  traZODone  (DESYREL ) 100 MG tablet TAKE 1 TO 2 TABLETS BY MOUTH EVERY DAY AT BEDTIME FOR SLEEP 12/29/23   Mozingo, Regina Nattalie, NP    Allergies: Sulfa antibiotics, Latex, Polymyxin b, and Polymyxin b-trimethoprim    Review of Systems  Gastrointestinal:  Positive for abdominal pain.  Updated Vital Signs  Vitals:   02/26/24 1602 02/26/24 1620 02/26/24 1630 02/26/24 1645  BP:  (!) 113/55 (!) 114/51 106/64  Pulse:  82 85 82  Resp:      Temp:      TempSrc:      SpO2:  98% 98% 96%  Weight: 110.2 kg     Height: 5' 7 (1.702 m)        Physical Exam Vitals and nursing note reviewed.  HENT:     Head: Normocephalic.  Eyes:     Extraocular Movements: Extraocular movements  intact.  Cardiovascular:     Rate and Rhythm: Normal rate and regular rhythm.  Pulmonary:     Effort: Pulmonary effort is normal.     Breath sounds: Normal breath sounds.  Abdominal:     Palpations: Abdomen is soft.     Tenderness: There is abdominal tenderness. There is no guarding.     Comments: Mild TTP in the RUQ  Musculoskeletal:     Cervical back: Normal range of motion.     Comments: Moves all extremities spontaneously without difficulty  Skin:    General: Skin is warm and dry.  Neurological:     Mental Status: She is alert and oriented to person, place, and time.     (all labs ordered are listed, but only abnormal results are displayed) Labs Reviewed  CBC WITH DIFFERENTIAL/PLATELET - Abnormal; Notable for the following components:      Result Value   Hemoglobin 11.3 (*)    HCT 34.9 (*)    All other components within normal limits  URINALYSIS, ROUTINE W REFLEX MICROSCOPIC - Abnormal; Notable for the following components:   Hgb urine dipstick MODERATE (*)    All other components within normal limits  COMPREHENSIVE METABOLIC PANEL WITH GFR  LIPASE, BLOOD  PREGNANCY, URINE    EKG: None  Radiology: No results found.   Procedures   Medications Ordered in the ED - No data to display  Clinical Course as of 02/26/24 1851  Sat Feb 26, 2024  1846 RUQ abdominal pain (s/p cholecystectomy). CT scan pending. No CP or shob. PERC neg. Anticipate dc.  [JR]    Clinical Course User Index [JR] Lang Norleen POUR, PA-C                                 Medical Decision Making This patient presents to the ED for concern of RUQ pain, this involves an extensive number of treatment options, and is a complaint that carries with it a high risk of complications and morbidity.  The differential diagnosis includes choledocholithiasis, cholangitis, diverticulitis, musculoskeletal pain/strain/sprain.   Co morbidities that complicate the patient evaluation  History of fatty liver  disease and hepatic hemangioma, history of cholecystectomy   Additional history obtained:  Additional history obtained from record review External records from outside source obtained and reviewed including prior ED notes   Lab Tests:  I Ordered, and personally interpreted labs.  The pertinent results include:  CBC notable for hemoglobin of 11.3, this is largely consistent with her most recent baseline of 11.7; no leukocytosis. CMP within normal limits.  Lipase within normal limits.  Urinalysis notable for moderate RBCs, urine hCG negative.   Imaging Studies ordered:  I ordered imaging studies including CT abdomen/pelvis  CT imaging pending at time of shift change   Cardiac Monitoring: / EKG:  The patient was maintained on a cardiac monitor.  I personally viewed and interpreted the cardiac monitored which showed an underlying rhythm of: NSR   Social Determinants of Health:  Depression    Test / Admission - Considered:  Physical exam is notable as above.  Patient has abdominal tenderness to palpation in the right upper quadrant only, no guarding/rigidity. PERC negative.  Patient with history of fatty liver disease, reports pain that she is experiencing today is similar to pain she experienced at time of her diagnosis, although I have a low suspicion that fatty liver disease/hepatic hemangioma is causing her discomfort today as this is typically asymptomatic, we will proceed with basic labs and reassess.  Patient prefers to avoid analgesics for now until root cause of her pain can be determined. Labs are largely unremarkable as above, patient remains afebrile and without tachycardia. At time of my reassessment, I reviewed the patient's lab results in depth with her, I advised that to further assess the root cause of her pain today we could proceed with ultrasound/CT imaging, I discussed with her that CT imaging will provide a more in-depth view, but that ultrasound can be utilized  if she wishes to avoid radiation exposure.  She would like to proceed with CT imaging, which I feel is a reasonable plan.  She has rolled up a blanket and placed it under her right flank, which has provided some relief of her pain as long as she does not move.  She continues to deny any analgesics for alleviation of her pain.  Patient handed off to PA-C Norleen Essex pending results of CT scan, see their note for additional information regarding assessment/plan/dispo  Amount and/or Complexity of Data Reviewed Labs: ordered. Radiology: ordered.        Final diagnoses:  None    ED Discharge Orders     None          Glendia Rocky SAILOR, [place] 02/26/24 1854    Lenor Hollering, MD 02/26/24 1944

## 2024-02-26 NOTE — ED Provider Notes (Signed)
 Patient signed out to me by PA Rocky Hamilton.  Here for right upper quadrant abdominal pain.  History of prior cholecystectomy and fatty liver disease.  CT is pending.  Workup thus far is reassuring. Physical Exam  BP 106/76   Pulse 78   Temp 97.6 F (36.4 C) (Oral)   Resp 15   Ht 5' 7 (1.702 m)   Wt 110.2 kg   LMP 01/20/2024 (Approximate) Comment: Tubal Ligation  SpO2 100%   BMI 38.05 kg/m   Physical Exam  Procedures  Procedures  ED Course / MDM   Clinical Course as of 02/26/24 2259  Sat Feb 26, 2024  1846 RUQ abdominal pain (s/p cholecystectomy). CT scan pending. No CP or shob. PERC neg. Anticipate dc.  [JR]    Clinical Course User Index [JR] Lang Norleen POUR, PA-C   Medical Decision Making Amount and/or Complexity of Data Reviewed Labs: ordered. Radiology: ordered.  Risk Prescription drug management.    CT is nonacute also demonstrated decreased hepatic steatosis.  On reassessment she was feeling well and symptoms had improved.  Sent Zofran  to her pharmacy.  Fluid challenge with no concern.  Discussed return precautions.  Advised her to follow-up with her PCP.  Discharged.      Lang Norleen POUR, PA-C 02/26/24 LEAFY    Lenor Hollering, MD 02/26/24 [phone]

## 2024-03-07 ENCOUNTER — Telehealth: Payer: Self-pay | Admitting: Adult Health

## 2024-03-07 NOTE — Telephone Encounter (Signed)
 Pt was prescribed Xanax  a year ago, had been taking lorazepam . She was taking Xanax  at the same dosing as lorazepam , but it is not the same.  Said she is almost out of Xanax , has been taking 3-4 tablets/day. She is due for FU now and have asked admin to schedule an appt.

## 2024-03-07 NOTE — Telephone Encounter (Signed)
 Christina [name] called to report that she is taking Xanax  and almost out of it. She noticed the instructions on the bottle are different than what she is taking. Please call to discuss how to take it and how to get refilled.

## 2024-03-07 NOTE — Telephone Encounter (Signed)
 Please call to schedule FU

## 2024-03-08 ENCOUNTER — Other Ambulatory Visit: Payer: Self-pay | Admitting: Adult Health

## 2024-03-08 ENCOUNTER — Telehealth (INDEPENDENT_AMBULATORY_CARE_PROVIDER_SITE_OTHER): Admitting: Adult Health

## 2024-03-08 ENCOUNTER — Encounter: Payer: Self-pay | Admitting: Adult Health

## 2024-03-08 DIAGNOSIS — F331 Major depressive disorder, recurrent, moderate: Secondary | ICD-10-CM

## 2024-03-08 DIAGNOSIS — F411 Generalized anxiety disorder: Secondary | ICD-10-CM

## 2024-03-08 DIAGNOSIS — F428 Other obsessive-compulsive disorder: Secondary | ICD-10-CM | POA: Diagnosis not present

## 2024-03-08 DIAGNOSIS — F909 Attention-deficit hyperactivity disorder, unspecified type: Secondary | ICD-10-CM

## 2024-03-08 DIAGNOSIS — G4700 Insomnia, unspecified: Secondary | ICD-10-CM

## 2024-03-08 MED ORDER — ALPRAZOLAM 1 MG PO TABS: 1.0000 mg | ORAL_TABLET | Freq: Two times a day (BID) | ORAL | 2 refills | Status: AC

## 2024-03-08 NOTE — Telephone Encounter (Signed)
 Christina [name] is asking to schedule patient today for FU.

## 2024-03-08 NOTE — Telephone Encounter (Signed)
Pt has an appt today at 4 pm.

## 2024-03-08 NOTE — Progress Notes (Signed)
 Christina [name] [id number] [birth year] 38 y.o.  Virtual Visit via Video Note  I connected with pt @ on 03/08/24 at  4:00 PM EST by a video enabled telemedicine application and verified that I am speaking with the correct person using two identifiers.   I discussed the limitations of evaluation and management by telemedicine and the availability of in person appointments. The patient expressed understanding and agreed to proceed.  I discussed the assessment and treatment plan with the patient. The patient was provided an opportunity to ask questions and all were answered. The patient agreed with the plan and demonstrated an understanding of the instructions.   The patient was advised to call back or seek an in-person evaluation if the symptoms worsen or if the condition fails to improve as anticipated.  I provided 25 minutes of non-face-to-face time during this encounter.  The patient was located at home.  The provider was located at [facility].   Angeline LOISE Sayers, NP   Subjective:   Patient ID:  Christina [name] is a 38 y.o. (DOB 1985/09/20) female.  Chief Complaint: No chief complaint on file.   HPI Christina [name] presents for follow-up of GAD, MDD, insomnia, obsessional thoughts and ADHD.  Describes mood today as ok. Pleasant. Reports tearfulness at times. Mood symptoms - reports situational depression, anxiety and irritability. Reports stable interest and motivation. Reports panic attacks. Reports worry, rumination and over thinking. Reports obsessive thoughts, denies acts. Reports health concerns - liver. Reports concerns about daughter's upcoming surgery. Reports mood is variable. Stating I feel like I'm doing ok. Taking medications as prescribed.  Energy levels vary. Active, does not have a regular exercise routine. Enjoys some usual interests and activities. Single. Lives alone with with 3 children and dog. Appetite adequate. Weight loss - 225 pounds. Sleeping  well at night. Averages 7 to 8 hours during the week and longer on the weekends.  Reports focus and concentration stable. Completing tasks. Managing aspects of household. Working full time. Denies SI or HI.  Denies AH or VH. Denies self harm. Denies substance use.   Review of Systems:  Review of Systems  Musculoskeletal:  Negative for gait problem.  Neurological:  Negative for tremors.  Psychiatric/Behavioral:         Please refer to HPI    Medications: I have reviewed the patient's current medications.  Current Outpatient Medications  Medication Sig Dispense Refill   ALPRAZolam  (XANAX ) 1 MG tablet Take 1 tablet (1 mg total) by mouth 2 (two) times daily. 60 tablet 2   amphetamine -dextroamphetamine  (ADDERALL) 20 MG tablet Take one tablet three times daily. (Patient not taking: Reported on 05/10/2023) 90 tablet 0   amphetamine -dextroamphetamine  (ADDERALL) 20 MG tablet Take 1 tablet (20 mg total) by mouth 2 (two) times daily. (Patient not taking: Reported on 05/10/2023) 60 tablet 0   amphetamine -dextroamphetamine  (ADDERALL) 20 MG tablet Take 1 tablet (20 mg total) by mouth 2 (two) times daily. (Patient not taking: Reported on 05/10/2023) 60 tablet 0   amphetamine -dextroamphetamine  (ADDERALL) 20 MG tablet Take 1 tablet (20 mg total) by mouth 2 (two) times daily. (Patient not taking: Reported on 05/10/2023) 60 tablet 0   FLUoxetine  (PROZAC ) 40 MG capsule Take 1 capsule (40 mg total) by mouth daily. 30 capsule 5   loratadine (CLARITIN) 10 MG tablet Take by mouth. (Patient not taking: Reported on 05/10/2023)     LORazepam  (ATIVAN ) 1 MG tablet TAKE 1 TABLET 3 TIMES A DAY AS NEEDED FOR ANXIETY (Patient not taking:  Reported on 05/10/2023) 90 tablet 0   methylphenidate  (RITALIN ) 20 MG tablet Take 1 tablet (20 mg total) by mouth daily. (Patient not taking: Reported on 05/10/2023) 30 tablet 0   metoprolol  tartrate (LOPRESSOR ) 25 MG tablet TAKE 1 TABLET BY MOUTH TWICE A DAY. MAY TAKE AN EXTRA ONE TABLET AS NEEDED  FOR INCREASED HEART RATE. (Patient not taking: Reported on 05/10/2023) 60 tablet 0   midodrine (PROAMATINE) 5 MG tablet Take 1 tablet (5 mg total) by mouth every 8 (eight) hours as needed for up to 21 doses. For systolic blood pressure under 85 mmhg on 2 repeat checks 21 tablet 0   Multiple Vitamin (MULTIVITAMIN WITH MINERALS) TABS tablet Take 1 tablet by mouth daily. (Patient not taking: Reported on 05/10/2023)     ondansetron  (ZOFRAN ) 4 MG tablet Take 1 tablet (4 mg total) by mouth every 6 (six) hours. 12 tablet 0   permethrin  (ELIMITE ) 5 % cream Apply to affected area once 60 g 0   traZODone  (DESYREL ) 100 MG tablet TAKE 1 TO 2 TABLETS BY MOUTH EVERY DAY AT BEDTIME FOR SLEEP 60 tablet 5   No current facility-administered medications for this visit.    Medication Side Effects: None  Allergies:  Allergies  Allergen Reactions   Sulfa Antibiotics Hives, Itching and Swelling   Latex Itching   Polymyxin B     Other reaction(s): eye redness/pain   Polymyxin B-Trimethoprim     Other reaction(s): eye redness/pain    Past Medical History:  Diagnosis Date   Anemia    borderline anemic   Anxiety    Anxiety    Depression    Eczema    GERD (gastroesophageal reflux disease)    with pregnancy   History of hip fracture 10/2010   still has pain, right hip   Hyperemesis arising during pregnancy    Increased heart rate    resting heart rate 100-120   Low energy    on bedrest during pregnancy trying to regain strength   NAFLD (nonalcoholic fatty liver disease)    SVD (spontaneous vaginal delivery) 12/16/2015   SVT (supraventricular tachycardia)    SVT (supraventricular tachycardia)    UTI (lower urinary tract infection)    Vaginitis and vulvovaginitis     Family History  Problem Relation Age of Onset   Alcohol abuse Mother    Anxiety disorder Mother    Asthma Mother    Mental illness Mother        bipolar   Depression Mother    Hypothyroidism Mother    Ovarian cysts Mother     Hypertension Father    Endometriosis Sister    Ovarian cysts Sister    Alcohol abuse Brother    Crohn's disease Brother    Alcohol abuse Maternal Aunt    Cancer Maternal Aunt        breast   Hypothyroidism Maternal Aunt    Miscarriages / Stillbirths Maternal Aunt        stillbirth   Alcohol abuse Maternal Uncle    Asthma Maternal Grandmother    Mental illness Maternal Grandmother        bipolar   Asthma Maternal Grandfather    Diabetes Maternal Grandfather    Hypertension Maternal Grandfather    Asthma Paternal Grandmother    Hypertension Paternal Grandmother    Asthma Paternal Grandfather    Diabetes Paternal Grandfather    Heart disease Paternal Grandfather    Hypertension Paternal Grandfather     Social History  Socioeconomic History   Marital status: Married    Spouse name: Not on file   Number of children: Not on file   Years of education: Not on file   Highest education level: Not on file  Occupational History   Not on file  Tobacco Use   Smoking status: Never   Smokeless tobacco: Never  Vaping Use   Vaping status: Never Used  Substance and Sexual Activity   Alcohol use: Not Currently    Comment: occ   Drug use: No   Sexual activity: Yes    Birth control/protection: None  Other Topics Concern   Not on file  Social History Narrative   Not on file   Social Drivers of Health   Financial Resource Strain: Not on file  Food Insecurity: Not on file  Transportation Needs: Not on file  Physical Activity: Not on file  Stress: Not on file  Social Connections: Unknown (09/06/2021)   Received from [facility]   Social Network    Social Network: Not on file  Intimate Partner Violence: Unknown (08/04/2021)   Received from [facility]   HITS    Physically Hurt: Not on file    Insult or Talk Down To: Not on file    Threaten Physical Harm: Not on file    Scream or Curse: Not on file    Past Medical History, Surgical history, Social history, and Family  history were reviewed and updated as appropriate.   Please see review of systems for further details on the patient's review from today.   Objective:   Physical Exam:  LMP 01/20/2024 (Approximate) Comment: Tubal Ligation  Physical Exam Constitutional:      General: She is not in acute distress. Musculoskeletal:        General: No deformity.  Neurological:     Mental Status: She is alert and oriented to person, place, and time.     Coordination: Coordination normal.  Psychiatric:        Attention and Perception: Attention and perception normal. She does not perceive auditory or visual hallucinations.        Mood and Affect: Mood normal. Mood is not anxious or depressed. Affect is not labile, blunt, angry or inappropriate.        Speech: Speech normal.        Behavior: Behavior normal.        Thought Content: Thought content normal. Thought content is not paranoid or delusional. Thought content does not include homicidal or suicidal ideation. Thought content does not include homicidal or suicidal plan.        Cognition and Memory: Cognition and memory normal.        Judgment: Judgment normal.     Comments: Insight intact     Lab Review:     Component Value Date/Time   NA 136 02/26/2024 1706   K 3.9 02/26/2024 1706   CL 103 02/26/2024 1706   CO2 25 02/26/2024 1706   GLUCOSE 84 02/26/2024 1706   BUN 15 02/26/2024 1706   CREATININE 0.70 02/26/2024 1706   CALCIUM 9.6 02/26/2024 1706   PROT 6.9 02/26/2024 1706   ALBUMIN 4.3 02/26/2024 1706   AST 19 02/26/2024 1706   ALT 29 02/26/2024 1706   ALKPHOS 61 02/26/2024 1706   BILITOT 0.2 02/26/2024 1706   GFRNONAA >60 02/26/2024 1706   GFRAA >60 04/07/2019 1834       Component Value Date/Time   WBC 8.8 02/26/2024 1706   RBC 3.88 02/26/2024  1706   HGB 11.3 (L) 02/26/2024 1706   HCT 34.9 (L) 02/26/2024 1706   PLT 334 02/26/2024 1706   MCV 89.9 02/26/2024 1706   MCH 29.1 02/26/2024 1706   MCHC 32.4 02/26/2024 1706   RDW  13.2 02/26/2024 1706   LYMPHSABS 2.7 02/26/2024 1706   MONOABS 0.9 02/26/2024 1706   EOSABS 0.3 02/26/2024 1706   BASOSABS 0.1 02/26/2024 1706    No results found for: POCLITH, LITHIUM   No results found for: PHENYTOIN, PHENOBARB, VALPROATE, CBMZ   .res Assessment: Plan:    Plan:  Trazadone 100mg  - taking 1 at hs as needed for sleep Prozac  40mg  daily  Xanax  1mg  BID for anxiety/panic  Monitor BP between visits   Reports seizure history  Continue therapy   RTC 3 months  25 minutes spent dedicated to the care of this patient on the date of this encounter to include pre-visit review of records, ordering of medication, post visit documentation, and face-to-face time with the patient discussing GAD, MDD, insomnia, obsessional thoughts and ADHD. Discussed continuing current medication regimen.  Discussed potential benefits, risk, and side effects of benzodiazepines to include potential risk of tolerance and dependence, as well as possible drowsiness. Advised patient not to drive if experiencing drowsiness and to take lowest possible effective dose to minimize risk of dependence and tolerance.  Discussed potential benefits, risks, and side effects of stimulants with patient to include increased heart rate, palpitations, insomnia, increased anxiety, increased irritability, or decreased appetite. Instructed patient to contact office if experiencing any significant tolerability issues.  There are no diagnoses linked to this encounter.   Please see After Visit Summary for patient specific instructions.  Future Appointments  Date Time Provider Department Center  03/08/2024  4:00 PM Mariaceleste Herrera Nattalie, NP [facility] None    No orders of the defined types were placed in this encounter.     -------------------------------

## 2024-04-05 ENCOUNTER — Telehealth: Payer: Self-pay | Admitting: Adult Health

## 2024-04-05 NOTE — Telephone Encounter (Signed)
 Finally got thru to pharmacy. She does have a RF available and I asked them to fill it, said they would fill now. Notified patient. She is aware insurance will not cover. Told her to use [facility].

## 2024-04-05 NOTE — Telephone Encounter (Signed)
 Pt lost Prozac  script after thanksgiving. Wanting to get another script. Not sure how much she had left. Is willing to pay out of pocket.     [facility] [address]

## 2024-04-05 NOTE — Telephone Encounter (Signed)
 I told patient she should have a RF available and she said that she was told they couldn't fill it yet. Tried several times to call pharmacy and keep getting the provider line message. LVM.
